# Patient Record
Sex: Female | Born: 1940 | Race: Black or African American | Hispanic: No | Marital: Single | Smoking: Never smoker
Health system: Southern US, Community
[De-identification: ages and names within clinical notes are randomized; demographics above are authoritative.]

## PROBLEM LIST (undated history)

## (undated) DIAGNOSIS — I10 Essential (primary) hypertension: Secondary | ICD-10-CM

## (undated) DIAGNOSIS — M1990 Unspecified osteoarthritis, unspecified site: Secondary | ICD-10-CM

## (undated) DIAGNOSIS — E785 Hyperlipidemia, unspecified: Secondary | ICD-10-CM

## (undated) DIAGNOSIS — E079 Disorder of thyroid, unspecified: Secondary | ICD-10-CM

## (undated) DIAGNOSIS — M810 Age-related osteoporosis without current pathological fracture: Secondary | ICD-10-CM

## (undated) DIAGNOSIS — M1A9XX0 Chronic gout, unspecified, without tophus (tophi): Secondary | ICD-10-CM

## (undated) HISTORY — DX: Age-related osteoporosis without current pathological fracture: M81.0

## (undated) HISTORY — DX: Essential (primary) hypertension: I10

## (undated) HISTORY — DX: Unspecified osteoarthritis, unspecified site: M19.90

## (undated) HISTORY — DX: Hyperlipidemia, unspecified: E78.5

## (undated) HISTORY — DX: Disorder of thyroid, unspecified: E07.9

## (undated) HISTORY — DX: Chronic gout, unspecified, without tophus (tophi): M1A.9XX0

---

## 2002-03-26 HISTORY — PX: LUNG SURGERY: SHX703

## 2010-10-21 ENCOUNTER — Emergency Department: Payer: Self-pay | Admitting: Internal Medicine

## 2013-02-06 DIAGNOSIS — E039 Hypothyroidism, unspecified: Secondary | ICD-10-CM | POA: Insufficient documentation

## 2013-02-06 DIAGNOSIS — E038 Other specified hypothyroidism: Secondary | ICD-10-CM | POA: Insufficient documentation

## 2013-07-09 ENCOUNTER — Emergency Department: Payer: Self-pay | Admitting: Emergency Medicine

## 2013-07-09 LAB — URINALYSIS, COMPLETE
Blood: NEGATIVE
Glucose,UR: NEGATIVE mg/dL (ref 0–75)
Hyaline Cast: 125
Leukocyte Esterase: NEGATIVE
Nitrite: NEGATIVE
Ph: 5 (ref 4.5–8.0)
Protein: 100
RBC,UR: 2 /HPF (ref 0–5)
Specific Gravity: 1.026 (ref 1.003–1.030)
Squamous Epithelial: 30
WBC UR: 11 /HPF (ref 0–5)

## 2013-07-10 LAB — URINE CULTURE

## 2013-08-21 ENCOUNTER — Inpatient Hospital Stay: Payer: Self-pay | Admitting: Internal Medicine

## 2013-08-21 LAB — CBC WITH DIFFERENTIAL/PLATELET
Bands: 2 %
Comment - H1-Com1: NORMAL
Comment - H1-Com2: NORMAL
HCT: 40.7 % (ref 35.0–47.0)
HGB: 12.6 g/dL (ref 12.0–16.0)
Lymphocytes: 9 %
MCH: 25.3 pg — ABNORMAL LOW (ref 26.0–34.0)
MCHC: 30.9 g/dL — ABNORMAL LOW (ref 32.0–36.0)
MCV: 82 fL (ref 80–100)
Monocytes: 10 %
Platelet: 406 10*3/uL (ref 150–440)
RBC: 4.97 10*6/uL (ref 3.80–5.20)
RDW: 15.1 % — ABNORMAL HIGH (ref 11.5–14.5)
Segmented Neutrophils: 79 %
WBC: 23.5 10*3/uL — ABNORMAL HIGH (ref 3.6–11.0)

## 2013-08-21 LAB — URINALYSIS, COMPLETE
Bilirubin,UR: NEGATIVE
Blood: NEGATIVE
Glucose,UR: NEGATIVE mg/dL (ref 0–75)
Hyaline Cast: 10
Ketone: NEGATIVE
Leukocyte Esterase: NEGATIVE
Nitrite: NEGATIVE
Ph: 5 (ref 4.5–8.0)
Protein: NEGATIVE
RBC,UR: 24 /HPF (ref 0–5)
Specific Gravity: 1.018 (ref 1.003–1.030)
Squamous Epithelial: 3
WBC UR: 12 /HPF (ref 0–5)

## 2013-08-21 LAB — COMPREHENSIVE METABOLIC PANEL
Albumin: 2.5 g/dL — ABNORMAL LOW (ref 3.4–5.0)
Alkaline Phosphatase: 97 U/L
Anion Gap: 15 (ref 7–16)
BUN: 81 mg/dL — ABNORMAL HIGH (ref 7–18)
Bilirubin,Total: 1.1 mg/dL — ABNORMAL HIGH (ref 0.2–1.0)
Calcium, Total: 10.3 mg/dL — ABNORMAL HIGH (ref 8.5–10.1)
Chloride: 105 mmol/L (ref 98–107)
Co2: 21 mmol/L (ref 21–32)
Creatinine: 1.85 mg/dL — ABNORMAL HIGH (ref 0.60–1.30)
EGFR (African American): 31 — ABNORMAL LOW
EGFR (Non-African Amer.): 27 — ABNORMAL LOW
Glucose: 173 mg/dL — ABNORMAL HIGH (ref 65–99)
Osmolality: 310 (ref 275–301)
Potassium: 3.8 mmol/L (ref 3.5–5.1)
SGOT(AST): 43 U/L — ABNORMAL HIGH (ref 15–37)
SGPT (ALT): 40 U/L (ref 12–78)
Sodium: 141 mmol/L (ref 136–145)
Total Protein: 8 g/dL (ref 6.4–8.2)

## 2013-08-21 LAB — CK TOTAL AND CKMB (NOT AT ARMC)
CK, Total: 442 U/L — ABNORMAL HIGH
CK-MB: 1.5 ng/mL (ref 0.5–3.6)

## 2013-08-21 LAB — APTT: Activated PTT: 25.4 secs (ref 23.6–35.9)

## 2013-08-21 LAB — PROTIME-INR
INR: 1.1
Prothrombin Time: 14.3 secs (ref 11.5–14.7)

## 2013-08-21 LAB — TROPONIN I: Troponin-I: <0.02 ng/mL

## 2013-08-22 LAB — CBC WITH DIFFERENTIAL/PLATELET
Basophil #: 0 10*3/uL (ref 0.0–0.1)
Basophil %: 0.2 %
Eosinophil #: 0.1 10*3/uL (ref 0.0–0.7)
Eosinophil %: 0.3 %
HCT: 33.3 % — ABNORMAL LOW (ref 35.0–47.0)
HGB: 10.4 g/dL — ABNORMAL LOW (ref 12.0–16.0)
Lymphocyte #: 2.4 10*3/uL (ref 1.0–3.6)
Lymphocyte %: 13.4 %
MCH: 25.3 pg — ABNORMAL LOW (ref 26.0–34.0)
MCHC: 31.2 g/dL — ABNORMAL LOW (ref 32.0–36.0)
MCV: 81 fL (ref 80–100)
Monocyte #: 1.9 x10 3/mm — ABNORMAL HIGH (ref 0.2–0.9)
Monocyte %: 10.4 %
Neutrophil #: 13.5 10*3/uL — ABNORMAL HIGH (ref 1.4–6.5)
Neutrophil %: 75.7 %
Platelet: 312 10*3/uL (ref 150–440)
RBC: 4.11 10*6/uL (ref 3.80–5.20)
RDW: 14.7 % — ABNORMAL HIGH (ref 11.5–14.5)
WBC: 17.8 10*3/uL — ABNORMAL HIGH (ref 3.6–11.0)

## 2013-08-22 LAB — BASIC METABOLIC PANEL
Anion Gap: 8 (ref 7–16)
BUN: 55 mg/dL — ABNORMAL HIGH (ref 7–18)
Calcium, Total: 9 mg/dL (ref 8.5–10.1)
Chloride: 116 mmol/L — ABNORMAL HIGH (ref 98–107)
Co2: 23 mmol/L (ref 21–32)
Creatinine: 1.23 mg/dL (ref 0.60–1.30)
EGFR (African American): 51 — ABNORMAL LOW
EGFR (Non-African Amer.): 44 — ABNORMAL LOW
Glucose: 100 mg/dL — ABNORMAL HIGH (ref 65–99)
Osmolality: 308 (ref 275–301)
Potassium: 3.6 mmol/L (ref 3.5–5.1)
Sodium: 147 mmol/L — ABNORMAL HIGH (ref 136–145)

## 2013-08-22 LAB — LIPID PANEL
Cholesterol: 105 mg/dL (ref 0–200)
HDL Cholesterol: 19 mg/dL — ABNORMAL LOW (ref 40–60)
Ldl Cholesterol, Calc: 62 mg/dL (ref 0–100)
Triglycerides: 122 mg/dL (ref 0–200)
VLDL Cholesterol, Calc: 24 mg/dL (ref 5–40)

## 2013-08-22 LAB — TSH: Thyroid Stimulating Horm: 1.42 u[IU]/mL

## 2013-08-22 LAB — HEMOGLOBIN A1C: Hemoglobin A1C: 6.1 % (ref 4.2–6.3)

## 2013-08-23 LAB — BASIC METABOLIC PANEL
Anion Gap: 8 (ref 7–16)
BUN: 23 mg/dL — ABNORMAL HIGH (ref 7–18)
Calcium, Total: 8.8 mg/dL (ref 8.5–10.1)
Chloride: 109 mmol/L — ABNORMAL HIGH (ref 98–107)
Co2: 24 mmol/L (ref 21–32)
Creatinine: 1.06 mg/dL (ref 0.60–1.30)
EGFR (African American): >60
EGFR (Non-African Amer.): 53 — ABNORMAL LOW
Glucose: 103 mg/dL — ABNORMAL HIGH (ref 65–99)
Osmolality: 285 (ref 275–301)
Potassium: 3.1 mmol/L — ABNORMAL LOW (ref 3.5–5.1)
Sodium: 141 mmol/L (ref 136–145)

## 2013-08-23 LAB — URINE CULTURE

## 2013-08-24 LAB — BASIC METABOLIC PANEL
Anion Gap: 4 — ABNORMAL LOW (ref 7–16)
BUN: 9 mg/dL (ref 7–18)
Calcium, Total: 8.6 mg/dL (ref 8.5–10.1)
Chloride: 108 mmol/L — ABNORMAL HIGH (ref 98–107)
Co2: 26 mmol/L (ref 21–32)
Creatinine: 0.95 mg/dL (ref 0.60–1.30)
EGFR (African American): >60
EGFR (Non-African Amer.): >60
Glucose: 107 mg/dL — ABNORMAL HIGH (ref 65–99)
Osmolality: 275 (ref 275–301)
Potassium: 3.5 mmol/L (ref 3.5–5.1)
Sodium: 138 mmol/L (ref 136–145)

## 2013-08-24 LAB — MAGNESIUM: Magnesium: 1.5 mg/dL — ABNORMAL LOW

## 2013-08-26 LAB — WOUND CULTURE

## 2013-08-26 LAB — CULTURE, BLOOD (SINGLE)

## 2013-08-30 LAB — WOUND CULTURE

## 2013-08-31 ENCOUNTER — Other Ambulatory Visit: Payer: Self-pay | Admitting: Family Medicine

## 2013-08-31 LAB — CBC WITH DIFFERENTIAL/PLATELET
Basophil #: 0.1 10*3/uL (ref 0.0–0.1)
Basophil %: 0.5 %
Eosinophil #: 0.2 10*3/uL (ref 0.0–0.7)
Eosinophil %: 2.1 %
HCT: 38.1 % (ref 35.0–47.0)
HGB: 11.7 g/dL — ABNORMAL LOW (ref 12.0–16.0)
Lymphocyte #: 2.5 10*3/uL (ref 1.0–3.6)
Lymphocyte %: 23.4 %
MCH: 26.3 pg (ref 26.0–34.0)
MCHC: 30.7 g/dL — ABNORMAL LOW (ref 32.0–36.0)
MCV: 86 fL (ref 80–100)
Monocyte #: 0.8 x10 3/mm (ref 0.2–0.9)
Monocyte %: 7.9 %
Neutrophil #: 6.9 10*3/uL — ABNORMAL HIGH (ref 1.4–6.5)
Neutrophil %: 66.1 %
Platelet: 180 10*3/uL (ref 150–440)
RBC: 4.45 10*6/uL (ref 3.80–5.20)
RDW: 17.2 % — ABNORMAL HIGH (ref 11.5–14.5)
WBC: 10.5 10*3/uL (ref 3.6–11.0)

## 2013-08-31 LAB — COMPREHENSIVE METABOLIC PANEL
Albumin: 2.7 g/dL — ABNORMAL LOW (ref 3.4–5.0)
Alkaline Phosphatase: 81 U/L
Anion Gap: 9 (ref 7–16)
BUN: 7 mg/dL (ref 7–18)
Bilirubin,Total: 0.5 mg/dL (ref 0.2–1.0)
Calcium, Total: 8.8 mg/dL (ref 8.5–10.1)
Chloride: 106 mmol/L (ref 98–107)
Co2: 25 mmol/L (ref 21–32)
Creatinine: 0.95 mg/dL (ref 0.60–1.30)
EGFR (African American): >60
EGFR (Non-African Amer.): >60
Glucose: 136 mg/dL — ABNORMAL HIGH (ref 65–99)
Osmolality: 279 (ref 275–301)
Potassium: 3.9 mmol/L (ref 3.5–5.1)
SGOT(AST): 31 U/L (ref 15–37)
SGPT (ALT): 30 U/L (ref 12–78)
Sodium: 140 mmol/L (ref 136–145)
Total Protein: 6.5 g/dL (ref 6.4–8.2)

## 2013-08-31 LAB — URINALYSIS, COMPLETE
Bilirubin,UR: NEGATIVE
Blood: NEGATIVE
Glucose,UR: NEGATIVE mg/dL (ref 0–75)
Hyaline Cast: 5
Ketone: NEGATIVE
Leukocyte Esterase: NEGATIVE
Nitrite: NEGATIVE
Ph: 5 (ref 4.5–8.0)
Protein: NEGATIVE
RBC,UR: 3 /HPF (ref 0–5)
Specific Gravity: 1.008 (ref 1.003–1.030)
Squamous Epithelial: 6
WBC UR: 5 /HPF (ref 0–5)

## 2013-08-31 LAB — HEMOGLOBIN A1C: Hemoglobin A1C: 5.6 % (ref 4.2–6.3)

## 2013-08-31 LAB — MAGNESIUM: Magnesium: 1.8 mg/dL

## 2013-09-02 LAB — URINE CULTURE

## 2013-09-15 ENCOUNTER — Other Ambulatory Visit: Payer: Self-pay

## 2013-09-15 LAB — COMPREHENSIVE METABOLIC PANEL
Albumin: 3 g/dL — ABNORMAL LOW (ref 3.4–5.0)
Alkaline Phosphatase: 82 U/L
Anion Gap: 11 (ref 7–16)
BUN: 10 mg/dL (ref 7–18)
Bilirubin,Total: 0.5 mg/dL (ref 0.2–1.0)
Calcium, Total: 8.9 mg/dL (ref 8.5–10.1)
Chloride: 108 mmol/L — ABNORMAL HIGH (ref 98–107)
Co2: 23 mmol/L (ref 21–32)
Creatinine: 0.89 mg/dL (ref 0.60–1.30)
EGFR (African American): >60
EGFR (Non-African Amer.): >60
Glucose: 123 mg/dL — ABNORMAL HIGH (ref 65–99)
Osmolality: 284 (ref 275–301)
Potassium: 3.7 mmol/L (ref 3.5–5.1)
SGOT(AST): 22 U/L (ref 15–37)
SGPT (ALT): 20 U/L (ref 12–78)
Sodium: 142 mmol/L (ref 136–145)
Total Protein: 6.3 g/dL — ABNORMAL LOW (ref 6.4–8.2)

## 2013-09-15 LAB — MAGNESIUM: Magnesium: 2.2 mg/dL

## 2013-09-15 LAB — URIC ACID: Uric Acid: 10.2 mg/dL — ABNORMAL HIGH (ref 2.6–6.0)

## 2014-03-26 HISTORY — PX: COLONOSCOPY: SHX174

## 2014-03-26 LAB — HM PAP SMEAR: HM Pap smear: NORMAL

## 2014-05-07 LAB — CBC AND DIFFERENTIAL
HCT: 43 % (ref 36–46)
Hemoglobin: 13.9 g/dL (ref 12.0–16.0)
Neutrophils Absolute: 67 /uL
Platelets: 196 10*3/uL (ref 150–399)
WBC: 8.9 10^3/mL

## 2014-05-07 LAB — HEPATIC FUNCTION PANEL
ALT: 11 U/L (ref 7–35)
AST: 19 U/L (ref 13–35)
Alkaline Phosphatase: 86 U/L (ref 25–125)
Bilirubin, Total: 0.7 mg/dL

## 2014-05-07 LAB — BASIC METABOLIC PANEL
BUN: 15 mg/dL (ref 4–21)
Creatinine: 0.9 mg/dL (ref 0.5–1.1)
Glucose: 123 mg/dL
Potassium: 4.2 mmol/L (ref 3.4–5.3)
Sodium: 141 mmol/L (ref 137–147)

## 2014-05-07 LAB — HEMOGLOBIN A1C: Hgb A1c MFr Bld: 5.6 % (ref 4.0–6.0)

## 2014-05-07 LAB — TSH: TSH: 3.18 u[IU]/mL (ref 0.41–5.90)

## 2014-06-02 ENCOUNTER — Ambulatory Visit: Payer: Self-pay | Admitting: Family Medicine

## 2014-06-02 LAB — HM MAMMOGRAPHY: HM Mammogram: NORMAL

## 2014-06-02 LAB — HM DEXA SCAN: HM Dexa Scan: -2.9

## 2014-07-12 DIAGNOSIS — K5909 Other constipation: Secondary | ICD-10-CM

## 2014-07-12 DIAGNOSIS — L853 Xerosis cutis: Secondary | ICD-10-CM | POA: Insufficient documentation

## 2014-07-12 DIAGNOSIS — E041 Nontoxic single thyroid nodule: Secondary | ICD-10-CM | POA: Insufficient documentation

## 2014-07-12 DIAGNOSIS — H5789 Other specified disorders of eye and adnexa: Secondary | ICD-10-CM | POA: Insufficient documentation

## 2014-07-12 DIAGNOSIS — B372 Candidiasis of skin and nail: Secondary | ICD-10-CM

## 2014-07-12 DIAGNOSIS — M1A00X0 Idiopathic chronic gout, unspecified site, without tophus (tophi): Secondary | ICD-10-CM | POA: Insufficient documentation

## 2014-07-12 DIAGNOSIS — I10 Essential (primary) hypertension: Secondary | ICD-10-CM | POA: Insufficient documentation

## 2014-07-12 DIAGNOSIS — R29898 Other symptoms and signs involving the musculoskeletal system: Secondary | ICD-10-CM

## 2014-07-12 DIAGNOSIS — E040 Nontoxic diffuse goiter: Secondary | ICD-10-CM

## 2014-07-12 DIAGNOSIS — M810 Age-related osteoporosis without current pathological fracture: Secondary | ICD-10-CM | POA: Insufficient documentation

## 2014-07-12 DIAGNOSIS — E049 Nontoxic goiter, unspecified: Secondary | ICD-10-CM

## 2014-07-13 ENCOUNTER — Ambulatory Visit
Admit: 2014-07-13 | Disposition: A | Discharge status: Home / Self Care | Payer: Self-pay | Attending: Gastroenterology | Admitting: Gastroenterology

## 2014-07-17 NOTE — Discharge Summary (Signed)
PATIENT NAME:  Tracy Bolton, Tracy Bolton MR#:  [id number] DATE OF BIRTH:  1940-11-05  DATE OF ADMISSION:  08/21/2013 DATE OF DISCHARGE:  08/24/2013  ADMISSION DIAGNOSIS:  1.  Sepsis secondary to underlying skin infection.  2.  Acute kidney injury.  3.  Yeast infection.   DISCHARGE DIAGNOSES:  1.  Sepsis from superimposed infection on top of candida.  2.  Acute kidney injury. 3.  Yeast infection.  4.  Elevated blood sugars.  5.  Rhabdomyolysis, mild, after falling.  6.  Hypernatremia, improved.  7.  Weakness/falls.  8.  Hypomagnesemia.   LABORATORY: Sodium 138, potassium 3.5, chloride 108, bicarbonate 26, BUN 9, creatinine 0.95. Wound cultures are so far positive for gram-negative rods, which the lab says is pseudomonas and anaerobes. Blood cultures negative.   HOSPITAL COURSE: A 74 year old female, who was found by EMS after a fall unable to get up. For further details, please refer to the H and P.  1.  Sepsis. The patient has primary source of superimposed infection on top of Candida infection. The criteria for sepsis on admission was tachycardia and elevation of white blood cell count with this infection. She was placed on Rocephin and fluconazole. She did well with this. Her white blood cell count improved. She was afebrile throughout hospitalization. Cultures were positive for pseudomonas and some anaerobes. She will be discharged with fluoroquinolone as well as clindamycin.  2.  Acute kidney injury, improved with IV fluids. 3.  Yeast infection. The patient was on Diflucan. She will continue with nystatin powder.  4.  Elevated blood sugar with A1c of 6.1. She is not diabetic, but may meet standards for pre-diabetes and will need close followup.  5.  Rhabdomyolysis, mild. Mild elevation of CK on admission. She was hydrated and this is improved.  6.  Hypernatremia. The patient was not taking adequate p.o. She was placed on D5. Sodium is improved.  7.  Weakness and falls. The patient does need  rehab at discharge.  8.  Hypomagnesemia, which was repleted prior to discharge.   DISCHARGE MEDICATIONS:  1.  Atenolol 100 mg daily. 2.  Ciprofloxacin 500 mg q.12 hours.  3.  Cleomycin 300 mg q.8 hours, stop on 06/10.  4.  Nystatin 1 application t.i.d. 5.  Senna 1 tablet b.i.d.  6.  Ensure b.i.d.  We have stopped the patient's amlodipine and lisinopril due to low-normal blood pressures.   DISCHARGE DIET: Regular diet.   DISCHARGE ACTIVITY: As tolerated.   DISCHARGE DIETARY SUPPLEMENT: Ensure b.i.d.   DISCHARGE REFERRAL: Physical therapy.  DISCHARGE COMMENTS: Check blood pressure daily. Adjust medications accordingly. Stop antibiotics on 09/02/2013.   FOLLOWUP: The patient needs to follow up with the M.D. at the facility.   CONDITION: The patient is medically stable for discharge to rehab and is medically ready.   TIME SPENT: 45 minutes on discharge.   ____________________________ Sital P. Benjie Karvonen, MD spm:aw D: 08/24/2013 11:26:21 ET T: 08/24/2013 11:33:26 ET JOB#: [id number]  cc: Sital P. Benjie Karvonen, MD, <Dictator> Donell Beers MODY MD ELECTRONICALLY SIGNED 08/24/2013 12:13

## 2014-07-17 NOTE — H&P (Signed)
PATIENT NAME:  Tracy Bolton, DEGRAZIA MR#:  [id number] DATE OF BIRTH:  [birth year]  DATE OF ADMISSION:  08/21/2013  PRIMARY CARE PHYSICIAN: Nonlocal.   REFERRING PHYSICIAN:  Dr. Darl Householder.   CHIEF COMPLAINT:  Fall, unable to get up.   HISTORY OF PRESENT ILLNESS: This is a very nice 74 year old female with history of hypertension and obesity. She had a urinary tract infection treated a couple of weeks ago, but overall she has been in her normal state of health. The patient comes today with a history of falling on Tuesday and having difficulty getting up, and once she got up, she was able to ambulate. Then, last night she fell in the early evening. She was not able to get up because she felt too weak and stent all night on the floor, and this morning woke up still on the floor. She was not able to move until the police arrived to her apartment.   Apparently, her daughter called the police as she calling her on the phone and she never answered. The patient states that she has been okay. She denies seeing any rashes on her skin, but she has multiple areas under the breasts and in between the groins secondary to intertrigo. The patient says that she is very good with her hygiene and always takes a shower everyday, but still has never noticed any infection. Never noticed any significant body odor as well. The patient comes to the Emergency Department where she is found out to be hyperglycemic with acute kidney injury and total CK of 442. White count of 23,000. The patient meets criteria for sepsis based on tachycardia and white blood cells, but she is afebrile. The patient is admitted for treatment of this condition.   REVIEW OF SYSTEMS:  A 12 system review of systems is done. CONSTITUTIONAL:  No fever. Positive weakness. Negative significant fatigue, weight loss or weight gain. The patient is morbidly obese.  EYES: No blurry vision, double vision.  ENT:  No difficulty swallowing or tinnitus.  RESPIRATORY: No cough,  shortness of breath, wheezing or COPD.  CARDIOVASCULAR: No chest pain, orthopnea, edema or syncope.  GASTROINTESTINAL: No nausea, vomiting, abdominal pain, constipation or diarrhea.  GENITOURINARY: No dysuria or hematuria.  ENDOCRINE: No polyuria, polydipsia, polyphagia, cold or heat intolerance. The patient has elevated blood sugars, but she was not aware of being diabetic.  HEMATOLOGIC AND LYMPHATICS: No anemia, easy bruising or bleeding.  SKIN: The patient denies any lesions or infections of the skin.  MUSCULOSKELETAL: No neck pain, back pain, shoulder pain or gout.  NEUROLOGIC: No numbness, tingling or CVA.  PSYCHIATRIC:  No insomnia or depression.  PAST MEDICAL HISTORY: 1.  Hypertension.  2.  Obesity.  3.  Recent diagnosis of UTI, but no recurring UTIs.   ALLERGIES: No known drug allergies.   PAST SURGICAL HISTORY:  Lung biopsy, which was for a nodule that was benign.   FAMILY HISTORY: Negative for MI, CVA, cancer or diabetes.   SOCIAL HISTORY: The patient never smoked. Does not drink. She lives alone. She is divorced. She is retired from Teaching laboratory technician work.   CURRENT MEDICATIONS: Include lisinopril 4 mg daily, atenolol 100 mg once a day, amlodipine 10 mg once a day.   PHYSICAL EXAMINATION:  VITAL SIGNS: Blood pressure 107/64, pulse 114, respirations 16, temperature 97.7, oxygen saturation 98% on room air.  GENERAL:  The patient is alert and oriented x 3, in no acute distress. No respiratory distress. Hemodynamically stable.  HEENT: Pupils are equal, round  and reactive. Extraocular movements are intact. Mucosa is dry. Anicteric sclerae. Pink conjunctivae. No oral lesions. No oropharyngeal exudates.  NECK: Supple. No JVD. No thyromegaly. No adenopathy. No carotid bruits.  CARDIOVASCULAR: Regular rate and rhythm, tachycardic. No murmurs, rubs or gallops are appreciated. No tenderness to palpation on anterior chest wall. No displacement of PMI.  LUNGS: Clear without any wheezing or  crepitus. No use of accessory muscles.  ABDOMEN: Soft, nontender, nondistended. No hepatosplenomegaly. No masses. Bowel sounds are positive. No tenderness to palpation. No rebound.   GENITAL: Negative for vaginal secretions. The patient has groin infection, significant yeast infection with significant erythema at the level of the groin and under the breasts with small ulcerations and suppuration  of fluid that looks purulent. No significant abscess.  EXTREMITIES:  No edema, cyanosis or clubbing. Pulses +2. Capillary refill less than 3.  SKIN: As mentioned above.  NEUROLOGIC:  Cranial nerves II through XII intact. Strength is 4/5 in all 4 extremities. No focal findings. DTRs +2.  LYMPHATIC: Negative for lymphadenopathy in supraclavicular areas.  PSYCHIATRIC: No agitation. The patient is alert and oriented x 3.  MUSCULOSKELETAL: No significant joint effusion or joint swelling.   CHEST X-RAY:  Mass deviating the trachea, but no acute disease.   The patient had a normal CT of the head. The chest x-ray mass appears to be a goiter.   HIP: No acute fractures.  KNEE X-RAYS:  No acute abnormality.   LEFT FOOT X-RAY:  Mild soft tissue swelling without any acute abnormality.  EKG: No ST depression or elevation. The patient has normal sinus rhythm, but has prolonged QT at 398   LABORATORY DATA:  White blood count of 23,000, total CK of 442, bilirubin 1.1, AST 43, albumin 2.5, calcium 10.3, creatinine 1.85, glucose 173, BUN 81.   ASSESSMENT AND PLAN: A 74 year old female status post fall, history of hypertension, obesity, admitted for sepsis.  1.  Sepsis. The patient has primary source of superimposed infection on top of candidal infection. Criteria of sepsis, tachycardic at 116 and a white count of 23,000. The patient also has an elevation of white blood count secondary to vascular compression as she has been lying down on the floor for over 24 hours. Treated with Rocephin and Diflucan. Cultures taken  of blood. Cultures taken from the wounds. She also had urinary white blood cells of 12 and 24 on the UA. Rule out urinary tract infection as well.  2.  As far as her fall, the patient is debilitated, likely is going to need some type of rehab. She has a slight case of rhabdomyolysis with a CK around 500 and acute kidney  injury.  3.  Acute kidney injury. Her creatinine is 1.85. At this moment, her blood pressure is borderline low 416S to 063 on systolics. We are going to hold her blood pressure medications, especially  lisinopril since her creatinine is elevated. If no improvement of the creatinine, consider imaging of the abdomen to rule out postobstructive problems.  4.  Yeast infection. This patient is likely diabetic, undiagnosed. We are going to treat her with Diflucan.  5.  Elevated blood sugars, likely undiagnosed diabetic. Hemoglobin A1c checked. Insulin sliding scale. Start on medications after kidney failure resolves. Possible metformin will be a good medication for her.  6.  Hyperglycemia due to vascular concentration, dehydration. Continue to monitor.  7.  Elevation of AST. Again, this could be secondary to the rhabdomyolysis and affecting the liver.  8.  Hypertension. Hold blood  pressure medications.  9.  Gastrointestinal prophylaxis with Protonix.  10.  Deep vein thrombosis prophylaxis with Lovenox.  11. Mass at the level of the neck, likely goiter. Get a CT scan outpatient or before the patient has been discharged. At this moment, the patient is asymptomatic and has been a finding that she has had for several years apparently.  12.  Other medical problems are stable.    ____________________________ Montgomery Sink, MD rsg:dmm D: 08/21/2013 18:31:33 ET T: 08/21/2013 19:23:49 ET JOB#: [id number]  cc:  Sink, MD, <Dictator> ROBERTO America Brown MD ELECTRONICALLY SIGNED 08/29/2013 20:10

## 2014-07-19 LAB — SURGICAL PATHOLOGY

## 2014-09-06 ENCOUNTER — Encounter (INDEPENDENT_AMBULATORY_CARE_PROVIDER_SITE_OTHER): Payer: Self-pay

## 2014-09-06 ENCOUNTER — Ambulatory Visit (INDEPENDENT_AMBULATORY_CARE_PROVIDER_SITE_OTHER): Payer: Medicare Other | Admitting: Family Medicine

## 2014-09-06 ENCOUNTER — Encounter: Payer: Self-pay | Admitting: Family Medicine

## 2014-09-06 VITALS — BP 122/74 | HR 82 | Temp 98.8°F | Resp 18 | Ht 63.0 in | Wt 244.4 lb

## 2014-09-06 DIAGNOSIS — M1A9XX0 Chronic gout, unspecified, without tophus (tophi): Secondary | ICD-10-CM | POA: Insufficient documentation

## 2014-09-06 DIAGNOSIS — I10 Essential (primary) hypertension: Secondary | ICD-10-CM | POA: Diagnosis not present

## 2014-09-06 DIAGNOSIS — B372 Candidiasis of skin and nail: Secondary | ICD-10-CM

## 2014-09-06 DIAGNOSIS — M1A00X0 Idiopathic chronic gout, unspecified site, without tophus (tophi): Secondary | ICD-10-CM | POA: Insufficient documentation

## 2014-09-06 DIAGNOSIS — M810 Age-related osteoporosis without current pathological fracture: Secondary | ICD-10-CM | POA: Diagnosis not present

## 2014-09-06 MED ORDER — NYAMYC 100000 UNIT/GM EX POWD: CUTANEOUS | Status: DC

## 2014-09-06 NOTE — Progress Notes (Signed)
Name: Tracy Bolton   MRN: [id number]    DOB: [birth year]   Date:09/06/2014       Progress Note  Subjective  Chief Complaint  Chief Complaint  Patient presents with  . Osteoporosis    patient is here for a follow-up   . Hypertension    HPI  Patient is here for routine follow up of Hypertension. First diagnosed with hypertension several years ago. Current anti-hypertension medication regimen includes dietary modification, weight management and amlodipine 10mg  one a day, lisinopril 40mg  one a day. atient is following physician recommended management. No checking blood pressure outside of physician office. Associated symptoms do not include headache, dizziness, nausea, lower extremity swelling, shortness of breath, chest pain, numbness.  Osteoporosis: Patient complains of osteoporosis. She was diagnosed with osteoporosis by bone density scan in 06/02/2014 denies history of fracture.The cause of osteoporosis is felt to be due to postmenopausal estrogen deficiency and dietary calcium and/or vitamin D deficiency.   She is currently being treated with calcium and vitamin D supplementation.  She is currently being treated with bisphosphonates.  Skin Rash Improving with nystatin powder use. Needs refill of larger size.   Past Medical History  Diagnosis Date  . Hyperlipidemia   . Arthritis   . Hypertension   . Thyroid disease   . Osteoporosis   . Gout, chronic     Past Surgical History  Procedure Laterality Date  . Lung surgery Right 2004    biopsy normal    Family History  Problem Relation Age of Onset  . Bone cancer Father   . Thyroid disease Sister     History   Social History  . Marital Status: Single    Spouse Name: N/A  . Number of Children: 2  . Years of Education: N/A   Occupational History  . retired    Social History Main Topics  . Smoking status: Never Smoker   . Smokeless tobacco: Not on file  . Alcohol Use: No  . Drug Use: No  . Sexual Activity: No    Other Topics Concern  . Not on file   Social History Narrative     Current outpatient prescriptions:  .  alendronate (FOSAMAX) 70 MG tablet, Take 70 mg by mouth., Disp: , Rfl:  .  allopurinol (ZYLOPRIM) 100 MG tablet, Take by mouth., Disp: , Rfl:  .  amLODipine (NORVASC) 10 MG tablet, Take 10 mg by mouth., Disp: , Rfl:  .  aspirin 81 MG tablet, Take 81 mg by mouth., Disp: , Rfl:  .  Calcium Carbonate-Vitamin D 600-400 MG-UNIT per tablet, Take by mouth., Disp: , Rfl:  .  Cholecalciferol (VITAMIN D3) 50000 UNITS CAPS, Take by mouth., Disp: , Rfl:  .  docusate sodium (COLACE) 100 MG capsule, Take by mouth., Disp: , Rfl:  .  lisinopril (PRINIVIL,ZESTRIL) 40 MG tablet, Take 40 mg by mouth., Disp: , Rfl:  .  Multiple Vitamin (MULTI-VITAMINS) TABS, Take by mouth., Disp: , Rfl:  .  Nystatin ([place]) 100000 UNIT/GM POWD, [place], 100000 UNIT/GM (External Powder)  1 (one) Powder three times daily to affected areas of skin for 30 days  Quantity: 1;  Refills: 2   Ordered :07-May-2014  Bobetta Lime MD;  Started 07-May-2014 Active, Disp: , Rfl:  .  senna (SENOKOT) 8.6 MG tablet, Take 1 tablet by mouth 2 (two) times daily., Disp: , Rfl:  .  senna (SENOKOT) 8.6 MG tablet, Take by mouth., Disp: , Rfl:  .  [place], See admin  instructions., Disp: , Rfl: 0 .  atenolol (TENORMIN) 50 MG tablet, Take 50 mg by mouth daily., Disp: , Rfl: 1  No Known Allergies   ROS  CONSTITUTIONAL: No significant weight changes, fever, chills, weakness or fatigue.  HEENT:  - Eyes: No visual changes.  - Ears: No auditory changes. No pain.  - Nose: No sneezing, congestion, runny nose. - Throat: No sore throat. No changes in swallowing. SKIN: No rash or itching.  CARDIOVASCULAR: No chest pain, chest pressure or chest discomfort. No palpitations or edema.  RESPIRATORY: No shortness of breath, cough or sputum.  GASTROINTESTINAL: No anorexia, nausea, vomiting. No changes in bowel habits. No abdominal pain  or blood.  GENITOURINARY: No dysuria. No frequency. No discharge.  NEUROLOGICAL: No headache, dizziness, syncope, paralysis, ataxia, numbness or tingling in the extremities. No memory changes. No change in bowel or bladder control.  MUSCULOSKELETAL: No joint pain. No muscle pain. HEMATOLOGIC: No anemia, bleeding or bruising.  LYMPHATICS: No enlarged lymph nodes.  PSYCHIATRIC: No change in mood. No change in sleep pattern.  ENDOCRINOLOGIC: No reports of sweating, cold or heat intolerance. No polyuria or polydipsia.   Objective  Filed Vitals:   09/06/14 1020  BP: 122/74  Pulse: 82  Temp: 98.8 F (37.1 C)  TempSrc: Oral  Resp: 18  Height: 5\' 3"  (1.6 m)  Weight: 244 lb 6.4 oz (110.859 kg)  SpO2: 97%    Physical Exam  Constitutional: Patient is obese and well-nourished. In no distress. [place] with use of her walker per her baseline. HEENT:  - Head: Normocephalic and atraumatic.  - Ears: Bilateral TMs gray, no erythema or effusion - Nose: Nasal mucosa moist - Mouth/Throat: Oropharynx is clear and moist. No tonsillar hypertrophy or erythema. No post nasal drainage.  - Eyes: Conjunctivae clear, EOM movements normal. PERRLA. No scleral icterus.  Neck: Normal range of motion. Neck supple. No JVD present. Right sided thyromegaly present and stable. Cardiovascular: Normal rate, regular rhythm and normal heart sounds.  No murmur heard.  Pulmonary/Chest: Effort normal and breath sounds normal. No respiratory distress. Musculoskeletal: Normal range of motion bilateral UE and LE, no joint effusions. Peripheral vascular: Bilateral LE no edema. Neurological: Bilateral LE weakness stable 4/5 strength bilaterally. She is oriented to person, place, and time.  Skin: Skin is warm and dry. Rash under folds of skin improved. Psychiatric: Patient has a normal mood and affect. Behavior is normal in office today. Judgment and thought content normal in office today.   Assessment & Plan  1.  Hypertension goal BP (blood pressure) < 130/80 Well controled continue current medications.  2. 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 Obesity, Class III, BMI 40-49.9 (morbid obesity) Difficult for her to be active to help lose weight. Be mindful of diet.  4. Skin yeast infection Refill  - Nystatin ([place]) 100000 UNIT/GM POWD; Use sufficient amount TID on affected areas of skin  Dispense: 60 g; Refill: 2

## 2014-09-10 ENCOUNTER — Telehealth: Payer: Self-pay | Admitting: Family Medicine

## 2014-09-10 DIAGNOSIS — I10 Essential (primary) hypertension: Secondary | ICD-10-CM

## 2014-09-10 NOTE — Telephone Encounter (Signed)
Patient called stating when she was in the office earlier this week she told Dr Nadine Counts that she was on Lisinopril. Patient states she is not on Lisinopril but she is on Atnenol 50 mg and Amlodipine 10 mg.

## 2014-09-13 MED ORDER — AMLODIPINE BESYLATE 10 MG PO TABS: 10.0000 mg | ORAL_TABLET | Freq: Every day | ORAL | Status: DC

## 2014-09-13 MED ORDER — ATENOLOL 50 MG PO TABS: 50.0000 mg | ORAL_TABLET | Freq: Every day | ORAL | Status: DC

## 2014-09-13 NOTE — Telephone Encounter (Signed)
Medications updated accordingly.

## 2014-10-18 ENCOUNTER — Other Ambulatory Visit: Payer: Self-pay | Admitting: Family Medicine

## 2014-10-21 ENCOUNTER — Other Ambulatory Visit: Payer: Self-pay | Admitting: Family Medicine

## 2014-10-21 DIAGNOSIS — I10 Essential (primary) hypertension: Secondary | ICD-10-CM

## 2014-10-21 DIAGNOSIS — M1000 Idiopathic gout, unspecified site: Secondary | ICD-10-CM

## 2015-01-15 ENCOUNTER — Other Ambulatory Visit: Payer: Self-pay | Admitting: Family Medicine

## 2015-01-17 ENCOUNTER — Other Ambulatory Visit: Payer: Self-pay | Admitting: Family Medicine

## 2015-01-17 DIAGNOSIS — M1000 Idiopathic gout, unspecified site: Secondary | ICD-10-CM

## 2015-01-17 NOTE — Telephone Encounter (Signed)
Pt requesting refill on stool softner, Allopurinol to be sent to [facility]

## 2015-01-17 NOTE — Telephone Encounter (Signed)
Refill request was sent to Dr. Bobetta Lime for approval and submission.  Dose, Route, Frequency: As Directed   Dispense Quantity:  90 tablet Refills:  2 Fills Remaining:  2        Sig: TAKE 1 TABLET EVERY DAY       Written Date:  10/21/14 Expiration Date:  10/21/15    Start Date:  10/21/14 End Date:  --    Ordering Provider:  -- Authorizing Provider:  Bobetta Lime, MD Ordering User:  Bobetta Lime, MD        Diagnosis Association: Idiopathic gout, unspecified chronicity, unspecified site (274.9)

## 2015-01-18 MED ORDER — ALLOPURINOL 100 MG PO TABS: 100.0000 mg | ORAL_TABLET | Freq: Every day | ORAL | Status: DC

## 2015-01-18 MED ORDER — SENNOSIDES 8.6 MG PO TABS: 1.0000 | ORAL_TABLET | Freq: Two times a day (BID) | ORAL | Status: DC

## 2015-03-06 ENCOUNTER — Other Ambulatory Visit: Payer: Self-pay | Admitting: Family Medicine

## 2015-05-10 ENCOUNTER — Encounter: Payer: Medicare Other | Admitting: Family Medicine

## 2015-05-18 ENCOUNTER — Encounter: Payer: Self-pay | Admitting: Family Medicine

## 2015-05-18 ENCOUNTER — Ambulatory Visit (INDEPENDENT_AMBULATORY_CARE_PROVIDER_SITE_OTHER): Payer: Medicare Other | Admitting: Family Medicine

## 2015-05-18 VITALS — BP 106/58 | HR 98 | Temp 98.0°F | Resp 14 | Wt 263.0 lb

## 2015-05-18 DIAGNOSIS — E041 Nontoxic single thyroid nodule: Secondary | ICD-10-CM | POA: Diagnosis not present

## 2015-05-18 DIAGNOSIS — R29898 Other symptoms and signs involving the musculoskeletal system: Secondary | ICD-10-CM

## 2015-05-18 DIAGNOSIS — E038 Other specified hypothyroidism: Secondary | ICD-10-CM | POA: Diagnosis not present

## 2015-05-18 DIAGNOSIS — I10 Essential (primary) hypertension: Secondary | ICD-10-CM | POA: Diagnosis not present

## 2015-05-18 DIAGNOSIS — E034 Atrophy of thyroid (acquired): Secondary | ICD-10-CM | POA: Diagnosis not present

## 2015-05-18 DIAGNOSIS — Z23 Encounter for immunization: Secondary | ICD-10-CM | POA: Diagnosis not present

## 2015-05-18 DIAGNOSIS — Z0000 Encounter for general adult medical examination without abnormal findings: Secondary | ICD-10-CM | POA: Diagnosis not present

## 2015-05-18 DIAGNOSIS — R739 Hyperglycemia, unspecified: Secondary | ICD-10-CM | POA: Diagnosis not present

## 2015-05-18 DIAGNOSIS — B372 Candidiasis of skin and nail: Secondary | ICD-10-CM

## 2015-05-18 DIAGNOSIS — M810 Age-related osteoporosis without current pathological fracture: Secondary | ICD-10-CM

## 2015-05-18 DIAGNOSIS — R7303 Prediabetes: Secondary | ICD-10-CM | POA: Insufficient documentation

## 2015-05-18 MED ORDER — AMLODIPINE BESYLATE 10 MG PO TABS: 10.0000 mg | ORAL_TABLET | Freq: Every day | ORAL | Status: DC

## 2015-05-18 MED ORDER — ATENOLOL 50 MG PO TABS: 50.0000 mg | ORAL_TABLET | Freq: Every day | ORAL | Status: DC

## 2015-05-18 MED ORDER — NYAMYC 100000 UNIT/GM EX POWD: CUTANEOUS | Status: DC

## 2015-05-18 NOTE — Progress Notes (Signed)
Name: Tracy Bolton   MRN: [id number]    DOB: 1940/08/12   Date:05/18/2015       Progress Note  Subjective  Chief Complaint  Chief Complaint  Patient presents with  . Annual Exam    HPI  Patient is here today for a Female Medicare Wellness Visit and chronic medical conditions:  Patient is here for routine follow up of Hypertension. First diagnosed with hypertension several years ago. Current anti-hypertension medication regimen includes dietary modification, weight management and amlodipine 10mg  one a day, atenolol 50mg  one a day (Lisinopril discontinued due to CKD). Patient is following physician recommended management. No checking blood pressure outside of physician office. Associated symptoms do not include headache, dizziness, nausea, lower extremity swelling, shortness of breath, chest pain, numbness. Has yearly follow up with Nephrologist Dr. Juleen China.   Osteoporosis: Patient complains of osteoporosis. She was diagnosed with osteoporosis by bone density scan in 06/02/2014 denies history of fracture.The cause of osteoporosis is felt to be due to postmenopausal estrogen deficiency and dietary calcium and/or vitamin D deficiency. She is currently being treated with calcium and vitamin D supplementation. She is currently being treated with bisphosphonates. Reports good tolerance of medication and improved LE strength.   Skin Rash Located bilateral breast folds. Tendency for skin itching, redness, breaks in skin if not using nystatin powder. Improving with nystatin powder use. Needs refill.  Dr. Floyce Stakes for thyroid goiter, Dr. Juleen China for CKD  Past Medical History  Diagnosis Date  . Hyperlipidemia   . Arthritis   . Hypertension   . Thyroid disease   . Osteoporosis   . Gout, chronic     Past Surgical History  Procedure Laterality Date  . Lung surgery Right 2004    biopsy normal    Family History  Problem Relation Age of Onset  . Bone cancer Father   . Thyroid disease  Sister     Social History   Social History  . Marital Status: Single    Spouse Name: N/A  . Number of Children: 2  . Years of Education: N/A   Occupational History  . retired    Social History Main Topics  . Smoking status: Never Smoker   . Smokeless tobacco: Not on file  . Alcohol Use: No  . Drug Use: No  . Sexual Activity: No   Other Topics Concern  . Not on file   Social History Narrative     Current outpatient prescriptions:  .  alendronate (FOSAMAX) 70 MG tablet, TAKE 1 TABLET BY MOUTH ONCE A WEEK, Disp: 12 tablet, Rfl: 3 .  allopurinol (ZYLOPRIM) 100 MG tablet, Take 1 tablet (100 mg total) by mouth daily., Disp: 90 tablet, Rfl: 3 .  amLODipine (NORVASC) 10 MG tablet, Take 1 tablet (10 mg total) by mouth daily., Disp: 90 tablet, Rfl: 1 .  aspirin 81 MG tablet, Take 81 mg by mouth., Disp: , Rfl:  .  atenolol (TENORMIN) 50 MG tablet, Take 1 tablet (50 mg total) by mouth daily., Disp: 90 tablet, Rfl: 1 .  Calcium Carbonate-Vitamin D 600-400 MG-UNIT per tablet, Take by mouth., Disp: , Rfl:  .  Cholecalciferol (VITAMIN D3) 50000 UNITS CAPS, Take by mouth., Disp: , Rfl:  .  docusate sodium (COLACE) 100 MG capsule, Take by mouth., Disp: , Rfl:  .  Multiple Vitamin (MULTI-VITAMINS) TABS, Take by mouth., Disp: , Rfl:  .  Nystatin (NYAMYC) 100000 UNIT/GM POWD, Use sufficient amount TID on affected areas of skin, Disp: 60 g, Rfl: 2 .  senna (SENOKOT) 8.6 MG tablet, Take 1 tablet (8.6 mg total) by mouth 2 (two) times daily., Disp: 180 tablet, Rfl: 3  No Known Allergies  Fall Risk: Fall Risk  05/18/2015  Falls in the past year? No  Number falls in past yr: -  Injury with Fall? -  Risk for fall due to : -    Depression screen [facility] 2/9 05/18/2015 09/06/2014  Decreased Interest 0 0  Down, Depressed, Hopeless 0 0  PHQ - 2 Score 0 0     ROS  CONSTITUTIONAL: No significant weight changes, fever, chills, weakness or fatigue.  HEENT:  - Eyes: No visual changes.  - Ears: No  auditory changes. No pain.  - Nose: No sneezing, congestion, runny nose. - Throat: No sore throat. No changes in swallowing. SKIN: Stable skin rash.  CARDIOVASCULAR: No chest pain, chest pressure or chest discomfort. No palpitations or edema.  RESPIRATORY: No shortness of breath, cough or sputum.  GASTROINTESTINAL: No anorexia, nausea, vomiting. No changes in bowel habits. No abdominal pain or blood.  GENITOURINARY: No dysuria. No frequency. No discharge.  NEUROLOGICAL: No headache, dizziness, syncope, paralysis, ataxia, numbness or tingling in the extremities. No memory changes. No change in bowel or bladder control.  MUSCULOSKELETAL: Yes joint pain. No muscle pain. HEMATOLOGIC: No anemia, bleeding or bruising.  LYMPHATICS: No enlarged lymph nodes.  PSYCHIATRIC: No change in mood. No change in sleep pattern.  ENDOCRINOLOGIC: No reports of sweating, cold or heat intolerance. No polyuria or polydipsia.   Objective  Filed Vitals:   05/18/15 1355  BP: 106/58  Pulse: 98  Temp: 98 F (36.7 C)  TempSrc: Oral  Resp: 14  Weight: 263 lb (119.296 kg)  SpO2: 95%   Body mass index is 46.6 kg/(m^2).  Physical Exam  Constitutional: Patient is obese and well-nourished. In no distress. [place]ana with use of her walker per her baseline. HEENT:  - Head: Normocephalic and atraumatic.  - Ears: Bilateral TMs gray, no erythema or effusion - Nose: Nasal mucosa moist - Mouth/Throat: Oropharynx is clear and moist. No tonsillar hypertrophy or erythema. No post nasal drainage.  - Eyes: Conjunctivae clear, EOM movements normal. PERRLA. No scleral icterus.  Neck: Normal range of motion. Neck supple. No JVD present. Right sided thyromegaly present and stable. Cardiovascular: Normal rate, regular rhythm and normal heart sounds. No murmur heard.  Pulmonary/Chest: Effort normal and breath sounds normal. No respiratory distress. Breast Exam: Bilateral breasts no masses, skin dimpling or nipple changes.  Under the breast folds skin is hyperpigmented from chronic irritation, left side slight erythema however both sides no skin break down. Musculoskeletal: Normal range of motion bilateral UE and LE, no joint effusions. Peripheral vascular: Bilateral LE no edema. Neurological: Bilateral LE weakness stable 4/5 strength bilaterally. She is oriented to person, place, and time.  Skin: Skin is warm and dry. Rash under folds of skin improved. Psychiatric: Patient has a normal mood and affect. Behavior is normal in office today. Judgment and thought content normal in office today.  Assessment & Plan  1. Medicare annual wellness visit, subsequent Functional ability/safety issues: No Issues Hearing issues: Addressed  Activities of daily living: Discussed Home safety issues: No Issues  End Of Life Planning: Offered verbal information regarding advanced directives, healthcare power of attorney.  Preventative care, Health maintenance, Preventative health measures discussed.  Preventative screenings discussed today: lab work, colonoscopy, PAP, mammogram, DEXA.  Low Dose CT Chest recommended if Age 42-80 years, 30 pack-year currently smoking OR have quit w/in 15years.  Lifestyle risk factor issued reviewed: Diet, exercise, weight management, advised patient smoking is not healthy, nutrition/diet.  Preventative health measures discussed (5-10 year plan).  Reviewed and recommended vaccinations: - Pneumovax  - Prevnar  - Annual Influenza - Zostavax - Tdap   Depression screening: Done Fall risk screening: Done Discuss ADLs/IADLs: Done  Current medical providers: See HPI  Other health risk factors identified this visit: No other issues Cognitive impairment issues: None identified  All above discussed with patient. Appropriate education, counseling and referral will be made based upon the above.    2. 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    3. Weakness of both lower extremities Improved slightly per patient with bisphos use.  4. Right-sided uninodular goiter Followed with serial [place] by Dr. Floyce Stakes. I will get thyroid panel.  - TSH - T3, free - T4, free  5. Hypothyroidism due to acquired atrophy of thyroid Due for thyroid panel.  - Lipid panel - TSH - T3, free - T4, free  6. Need for pneumococcal vaccination  - Pneumococcal polysaccharide vaccine 23-valent greater than or equal to 2yo subcutaneous/IM  7. Skin yeast infection Stable.  - Nystatin ([place]) 100000 UNIT/GM POWD; Use sufficient amount TID on affected areas of skin  Dispense: 60 g; Refill: 2  8. Hypertension goal BP (blood pressure) < 140/90 Well controled.  - atenolol (TENORMIN) 50 MG tablet; Take 1 tablet (50 mg total) by mouth daily.  Dispense: 90 tablet; Refill: 1 - amLODipine (NORVASC) 10 MG tablet; Take 1 tablet (10 mg total) by mouth daily.  Dispense: 90 tablet; Refill: 1 - CBC with Differential/Platelet - Comprehensive metabolic panel - Lipid panel  9. Hyperglycemia Assess glycemic status due to obesity and hyperglycemia seen on previous glucose checks.  - Hemoglobin A1c

## 2015-05-20 LAB — CBC WITH DIFFERENTIAL/PLATELET
Basophils Absolute: 0 10*3/uL (ref 0.0–0.2)
Basos: 0 %
EOS (ABSOLUTE): 0.2 10*3/uL (ref 0.0–0.4)
Eos: 2 %
Hematocrit: 42.7 % (ref 34.0–46.6)
Hemoglobin: 13.7 g/dL (ref 11.1–15.9)
Immature Grans (Abs): 0 10*3/uL (ref 0.0–0.1)
Immature Granulocytes: 0 %
Lymphocytes Absolute: 3.9 10*3/uL — ABNORMAL HIGH (ref 0.7–3.1)
Lymphs: 42 %
MCH: 26.6 pg (ref 26.6–33.0)
MCHC: 32.1 g/dL (ref 31.5–35.7)
MCV: 83 fL (ref 79–97)
Monocytes Absolute: 0.6 10*3/uL (ref 0.1–0.9)
Monocytes: 6 %
Neutrophils Absolute: 4.6 10*3/uL (ref 1.4–7.0)
Neutrophils: 50 %
Platelets: 233 10*3/uL (ref 150–379)
RBC: 5.15 x10E6/uL (ref 3.77–5.28)
RDW: 14.4 % (ref 12.3–15.4)
WBC: 9.2 10*3/uL (ref 3.4–10.8)

## 2015-05-20 LAB — COMPREHENSIVE METABOLIC PANEL
ALT: 28 IU/L (ref 0–32)
AST: 33 IU/L (ref 0–40)
Albumin/Globulin Ratio: 1.3 (ref 1.1–2.5)
Albumin: 4.3 g/dL (ref 3.5–4.8)
Alkaline Phosphatase: 51 IU/L (ref 39–117)
BUN/Creatinine Ratio: 17 (ref 11–26)
BUN: 15 mg/dL (ref 8–27)
Bilirubin Total: 0.7 mg/dL (ref 0.0–1.2)
CO2: 19 mmol/L (ref 18–29)
Calcium: 9.4 mg/dL (ref 8.7–10.3)
Chloride: 101 mmol/L (ref 96–106)
Creatinine, Ser: 0.88 mg/dL (ref 0.57–1.00)
GFR calc Af Amer: 76 mL/min/{1.73_m2} (ref >59)
GFR calc non Af Amer: 66 mL/min/{1.73_m2} (ref >59)
Globulin, Total: 3.3 g/dL (ref 1.5–4.5)
Glucose: 147 mg/dL — ABNORMAL HIGH (ref 65–99)
Potassium: 4.2 mmol/L (ref 3.5–5.2)
Sodium: 141 mmol/L (ref 134–144)
Total Protein: 7.6 g/dL (ref 6.0–8.5)

## 2015-05-20 LAB — LIPID PANEL
Chol/HDL Ratio: 3.5 ratio units (ref 0.0–4.4)
Cholesterol, Total: 156 mg/dL (ref 100–199)
HDL: 44 mg/dL (ref >39)
LDL Calculated: 83 mg/dL (ref 0–99)
Triglycerides: 147 mg/dL (ref 0–149)
VLDL Cholesterol Cal: 29 mg/dL (ref 5–40)

## 2015-05-20 LAB — HEMOGLOBIN A1C
Est. average glucose Bld gHb Est-mCnc: 128 mg/dL
Hgb A1c MFr Bld: 6.1 % — ABNORMAL HIGH (ref 4.8–5.6)

## 2015-05-20 LAB — TSH: TSH: 4.55 u[IU]/mL — ABNORMAL HIGH (ref 0.450–4.500)

## 2015-05-20 LAB — T4, FREE: Free T4: 1.18 ng/dL (ref 0.82–1.77)

## 2015-05-20 LAB — T3, FREE: T3, Free: 3.2 pg/mL (ref 2.0–4.4)

## 2015-05-22 ENCOUNTER — Encounter: Payer: Self-pay | Admitting: Family Medicine

## 2015-05-22 DIAGNOSIS — E038 Other specified hypothyroidism: Secondary | ICD-10-CM | POA: Insufficient documentation

## 2015-05-22 DIAGNOSIS — E039 Hypothyroidism, unspecified: Secondary | ICD-10-CM | POA: Insufficient documentation

## 2015-05-23 ENCOUNTER — Other Ambulatory Visit: Payer: Self-pay | Admitting: Family Medicine

## 2015-05-23 DIAGNOSIS — Z1231 Encounter for screening mammogram for malignant neoplasm of breast: Secondary | ICD-10-CM

## 2015-05-25 ENCOUNTER — Telehealth: Payer: Self-pay | Admitting: Family Medicine

## 2015-05-25 NOTE — Telephone Encounter (Signed)
Patient notified: fruits vegtables, baked or grilled chicken, fish and Avoid fried fatty foods, and carbs.  With daily exercise such as walking.

## 2015-05-25 NOTE — Telephone Encounter (Signed)
PT IS ASKING FOR A CALL BACK ABOUT WHAT SHE IS TO BE ABLE TO EAT OR NOT EAT. SAID THAT YOU ALL LEFT HER A MESSAGE AND SHE IS CALLING BACK

## 2015-06-03 ENCOUNTER — Other Ambulatory Visit: Payer: Self-pay | Admitting: Family Medicine

## 2015-06-03 ENCOUNTER — Ambulatory Visit
Admission: RE | Admit: 2015-06-03 | Discharge: 2015-06-03 | Disposition: A | Discharge status: Home / Self Care | Payer: Medicare Other | Source: Ambulatory Visit | Attending: Family Medicine | Admitting: Family Medicine

## 2015-06-03 DIAGNOSIS — Z1231 Encounter for screening mammogram for malignant neoplasm of breast: Secondary | ICD-10-CM | POA: Diagnosis not present

## 2015-11-14 ENCOUNTER — Ambulatory Visit (INDEPENDENT_AMBULATORY_CARE_PROVIDER_SITE_OTHER): Payer: Medicare Other | Admitting: Family Medicine

## 2015-11-14 ENCOUNTER — Encounter: Payer: Self-pay | Admitting: Family Medicine

## 2015-11-14 VITALS — BP 118/82 | HR 81 | Temp 98.8°F | Resp 16 | Wt 266.1 lb

## 2015-11-14 DIAGNOSIS — M1009 Idiopathic gout, multiple sites: Secondary | ICD-10-CM

## 2015-11-14 DIAGNOSIS — M810 Age-related osteoporosis without current pathological fracture: Secondary | ICD-10-CM | POA: Diagnosis not present

## 2015-11-14 DIAGNOSIS — R8290 Unspecified abnormal findings in urine: Secondary | ICD-10-CM | POA: Diagnosis not present

## 2015-11-14 DIAGNOSIS — I10 Essential (primary) hypertension: Secondary | ICD-10-CM

## 2015-11-14 DIAGNOSIS — M1A00X0 Idiopathic chronic gout, unspecified site, without tophus (tophi): Secondary | ICD-10-CM

## 2015-11-14 DIAGNOSIS — E038 Other specified hypothyroidism: Secondary | ICD-10-CM

## 2015-11-14 DIAGNOSIS — E041 Nontoxic single thyroid nodule: Secondary | ICD-10-CM

## 2015-11-14 DIAGNOSIS — M1000 Idiopathic gout, unspecified site: Secondary | ICD-10-CM | POA: Diagnosis not present

## 2015-11-14 DIAGNOSIS — E785 Hyperlipidemia, unspecified: Secondary | ICD-10-CM | POA: Insufficient documentation

## 2015-11-14 DIAGNOSIS — E039 Hypothyroidism, unspecified: Secondary | ICD-10-CM

## 2015-11-14 DIAGNOSIS — Z5181 Encounter for therapeutic drug level monitoring: Secondary | ICD-10-CM | POA: Diagnosis not present

## 2015-11-14 DIAGNOSIS — R7303 Prediabetes: Secondary | ICD-10-CM | POA: Diagnosis not present

## 2015-11-14 LAB — COMPREHENSIVE METABOLIC PANEL
ALT: 30 U/L — ABNORMAL HIGH (ref 6–29)
AST: 30 U/L (ref 10–35)
Albumin: 4.1 g/dL (ref 3.6–5.1)
Alkaline Phosphatase: 40 U/L (ref 33–130)
BUN: 15 mg/dL (ref 7–25)
CO2: 24 mmol/L (ref 20–31)
Calcium: 9.3 mg/dL (ref 8.6–10.4)
Chloride: 105 mmol/L (ref 98–110)
Creat: 0.93 mg/dL (ref 0.60–0.93)
Glucose, Bld: 164 mg/dL — ABNORMAL HIGH (ref 65–99)
Potassium: 4.3 mmol/L (ref 3.5–5.3)
Sodium: 141 mmol/L (ref 135–146)
Total Bilirubin: 0.8 mg/dL (ref 0.2–1.2)
Total Protein: 7.3 g/dL (ref 6.1–8.1)

## 2015-11-14 LAB — CBC WITH DIFFERENTIAL/PLATELET
Basophils Absolute: 0 cells/uL (ref 0–200)
Basophils Relative: 0 %
Eosinophils Absolute: 196 cells/uL (ref 15–500)
Eosinophils Relative: 2 %
HCT: 42.7 % (ref 35.0–45.0)
Hemoglobin: 13.5 g/dL (ref 11.7–15.5)
Lymphocytes Relative: 24 %
Lymphs Abs: 2352 cells/uL (ref 850–3900)
MCH: 26.9 pg — ABNORMAL LOW (ref 27.0–33.0)
MCHC: 31.6 g/dL — ABNORMAL LOW (ref 32.0–36.0)
MCV: 85.1 fL (ref 80.0–100.0)
MPV: 10.5 fL (ref 7.5–12.5)
Monocytes Absolute: 882 cells/uL (ref 200–950)
Monocytes Relative: 9 %
Neutro Abs: 6370 cells/uL (ref 1500–7800)
Neutrophils Relative %: 65 %
Platelets: 191 10*3/uL (ref 140–400)
RBC: 5.02 MIL/uL (ref 3.80–5.10)
RDW: 14.5 % (ref 11.0–15.0)
WBC: 9.8 10*3/uL (ref 3.8–10.8)

## 2015-11-14 LAB — LIPID PANEL
Cholesterol: 156 mg/dL (ref 125–200)
HDL: 45 mg/dL — ABNORMAL LOW (ref >=46)
LDL Cholesterol: 87 mg/dL (ref <130)
Total CHOL/HDL Ratio: 3.5 Ratio (ref <=5.0)
Triglycerides: 121 mg/dL (ref <150)
VLDL: 24 mg/dL (ref <30)

## 2015-11-14 LAB — URIC ACID: Uric Acid, Serum: 6.9 mg/dL (ref 2.5–7.0)

## 2015-11-14 MED ORDER — ALENDRONATE SODIUM 70 MG PO TABS
70.0000 mg | ORAL_TABLET | ORAL | 3 refills | Status: DC
Start: 2015-11-14 — End: 2016-11-18

## 2015-11-14 MED ORDER — ALLOPURINOL 100 MG PO TABS: 100.0000 mg | ORAL_TABLET | Freq: Every day | ORAL | 3 refills | Status: DC

## 2015-11-14 MED ORDER — AMLODIPINE BESYLATE 10 MG PO TABS: 10.0000 mg | ORAL_TABLET | Freq: Every day | ORAL | 3 refills | Status: DC

## 2015-11-14 MED ORDER — SENNOSIDES 8.6 MG PO TABS: 1.0000 | ORAL_TABLET | Freq: Two times a day (BID) | ORAL | 3 refills | Status: DC

## 2015-11-14 MED ORDER — ATENOLOL 50 MG PO TABS: 50.0000 mg | ORAL_TABLET | Freq: Every day | ORAL | 3 refills | Status: DC

## 2015-11-14 NOTE — Assessment & Plan Note (Signed)
Check uric acid; avoid triggers

## 2015-11-14 NOTE — Assessment & Plan Note (Signed)
Next DEXA in March 2018; three servings of calcium a day

## 2015-11-14 NOTE — Assessment & Plan Note (Signed)
Check urine.

## 2015-11-14 NOTE — Patient Instructions (Addendum)
Your goal blood pressure is less than 150 mmHg on top. Try to follow the DASH guidelines (DASH stands for Dietary Approaches to Stop Hypertension) Try to limit the sodium in your diet.  Ideally, consume less than 1.5 grams (less than 1,500mg ) per day. Do not add salt when cooking or at the table.  Check the sodium amount on labels when shopping, and choose items lower in sodium when given a choice. Avoid or limit foods that already contain a lot of sodium. Eat a diet rich in fruits and vegetables and whole grains.  Check out the information at familydoctor.org entitled "Nutrition for Weight Loss: What You Need to Know about Fad Diets" Try to lose between 1-2 pounds per week by taking in fewer calories and burning off more calories You can succeed by limiting portions, limiting foods dense in calories and fat, becoming more active, and drinking 8 glasses of water a day (64 ounces) Don't skip meals, especially breakfast, as skipping meals may alter your metabolism Do not use over-the-counter weight loss pills or gimmicks that claim rapid weight loss A healthy BMI (or body mass index) is between 18.5 and 24.9 You can calculate your ideal BMI at the [employer] website [employer]fr

## 2015-11-14 NOTE — Assessment & Plan Note (Signed)
Check sgpt, creatinine, electrolytes

## 2015-11-14 NOTE — Assessment & Plan Note (Signed)
Managed by [facility] endocrinologist

## 2015-11-14 NOTE — Assessment & Plan Note (Signed)
Managed by [facility] endo

## 2015-11-14 NOTE — Progress Notes (Signed)
BP 118/82   Pulse 81   Temp 98.8 F (37.1 C) (Oral)   Resp 16   Wt 266 lb 1.6 oz (120.7 kg)   SpO2 96%   BMI 47.14 kg/m    Subjective:    Patient ID: Tracy Bolton, female    DOB: [birth year], 76 y.o.   MRN: [id number]  HPI: Tracy Bolton is a 75 y.o. female  Chief Complaint  Patient presents with  . Follow-up    6 months   She is here for f/u HTN; she checks her BP away from the doctor; goes up and down; not high, might be 118/76, fluctuates; tries to stay away from the salt; avoids processed meats  Gout; stays away from red meat and shrimp;   She had sepsis in 2015; was in rehab for a month;   Trying to lose weight; she has her thyroid checked by Dr. Tera Mater at [facility] for goiter; they checked labs in April  High cholesterol; came fasting today; does not care for pork; avoiding fried foods; not many eggs  Osteoporosis; last DEXA March 2016  Wants to be checked for kidney or urinary tract infection; no fevers; not all the time, just at times, just a sensation the last month or so; not quite burning, no itching; no urinary odor; no frequency  Depression screen [facility] 2/9 11/14/2015 05/18/2015 09/06/2014  Decreased Interest 0 0 0  Down, Depressed, Hopeless 0 0 0  PHQ - 2 Score 0 0 0   Relevant past medical, surgical, family and social history reviewed Past Medical History:  Diagnosis Date  . Arthritis   . Gout, chronic   . Hyperlipidemia   . Hypertension   . Osteoporosis   . Thyroid disease    Past Surgical History:  Procedure Laterality Date  . LUNG SURGERY Right 2004   biopsy normal   Family History  Problem Relation Age of Onset  . Bone cancer Father   . Thyroid disease Sister    Social History  Substance Use Topics  . Smoking status: Never Smoker  . Smokeless tobacco: Not on file  . Alcohol use No    Interim medical history since last visit reviewed. Allergies and medications reviewed  Review of Systems Per HPI unless specifically indicated  above     Objective:    BP 118/82   Pulse 81   Temp 98.8 F (37.1 C) (Oral)   Resp 16   Wt 266 lb 1.6 oz (120.7 kg)   SpO2 96%   BMI 47.14 kg/m   Wt Readings from Last 3 Encounters:  11/14/15 266 lb 1.6 oz (120.7 kg)  05/18/15 263 lb (119.3 kg)  09/06/14 244 lb 6.4 oz (110.9 kg)    Physical Exam  Constitutional: She appears well-developed and well-nourished. No distress.  obese  HENT:  Head: Normocephalic and atraumatic.  Eyes: EOM are normal. No scleral icterus.  Neck: Thyromegaly (right side) present.  Cardiovascular: Normal rate, regular rhythm and normal heart sounds.   No murmur heard. Pulmonary/Chest: Effort normal and breath sounds normal. No respiratory distress. She has no wheezes.  Abdominal: Soft. Bowel sounds are normal. She exhibits no distension.  Musculoskeletal: Normal range of motion. She exhibits no edema.       Left ankle: She exhibits swelling (nonpitting edema of left lateral malleolus).  Neurological: She is alert. She exhibits normal muscle tone.  Skin: Skin is warm and dry. She is not diaphoretic. No pallor.  Psychiatric: She has a normal mood  and affect. Her behavior is normal. Judgment and thought content normal. Her mood appears not anxious. She does not exhibit a depressed mood.   Results for orders placed or performed in visit on 05/18/15  CBC with Differential/Platelet  Result Value Ref Range   WBC 9.2 3.4 - 10.8 x10E3/uL   RBC 5.15 3.77 - 5.28 x10E6/uL   Hemoglobin 13.7 11.1 - 15.9 g/dL   Hematocrit 42.7 34.0 - 46.6 %   MCV 83 79 - 97 fL   MCH 26.6 26.6 - 33.0 pg   MCHC 32.1 31.5 - 35.7 g/dL   RDW 14.4 12.3 - 15.4 %   Platelets 233 150 - 379 x10E3/uL   Neutrophils 50 %   Lymphs 42 %   Monocytes 6 %   Eos 2 %   Basos 0 %   Neutrophils Absolute 4.6 1.4 - 7.0 x10E3/uL   Lymphocytes Absolute 3.9 (H) 0.7 - 3.1 x10E3/uL   Monocytes Absolute 0.6 0.1 - 0.9 x10E3/uL   EOS (ABSOLUTE) 0.2 0.0 - 0.4 x10E3/uL   Basophils Absolute 0.0 0.0 -  0.2 x10E3/uL   Immature Granulocytes 0 %   Immature Grans (Abs) 0.0 0.0 - 0.1 x10E3/uL  Comprehensive metabolic panel  Result Value Ref Range   Glucose 147 (H) 65 - 99 mg/dL   BUN 15 8 - 27 mg/dL   Creatinine, Ser 0.88 0.57 - 1.00 mg/dL   GFR calc non Af Amer 66 >59 mL/min/1.73   GFR calc Af Amer 76 >59 mL/min/1.73   BUN/Creatinine Ratio 17 11 - 26   Sodium 141 134 - 144 mmol/L   Potassium 4.2 3.5 - 5.2 mmol/L   Chloride 101 96 - 106 mmol/L   CO2 19 18 - 29 mmol/L   Calcium 9.4 8.7 - 10.3 mg/dL   Total Protein 7.6 6.0 - 8.5 g/dL   Albumin 4.3 3.5 - 4.8 g/dL   Globulin, Total 3.3 1.5 - 4.5 g/dL   Albumin/Globulin Ratio 1.3 1.1 - 2.5   Bilirubin Total 0.7 0.0 - 1.2 mg/dL   Alkaline Phosphatase 51 39 - 117 IU/L   AST 33 0 - 40 IU/L   ALT 28 0 - 32 IU/L  Lipid panel  Result Value Ref Range   Cholesterol, Total 156 100 - 199 mg/dL   Triglycerides 147 0 - 149 mg/dL   HDL 44 >39 mg/dL   VLDL Cholesterol Cal 29 5 - 40 mg/dL   LDL Calculated 83 0 - 99 mg/dL   Chol/HDL Ratio 3.5 0.0 - 4.4 ratio units  TSH  Result Value Ref Range   TSH 4.550 (H) 0.450 - 4.500 uIU/mL  T3, free  Result Value Ref Range   T3, Free 3.2 2.0 - 4.4 pg/mL  T4, free  Result Value Ref Range   Free T4 1.18 0.82 - 1.77 ng/dL  Hemoglobin A1c  Result Value Ref Range   Hgb A1c MFr Bld 6.1 (H) 4.8 - 5.6 %   Est. average glucose Bld gHb Est-mCnc 128 mg/dL      Assessment & Plan:   Problem List Items Addressed This Visit      Cardiovascular and Mediastinum   Hypertension goal BP (blood pressure) < 140/90 - Primary    DASH guidelines; well-controlled      Relevant Medications   atenolol (TENORMIN) 50 MG tablet   amLODipine (NORVASC) 10 MG tablet     Endocrine   Subclinical hypothyroidism    Managed by [facility] endocrinologist      Relevant Medications  atenolol (TENORMIN) 50 MG tablet   Right-sided uninodular goiter    Managed by [facility] endo      Relevant Medications   atenolol (TENORMIN) 50 MG  tablet     Musculoskeletal and Integument   OP (osteoporosis)    Next DEXA in March 2018; three servings of calcium a day      Relevant Medications   alendronate (FOSAMAX) 70 MG tablet   Chronic gouty arthritis    Check uric acid; avoid triggers      Relevant Medications   allopurinol (ZYLOPRIM) 100 MG tablet     Other   Urine abnormality    Check urine      Relevant Orders   Urinalysis w microscopic + reflex cultur   Pre-diabetes    Check A1c; encourgement given for weight loss; healthy eating encouraged      Relevant Orders   Hemoglobin A1c   Lipid panel   Obesity, Class III, BMI 40-49.9 (morbid obesity) (HCC)    See AVS      Hyperlipidemia    Check lipids      Relevant Medications   atenolol (TENORMIN) 50 MG tablet   amLODipine (NORVASC) 10 MG tablet   Other Relevant Orders   Lipid panel   Encounter for medication monitoring    Check sgpt, creatinine, electrolytes      Relevant Orders   Comprehensive metabolic panel   CBC with Differential/Platelet    Other Visit Diagnoses    Idiopathic gout, unspecified chronicity, unspecified site       Relevant Medications   allopurinol (ZYLOPRIM) 100 MG tablet   Other Relevant Orders   Uric acid     patient declined flu shot; will wait  Follow up plan: Return in about 6 months (around 05/16/2016) for fasting labs and follow-up.  An after-visit summary was printed and given to the patient at [place].  Please see the patient instructions which may contain other information and recommendations beyond what is mentioned above in the assessment and plan.  Meds ordered this encounter  Medications  . atenolol (TENORMIN) 50 MG tablet    Sig: Take 1 tablet (50 mg total) by mouth daily.    Dispense:  90 tablet    Refill:  3    Leave on hold until she calls for it  . amLODipine (NORVASC) 10 MG tablet    Sig: Take 1 tablet (10 mg total) by mouth daily.    Dispense:  90 tablet    Refill:  3    Leave on hold until  she calls for it  . allopurinol (ZYLOPRIM) 100 MG tablet    Sig: Take 1 tablet (100 mg total) by mouth daily.    Dispense:  90 tablet    Refill:  3    Leave on hold until she calls for it  . alendronate (FOSAMAX) 70 MG tablet    Sig: Take 1 tablet (70 mg total) by mouth once a week. Take with a full glass of water on an empty stomach.    Dispense:  12 tablet    Refill:  3    Leave on hold until she calls for it  . senna (SENOKOT) 8.6 MG tablet    Sig: Take 1 tablet (8.6 mg total) by mouth 2 (two) times daily.    Dispense:  180 tablet    Refill:  3    Leave on hold until she calls for it    Orders Placed This Encounter  Procedures  .  Urinalysis w microscopic + reflex cultur  . Hemoglobin A1c  . Comprehensive metabolic panel  . Lipid panel  . CBC with Differential/Platelet  . Uric acid

## 2015-11-14 NOTE — Assessment & Plan Note (Signed)
Check A1c; encourgement given for weight loss; healthy eating encouraged

## 2015-11-14 NOTE — Assessment & Plan Note (Signed)
DASH guidelines; well-controlled

## 2015-11-14 NOTE — Assessment & Plan Note (Signed)
See AVS

## 2015-11-14 NOTE — Assessment & Plan Note (Signed)
Check lipids 

## 2015-11-15 LAB — URINALYSIS W MICROSCOPIC + REFLEX CULTURE
Bacteria, UA: NONE SEEN [HPF]
Bilirubin Urine: NEGATIVE
Casts: NONE SEEN [LPF]
Crystals: NONE SEEN [HPF]
Glucose, UA: NEGATIVE
Hgb urine dipstick: NEGATIVE
Ketones, ur: NEGATIVE
Leukocytes, UA: NEGATIVE
Nitrite: NEGATIVE
Protein, ur: NEGATIVE
Specific Gravity, Urine: 1.021 (ref 1.001–1.035)
Yeast: NONE SEEN [HPF]
pH: 5.5 (ref 5.0–8.0)

## 2015-11-15 LAB — HEMOGLOBIN A1C
Hgb A1c MFr Bld: 6 % — ABNORMAL HIGH (ref <5.7)
Mean Plasma Glucose: 126 mg/dL

## 2015-11-17 ENCOUNTER — Other Ambulatory Visit: Payer: Self-pay | Admitting: Family Medicine

## 2015-11-17 MED ORDER — METFORMIN HCL ER 500 MG PO TB24: 500.0000 mg | ORAL_TABLET | Freq: Every day | ORAL | 5 refills | Status: DC

## 2015-11-17 NOTE — Progress Notes (Signed)
Metformin started for impaired fasting glucose

## 2015-11-29 ENCOUNTER — Telehealth: Payer: Self-pay

## 2015-11-29 ENCOUNTER — Encounter: Payer: Self-pay | Admitting: *Deleted

## 2015-11-29 ENCOUNTER — Ambulatory Visit
Admission: EM | Admit: 2015-11-29 | Discharge: 2015-11-29 | Disposition: A | Discharge status: Home / Self Care | Payer: Medicare Other | Attending: Family Medicine | Admitting: Family Medicine

## 2015-11-29 DIAGNOSIS — S46911A Strain of unspecified muscle, fascia and tendon at shoulder and upper arm level, right arm, initial encounter: Secondary | ICD-10-CM

## 2015-11-29 NOTE — Telephone Encounter (Signed)
That doesn't not sound like a reaction to metformin; that could be gallbladder disease or liver disease or right lower lobe pneumonia; I recommend she get checked out at urgent care today; thank you; please remove med from list, schedule f/u appt after urgent care

## 2015-11-29 NOTE — Telephone Encounter (Signed)
Pt.notified

## 2015-11-29 NOTE — Telephone Encounter (Signed)
Pt states she is having a reaction to new medication metformin.  She states is having right side back pain that radiates to shoulder down the arm to pointer finger.  She has currently stopped medication and seems to be getting better?  Please advise?

## 2015-11-29 NOTE — ED Triage Notes (Signed)
Patient started having right shoulder and back pain 3 days ago. The pain started radiating down her right arm to her fingers. Patient reports pain has began to resolve but it is still noticeable in her right bicep.

## 2015-12-26 ENCOUNTER — Other Ambulatory Visit: Payer: Self-pay

## 2015-12-26 MED ORDER — METFORMIN HCL ER 500 MG PO TB24: 500.0000 mg | ORAL_TABLET | Freq: Every day | ORAL | 1 refills | Status: DC

## 2015-12-26 NOTE — Telephone Encounter (Signed)
Last A1c and Cr reviewed; Rx approved 

## 2015-12-26 NOTE — Telephone Encounter (Signed)
Ins requires 90 day supply

## 2016-01-10 NOTE — ED Provider Notes (Addendum)
[facility]    CSN: [id number] Arrival date & time: 11/29/15  1530     History   Chief Complaint Chief Complaint  Patient presents with  . Shoulder Pain    HPI Tracy Bolton is a 75 y.o. female.   The history is provided by the patient.  Shoulder Pain  Location:  Shoulder Shoulder location:  R shoulder Injury: no   Dislocation: no   Foreign body present:  No foreign bodies Prior injury to area:  No (denies any falls, trauma) Relieved by:  Nothing Associated symptoms: no back pain, no decreased range of motion, no fever, no muscle weakness, no neck pain, no numbness, no stiffness, no swelling and no tingling   Risk factors: no concern for non-accidental trauma, no known bone disorder, no frequent fractures and no recent illness     Past Medical History:  Diagnosis Date  . Arthritis   . Gout, chronic   . Hyperlipidemia   . Hypertension   . Osteoporosis   . Thyroid disease     Patient Active Problem List   Diagnosis Date Noted  . Urine abnormality 11/14/2015  . Encounter for medication monitoring 11/14/2015  . Hyperlipidemia 11/14/2015  . Pre-diabetes 05/18/2015  . OP (osteoporosis) 09/06/2014  . Chronic gouty arthritis 09/06/2014  . Obesity, Class III, BMI 40-49.9 (morbid obesity) ([place]) 09/06/2014  . 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 Hypertension goal BP (blood pressure) < 140/90 07/12/2014  . 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 Weakness of both lower extremities 07/12/2014  . Dry skin 07/12/2014  . Right-sided uninodular goiter 07/12/2014  . Cyst of eye 07/12/2014  . Constipation, chronic 07/12/2014  . Skin yeast infection 07/12/2014  . Subclinical hypothyroidism 02/06/2013    Past Surgical History:  Procedure Laterality Date  . LUNG SURGERY Right 2004   biopsy normal    OB History    Gravida Para Term Preterm AB Living   2 2 2     2    SAB TAB Ectopic Multiple Live Births                Home Medications    Prior to Admission medications   Medication Sig Start Date End Date Taking? Authorizing Provider  alendronate (FOSAMAX) 70 MG tablet Take 1 tablet (70 mg total) by mouth once a week. Take with a full glass of water on an empty stomach. 11/14/15  Yes Arnetha Courser, MD  allopurinol (ZYLOPRIM) 100 MG tablet Take 1 tablet (100 mg total) by mouth daily. 11/14/15  Yes Arnetha Courser, MD  amLODipine (NORVASC) 10 MG tablet Take 1 tablet (10 mg total) by mouth daily. 11/14/15  Yes Arnetha Courser, MD  aspirin 81 MG tablet Take 81 mg by mouth. 03/13/06  Yes Historical Provider, MD  atenolol (TENORMIN) 50 MG tablet Take 1 tablet (50 mg total) by mouth daily. 11/14/15  Yes Arnetha Courser, MD  Calcium Carbonate-Vitamin D 600-400 MG-UNIT per tablet Take by mouth. 03/13/06  Yes Historical Provider, MD  Multiple Vitamin (MULTI-VITAMINS) TABS Take by mouth. 11/14/07  Yes Historical Provider, MD  senna (SENOKOT) 8.6 MG tablet Take 1 tablet (8.6 mg total) by mouth 2 (two) times daily. 11/14/15  Yes Arnetha Courser, MD  Cholecalciferol (VITAMIN D3) 50000 UNITS CAPS Take by mouth. 05/10/14   Historical Provider, MD  metFORMIN (GLUCOPHAGE XR) 500 MG 24 hr tablet Take 1 tablet (500 mg total) by mouth daily with breakfast. To help  control blood sugar 12/26/15   Arnetha Courser, MD  Nystatin [facility]) 100000 UNIT/GM POWD Use sufficient amount TID on affected areas of skin 05/18/15   Bobetta Lime, MD    Family History Family History  Problem Relation Age of Onset  . Bone cancer Father   . Thyroid disease Sister     Social History Social History  Substance Use Topics  . Smoking status: Never Smoker  . Smokeless tobacco: Never Used  . Alcohol use No     Allergies   Review of patient's allergies indicates no known allergies.   Review of Systems Review of Systems  Constitutional: Negative for fever.  Musculoskeletal: Negative for back pain, neck pain and stiffness.      Physical Exam Triage Vital Signs ED Triage Vitals  Enc Vitals Group     BP 11/29/15 1551 117/62     Pulse Rate 11/29/15 1551 76     Resp 11/29/15 1551 18     Temp 11/29/15 1551 98.4 F (36.9 C)     Temp Source 11/29/15 1551 Oral     SpO2 11/29/15 1551 96 %     Weight 11/29/15 1553 266 lb (120.7 kg)     Height 11/29/15 1553 5\' 2"  (1.575 m)     Head Circumference --      Peak Flow --      Pain Score 11/29/15 1557 0     Pain Loc --      Pain Edu? --      Excl. in [place]? --    No data found.   Updated Vital Signs BP 117/62 (BP Location: Right Wrist)   Pulse 76   Temp 98.4 F (36.9 C) (Oral)   Resp 18   Ht 5\' 2"  (1.575 m)   Wt 266 lb (120.7 kg)   SpO2 96%   BMI 48.65 kg/m   Visual Acuity Right Eye Distance:   Left Eye Distance:   Bilateral Distance:    Right Eye Near:   Left Eye Near:    Bilateral Near:     Physical Exam  Constitutional: She appears well-developed and well-nourished. No distress.  Musculoskeletal:       Right shoulder: She exhibits tenderness (over deltoid muscle and trapezius). She exhibits normal range of motion, no bony tenderness, no swelling, no effusion, no crepitus, no deformity, no laceration, no pain, no spasm, normal pulse and normal strength.  Skin: She is not diaphoretic.  Nursing note and vitals reviewed.    UC Treatments / Results  Labs (all labs ordered are listed, but only abnormal results are displayed) Labs Reviewed - No data to display  EKG  EKG Interpretation None       Radiology No results found.  Procedures Procedures (including critical care time)  Medications Ordered in UC Medications - No data to display   Initial Impression / Assessment and Plan / UC Course  I have reviewed the triage vital signs and the nursing notes.  Pertinent labs & imaging results that were available during my care of the patient were reviewed by me and considered in my medical decision making (see chart for  details).  Clinical Course      Final Clinical Impressions(s) / UC Diagnoses   Final diagnoses:  Right shoulder strain, initial encounter    New Prescriptions Discharge Medication List as of 11/29/2015  4:18 PM     1. diagnosis reviewed with patient 2. Recommend supportive treatment with heat, otc analgesics, stretching/range of motion  3.  Follow-up prn if symptoms worsen or don't improve   Norval Gable, MD 01/10/16 [place], MD 01/10/16 1302

## 2016-05-17 ENCOUNTER — Ambulatory Visit: Payer: Medicare Other | Admitting: Family Medicine

## 2016-05-18 ENCOUNTER — Ambulatory Visit (INDEPENDENT_AMBULATORY_CARE_PROVIDER_SITE_OTHER): Payer: Medicare Other | Admitting: Family Medicine

## 2016-05-18 ENCOUNTER — Encounter: Payer: Self-pay | Admitting: Family Medicine

## 2016-05-18 DIAGNOSIS — E782 Mixed hyperlipidemia: Secondary | ICD-10-CM

## 2016-05-18 DIAGNOSIS — L853 Xerosis cutis: Secondary | ICD-10-CM

## 2016-05-18 DIAGNOSIS — E038 Other specified hypothyroidism: Secondary | ICD-10-CM

## 2016-05-18 DIAGNOSIS — R7303 Prediabetes: Secondary | ICD-10-CM

## 2016-05-18 DIAGNOSIS — L84 Corns and callosities: Secondary | ICD-10-CM | POA: Insufficient documentation

## 2016-05-18 DIAGNOSIS — E041 Nontoxic single thyroid nodule: Secondary | ICD-10-CM

## 2016-05-18 DIAGNOSIS — M1A00X0 Idiopathic chronic gout, unspecified site, without tophus (tophi): Secondary | ICD-10-CM | POA: Diagnosis not present

## 2016-05-18 DIAGNOSIS — Z5181 Encounter for therapeutic drug level monitoring: Secondary | ICD-10-CM | POA: Diagnosis not present

## 2016-05-18 DIAGNOSIS — E039 Hypothyroidism, unspecified: Secondary | ICD-10-CM

## 2016-05-18 DIAGNOSIS — I10 Essential (primary) hypertension: Secondary | ICD-10-CM | POA: Diagnosis not present

## 2016-05-18 LAB — COMPLETE METABOLIC PANEL WITH GFR
ALT: 34 U/L — ABNORMAL HIGH (ref 6–29)
AST: 36 U/L — ABNORMAL HIGH (ref 10–35)
Albumin: 4.1 g/dL (ref 3.6–5.1)
Alkaline Phosphatase: 37 U/L (ref 33–130)
BUN: 17 mg/dL (ref 7–25)
CO2: 25 mmol/L (ref 20–31)
Calcium: 9.8 mg/dL (ref 8.6–10.4)
Chloride: 104 mmol/L (ref 98–110)
Creat: 0.96 mg/dL — ABNORMAL HIGH (ref 0.60–0.93)
GFR, Est African American: 68 mL/min (ref >=60)
GFR, Est Non African American: 59 mL/min — ABNORMAL LOW (ref >=60)
Glucose, Bld: 156 mg/dL — ABNORMAL HIGH (ref 65–99)
Potassium: 4.8 mmol/L (ref 3.5–5.3)
Sodium: 140 mmol/L (ref 135–146)
Total Bilirubin: 0.9 mg/dL (ref 0.2–1.2)
Total Protein: 7.4 g/dL (ref 6.1–8.1)

## 2016-05-18 LAB — HEMOGLOBIN A1C
Hgb A1c MFr Bld: 5.5 % (ref <5.7)
Mean Plasma Glucose: 111 mg/dL

## 2016-05-18 LAB — LIPID PANEL
Cholesterol: 167 mg/dL (ref <200)
HDL: 42 mg/dL — ABNORMAL LOW (ref >50)
LDL Cholesterol: 91 mg/dL (ref <100)
Total CHOL/HDL Ratio: 4 Ratio (ref <5.0)
Triglycerides: 172 mg/dL — ABNORMAL HIGH (ref <150)
VLDL: 34 mg/dL — ABNORMAL HIGH (ref <30)

## 2016-05-18 NOTE — Assessment & Plan Note (Signed)
Pre-ulcerative callus on the LEFT foot; refer to podiatrist

## 2016-05-18 NOTE — Assessment & Plan Note (Signed)
Managed by [facility] endocrinologist

## 2016-05-18 NOTE — Assessment & Plan Note (Signed)
Check A1c and glucose; praise given for weight loss and encouragement to lose more

## 2016-05-18 NOTE — Assessment & Plan Note (Signed)
Managed by [facility] endo

## 2016-05-18 NOTE — Assessment & Plan Note (Signed)
Check uric acid; she knows to avoid purine-rich foods; avoid chicken or beef stock and gravies

## 2016-05-18 NOTE — Assessment & Plan Note (Signed)
Encouraged her to keep going with weight loss efforts; see AVS

## 2016-05-18 NOTE — Progress Notes (Signed)
BP 116/70   Pulse 88   Temp 97.8 F (36.6 C) (Oral)   Resp 16   Wt 260 lb (117.9 kg)   SpO2 95%   BMI 47.55 kg/m    Subjective:    Patient ID: Tracy Bolton, female    DOB: 08/11/1940, 76 y.o.   MRN: [id number]  HPI: Tracy Bolton is a 76 y.o. female  Chief Complaint  Patient presents with  . Diabetes  . Hyperlipidemia  . Hypertension   Patient is here for follow-up  Feet are really dry, cracks and dry skin  Obesity; hard to lose weight but has managed to lose six pounds; she watches her carbs; she eats mostly chicken and fish instead of hamburgers  She has gout; no arthritis that she knows of; elbows  She has hypertension; trying to lose weight; BP controlled today; checks at home and gets fluctuating in the good range; she was seeing  Dr. Mauri Reading at [facility], then saw Dr. Juleen [place] and now not seeing them any more; wants [place] to do that; urine does not have foam or bubbles  She has high cholesterol; tries to limit fatty meats  Sees [facility] endocrinologist for goiter; sees him in April; it is about the same; he'll check labs in April; no problems with swallowing  She has osteoporosis; jsut started bisphosphonate in 2016  Depression screen [facility] 2/9 05/18/2016 11/14/2015 05/18/2015 09/06/2014  Decreased Interest 0 0 0 0  Down, Depressed, Hopeless 0 0 0 0  PHQ - 2 Score 0 0 0 0   Relevant past medical, surgical, family and social history reviewed Past Medical History:  Diagnosis Date  . Arthritis   . Gout, chronic   . Hyperlipidemia   . Hypertension   . Osteoporosis   . Thyroid disease    Past Surgical History:  Procedure Laterality Date  . LUNG SURGERY Right 2004   biopsy normal   Family History  Problem Relation Age of Onset  . Bone cancer Father   . Thyroid disease Sister    Social History  Substance Use Topics  . Smoking status: Never Smoker  . Smokeless tobacco: Never Used  . Alcohol use No   Interim medical history since last visit reviewed. Allergies  and medications reviewed  Review of Systems Per HPI unless specifically indicated above     Objective:    BP 116/70   Pulse 88   Temp 97.8 F (36.6 C) (Oral)   Resp 16   Wt 260 lb (117.9 kg)   SpO2 95%   BMI 47.55 kg/m   Wt Readings from Last 3 Encounters:  05/18/16 260 lb (117.9 kg)  11/29/15 266 lb (120.7 kg)  11/14/15 266 lb 1.6 oz (120.7 kg)    Physical Exam  Constitutional: She appears well-developed and well-nourished. No distress.  Morbidly obese  HENT:  Head: Normocephalic and atraumatic.  Eyes: EOM are normal. No scleral icterus.  Neck: Thyromegaly (right side) present.  Cardiovascular: Normal rate, regular rhythm and normal heart sounds.   No murmur heard. Pulmonary/Chest: Effort normal and breath sounds normal. No respiratory distress. She has no wheezes.  Abdominal: Soft. Bowel sounds are normal. She exhibits no distension.  Musculoskeletal: Normal range of motion. She exhibits no edema.       Left ankle: She exhibits swelling (nonpitting edema of left lateral malleolus).  Neurological: She is alert. She exhibits normal muscle tone.  Skin: Skin is warm and dry. She is not diaphoretic. No pallor.  preulcerative callus  LEFT foot  Psychiatric: She has a normal mood and affect. Judgment and thought content normal. Her mood appears not anxious. She does not exhibit a depressed mood.  Pleasant, good eye contact with examiner       Assessment & Plan:   Problem List Items Addressed This Visit      Cardiovascular and Mediastinum   Hypertension goal BP (blood pressure) < 140/90    Will check Cr and urine microalbumin:Cr today      Relevant Orders   Microalbumin / creatinine urine ratio (Completed)     Endocrine   Subclinical hypothyroidism     Managed by [facility] endo      Right-sided uninodular goiter    Managed by [facility] endocrinologist        Musculoskeletal and Integument   Chronic gouty arthritis    Check uric acid; she knows to avoid purine-rich  foods; avoid chicken or beef stock and gravies      Relevant Orders   Uric acid (Completed)   Callus of foot    Pre-ulcerative callus on the LEFT foot; refer to podiatrist        Other   Pre-diabetes    Check A1c and glucose; praise given for weight loss and encouragement to lose more      Relevant Orders   Lipid panel (Completed)   Microalbumin / creatinine urine ratio (Completed)   Hemoglobin A1c (Completed)   Obesity, Class III, BMI 40-49.9 (morbid obesity) ([place])    Encouraged her to keep going with weight loss efforts; see AVS      Hyperlipidemia    Avoid fatty meats; check lipids today; fasting today      Encounter for medication monitoring    Monitor labs      Relevant Orders   COMPLETE METABOLIC PANEL WITH GFR (Completed)   Dry skin    OTC creams and not helpful; will Rx a steroid cream, mix moisturizer with steroid half and half          Follow up plan: Return in about 6 months (around 11/15/2016) for follow-up and fasting labs.  An after-visit summary was printed and given to the patient at [place].  Please see the patient instructions which may contain other information and recommendations beyond what is mentioned above in the assessment and plan.  No orders of the defined types were placed in this encounter.   Orders Placed This Encounter  Procedures  . Lipid panel  . Microalbumin / creatinine urine ratio  . Hemoglobin A1c  . COMPLETE METABOLIC PANEL WITH GFR  . Uric acid

## 2016-05-18 NOTE — Patient Instructions (Signed)
Check out the information at familydoctor.org entitled "Nutrition for Weight Loss: What You Need to Know about Fad Diets" Try to lose between 1-2 pounds per week by taking in fewer calories and burning off more calories You can succeed by limiting portions, limiting foods dense in calories and fat, becoming more active, and drinking 8 glasses of water a day (64 ounces) Don't skip meals, especially breakfast, as skipping meals may alter your metabolism Do not use over-the-counter weight loss pills or gimmicks that claim rapid weight loss A healthy BMI (or body mass index) is between 18.5 and 24.9 You can calculate your ideal BMI at the [place] website ClubMonetize.fr We'll get labs today Your goal blood pressure is less than 140 mmHg on top. Try to follow the DASH guidelines (DASH stands for Dietary Approaches to Stop Hypertension) Try to limit the sodium in your diet.  Ideally, consume less than 1.5 grams (less than 1,500mg ) per day. Do not add salt when cooking or at the table.  Check the sodium amount on labels when shopping, and choose items lower in sodium when given a choice. Avoid or limit foods that already contain a lot of sodium. Eat a diet rich in fruits and vegetables and whole grains. Try to limit saturated fats in your diet (bologna, hot dogs, barbeque, cheeseburgers, hamburgers, steak, bacon, sausage, cheese, etc.) and get more fresh fruits, vegetables, and whole grains

## 2016-05-18 NOTE — Assessment & Plan Note (Signed)
Avoid fatty meats; check lipids today; fasting today

## 2016-05-18 NOTE — Assessment & Plan Note (Signed)
Monitor labs 

## 2016-05-18 NOTE — Assessment & Plan Note (Signed)
Will check Cr and urine microalbumin:Cr today

## 2016-05-18 NOTE — Assessment & Plan Note (Signed)
OTC creams and not helpful; will Rx a steroid cream, mix moisturizer with steroid half and half

## 2016-05-19 ENCOUNTER — Other Ambulatory Visit: Payer: Self-pay | Admitting: Family Medicine

## 2016-05-19 DIAGNOSIS — R7401 Elevation of levels of liver transaminase levels: Secondary | ICD-10-CM

## 2016-05-19 DIAGNOSIS — R740 Nonspecific elevation of levels of transaminase and lactic acid dehydrogenase [LDH]: Principal | ICD-10-CM

## 2016-05-19 LAB — MICROALBUMIN / CREATININE URINE RATIO
Creatinine, Urine: 134 mg/dL (ref 20–320)
Microalb Creat Ratio: 10 mcg/mg creat (ref <30)
Microalb, Ur: 1.3 mg/dL

## 2016-05-19 LAB — URIC ACID: Uric Acid, Serum: 6.6 mg/dL (ref 2.5–7.0)

## 2016-05-19 NOTE — Progress Notes (Signed)
Recheck labs in 6 weeks.  

## 2016-05-19 NOTE — Assessment & Plan Note (Signed)
Check labs in 6 weeks after weight loss; if still up, further testing

## 2016-05-21 ENCOUNTER — Other Ambulatory Visit: Payer: Self-pay | Admitting: Family Medicine

## 2016-05-21 DIAGNOSIS — Z1231 Encounter for screening mammogram for malignant neoplasm of breast: Secondary | ICD-10-CM

## 2016-06-12 ENCOUNTER — Ambulatory Visit
Admission: RE | Admit: 2016-06-12 | Discharge: 2016-06-12 | Disposition: A | Discharge status: Home / Self Care | Payer: Medicare Other | Source: Ambulatory Visit | Attending: Family Medicine | Admitting: Family Medicine

## 2016-06-12 DIAGNOSIS — Z1231 Encounter for screening mammogram for malignant neoplasm of breast: Secondary | ICD-10-CM | POA: Diagnosis not present

## 2016-07-13 ENCOUNTER — Other Ambulatory Visit: Payer: Self-pay | Admitting: Family Medicine

## 2016-07-13 NOTE — Telephone Encounter (Signed)
Patient is overdue for repeat labs; please give gentle reminder Also, I received a refill request for metformin; she is not supposed to be taking that any more Thank you

## 2016-07-16 NOTE — Telephone Encounter (Signed)
Left detailed voicemail

## 2016-07-18 ENCOUNTER — Other Ambulatory Visit: Payer: Self-pay

## 2016-07-18 DIAGNOSIS — B372 Candidiasis of skin and nail: Secondary | ICD-10-CM

## 2016-07-18 MED ORDER — NYSTATIN 100000 UNIT/GM EX POWD: CUTANEOUS | 2 refills | Status: DC

## 2016-07-19 ENCOUNTER — Other Ambulatory Visit: Payer: Self-pay

## 2016-07-19 DIAGNOSIS — R7401 Elevation of levels of liver transaminase levels: Secondary | ICD-10-CM

## 2016-07-19 DIAGNOSIS — R740 Nonspecific elevation of levels of transaminase and lactic acid dehydrogenase [LDH]: Principal | ICD-10-CM

## 2016-07-19 LAB — COMPLETE METABOLIC PANEL WITH GFR
AG Ratio: 1.2 Ratio (ref 1.0–2.5)
ALT: 31 U/L — ABNORMAL HIGH (ref 6–29)
AST: 31 U/L (ref 10–35)
Albumin: 4 g/dL (ref 3.6–5.1)
Alkaline Phosphatase: 45 U/L (ref 33–130)
BUN/Creatinine Ratio: 18.7 Ratio (ref 6–22)
BUN: 17 mg/dL (ref 7–25)
CO2: 24 mmol/L (ref 20–31)
Calcium: 9.5 mg/dL (ref 8.6–10.4)
Chloride: 104 mmol/L (ref 98–110)
Creat: 0.91 mg/dL (ref 0.60–0.93)
GFR, Est African American: 72 mL/min (ref >=60)
GFR, Est Non African American: 63 mL/min (ref >=60)
Globulin: 3.3 g/dL (ref 1.9–3.7)
Glucose, Bld: 140 mg/dL — ABNORMAL HIGH (ref 65–99)
Potassium: 4.6 mmol/L (ref 3.5–5.3)
Sodium: 141 mmol/L (ref 135–146)
Total Bilirubin: 0.8 mg/dL (ref 0.2–1.2)
Total Protein: 7.3 g/dL (ref 6.1–8.1)

## 2016-11-15 ENCOUNTER — Ambulatory Visit: Payer: Medicare Other | Admitting: Family Medicine

## 2016-11-18 ENCOUNTER — Other Ambulatory Visit: Payer: Self-pay | Admitting: Family Medicine

## 2016-11-19 ENCOUNTER — Ambulatory Visit (INDEPENDENT_AMBULATORY_CARE_PROVIDER_SITE_OTHER): Payer: Medicare Other | Admitting: Family Medicine

## 2016-11-19 ENCOUNTER — Encounter: Payer: Self-pay | Admitting: Family Medicine

## 2016-11-19 VITALS — BP 124/62 | HR 85 | Temp 97.9°F | Resp 16 | Wt 260.0 lb

## 2016-11-19 DIAGNOSIS — I10 Essential (primary) hypertension: Secondary | ICD-10-CM

## 2016-11-19 DIAGNOSIS — M1A00X0 Idiopathic chronic gout, unspecified site, without tophus (tophi): Secondary | ICD-10-CM

## 2016-11-19 DIAGNOSIS — Z5181 Encounter for therapeutic drug level monitoring: Secondary | ICD-10-CM

## 2016-11-19 DIAGNOSIS — E041 Nontoxic single thyroid nodule: Secondary | ICD-10-CM

## 2016-11-19 DIAGNOSIS — R29898 Other symptoms and signs involving the musculoskeletal system: Secondary | ICD-10-CM

## 2016-11-19 DIAGNOSIS — E782 Mixed hyperlipidemia: Secondary | ICD-10-CM

## 2016-11-19 DIAGNOSIS — E038 Other specified hypothyroidism: Secondary | ICD-10-CM

## 2016-11-19 DIAGNOSIS — E039 Hypothyroidism, unspecified: Secondary | ICD-10-CM

## 2016-11-19 DIAGNOSIS — R7303 Prediabetes: Secondary | ICD-10-CM | POA: Diagnosis not present

## 2016-11-19 NOTE — Assessment & Plan Note (Signed)
Check uric acid today; avoid triggers

## 2016-11-19 NOTE — Assessment & Plan Note (Signed)
Check glucose and A1c 

## 2016-11-19 NOTE — Assessment & Plan Note (Signed)
Seeing specialist; he is doing bloodwork, Dr. Yaakov Guthrie

## 2016-11-19 NOTE — Patient Instructions (Signed)
Try to follow the DASH guidelines (DASH stands for Dietary Approaches to Stop Hypertension) Try to limit the sodium in your diet.  Ideally, consume less than 1.5 grams (less than 1,500mg ) per day. Do not add salt when cooking or at the table.  Check the sodium amount on labels when shopping, and choose items lower in sodium when given a choice. Avoid or limit foods that already contain a lot of sodium. Eat a diet rich in fruits and vegetables and whole grains. Check out the information at familydoctor.org entitled "Nutrition for Weight Loss: What You Need to Know about Fad Diets" Try to lose between 1-2 pounds per week by taking in fewer calories and burning off more calories You can succeed by limiting portions, limiting foods dense in calories and fat, becoming more active, and drinking 8 glasses of water a day (64 ounces) Don't skip meals, especially breakfast, as skipping meals may alter your metabolism Do not use over-the-counter weight loss pills or gimmicks that claim rapid weight loss A healthy BMI (or body mass index) is between 18.5 and 24.9 You can calculate your ideal BMI at the [employer] website ClubMonetize.fr We'll get you in the physical therapy

## 2016-11-19 NOTE — Assessment & Plan Note (Addendum)
Monitored by Dr. Loney Loh

## 2016-11-19 NOTE — Assessment & Plan Note (Signed)
Well-controlled; continue f/u with nephrologist

## 2016-11-19 NOTE — Progress Notes (Signed)
BP 124/62   Pulse 85   Temp 97.9 F (36.6 C) (Oral)   Resp 16   Wt 260 lb (117.9 kg)   SpO2 94%   BMI 47.55 kg/m    Subjective:    Patient ID: Tracy Bolton, female    DOB: 05/27/1940, 76 y.o.   MRN: [id number]  HPI: Tracy Bolton is a 76 y.o. female  Chief Complaint  Patient presents with  . Follow-up    6 months    HPI  Patient is here for f/u HTN; was seeing nephrologist locally; has not seen him in 3 years; urinating just fine; no foam in the urine; avoiding excess salt  Hypothyroidism; sees Dr. Loney Bolton about her thyroid; has goiter on the right; no problems with speaking or swallowing; was on medicine to see if it would shrink, but didn't change it Lab Results  Component Value Date   TSH 4.550 (H) 05/19/2015   Obesity; she is working on it, but not as active as she would like to be because she uses a walker; not free to move because of the sepsis; I offered PT to help her get stronger; she is not home-bound; throws her walker in the car and goes came fasting this morning, water and lemon  Gout; can't remember last gout flare on her medicine; avoiding foods that can triggers attacks; not sure about gout running in the family  Hyperlipidemia; it's in her list, but patient says she has never taken medicine for this and wasn't aware it was a problem; not many eggs; very seldom eats red meat; not much cheese  Prediabetes; not running strong in the family; not many sugary drinks; wheat bread instead of white, just once in a while  Depression screen [name] 2/9 11/19/2016 05/18/2016 11/14/2015 05/18/2015 09/06/2014  Decreased Interest 0 0 0 0 0  Down, Depressed, Hopeless 0 0 0 0 0  PHQ - 2 Score 0 0 0 0 0    Relevant past medical, surgical, family and social history reviewed Past Medical History:  Diagnosis Date  . Arthritis   . Gout, chronic   . Hyperlipidemia   . Hypertension   . Osteoporosis   . Thyroid disease    Past Surgical History:  Procedure Laterality  Date  . LUNG SURGERY Right 2004   biopsy normal   Family History  Problem Relation Age of Onset  . Bone cancer Father   . Thyroid disease Sister   . Breast cancer Neg Hx    Social History   Social History  . Marital status: Single    Spouse name: N/A  . Number of children: 2  . Years of education: N/A   Occupational History  . retired    Social History Main Topics  . Smoking status: Never Smoker  . Smokeless tobacco: Never Used  . Alcohol use No  . Drug use: No  . Sexual activity: No   Other Topics Concern  . Not on file   Social History Narrative  . No narrative on file    Interim medical history since last visit reviewed. Allergies and medications reviewed  Review of Systems Per HPI unless specifically indicated above     Objective:    BP 124/62   Pulse 85   Temp 97.9 F (36.6 C) (Oral)   Resp 16   Wt 260 lb (117.9 kg)   SpO2 94%   BMI 47.55 kg/m   Wt Readings from Last 3 Encounters:  11/19/16 260 lb (  117.9 kg)  05/18/16 260 lb (117.9 kg)  11/29/15 266 lb (120.7 kg)    Physical Exam  Constitutional: She appears well-developed and well-nourished. No distress.  Morbidly obese  HENT:  Head: Normocephalic and atraumatic.  Eyes: EOM are normal. No scleral icterus.  Neck: Thyromegaly (right side) present.  Cardiovascular: Normal rate, regular rhythm and normal heart sounds.   No murmur heard. Pulmonary/Chest: Effort normal and breath sounds normal. No respiratory distress. She has no wheezes.  Abdominal: Soft. Bowel sounds are normal. She exhibits no distension.  Musculoskeletal: Normal range of motion. She exhibits edema (puffiness around left lateral ankle; no pitting edema of either leg).       Left ankle: She exhibits swelling (nonpitting edema of left lateral malleolus).  Neurological: She is alert. She exhibits normal muscle tone.  Skin: Skin is warm and dry. She is not diaphoretic. No pallor.  preulcerative callus LEFT foot  Psychiatric:  She has a normal mood and affect. Judgment and thought content normal. Her mood appears not anxious. She does not exhibit a depressed mood.  Pleasant, good eye contact with examiner   Results for orders placed or performed in visit on 07/19/16  COMPLETE METABOLIC PANEL WITH GFR  Result Value Ref Range   Sodium 141 135 - 146 mmol/L   Potassium 4.6 3.5 - 5.3 mmol/L   Chloride 104 98 - 110 mmol/L   CO2 24 20 - 31 mmol/L   Glucose, Bld 140 (H) 65 - 99 mg/dL   BUN 17 7 - 25 mg/dL   Creat 0.91 0.60 - 0.93 mg/dL   Total Bilirubin 0.8 0.2 - 1.2 mg/dL   Alkaline Phosphatase 45 33 - 130 U/L   AST 31 10 - 35 U/L   ALT 31 (H) 6 - 29 U/L   Total Protein 7.3 6.1 - 8.1 g/dL   Albumin 4.0 3.6 - 5.1 g/dL   Calcium 9.5 8.6 - 10.4 mg/dL   Globulin 3.3 1.9 - 3.7 g/dL   AG Ratio 1.2 1.0 - 2.5 Ratio   BUN/Creatinine Ratio 18.7 6 - 22 Ratio   GFR, Est African American 72 >=60 mL/min   GFR, Est Non African American 63 >=60 mL/min      Assessment & Plan:   Problem List Items Addressed This Visit      Cardiovascular and Mediastinum   Hypertension goal BP (blood pressure) < 140/90 - Primary    Well-controlled; continue f/u with nephrologist      Relevant Orders   Microalbumin / creatinine urine ratio     Endocrine   Subclinical hypothyroidism    Monitored by Dr. Loney Bolton      Right-sided uninodular goiter    Seeing specialist; he is doing bloodwork, Dr. Yaakov Bolton        Musculoskeletal and Integument   Chronic gouty arthritis    Check uric acid today; avoid triggers      Relevant Orders   Uric acid     Other   Pre-diabetes    Check glucose and A1c      Relevant Orders   Hemoglobin A1c   Obesity, Class III, BMI 40-49.9 (morbid obesity) ([name])    Encouraged patient to work on weight loss; see AVS      Hyperlipidemia    Watching diet; reviewed last lipids, not needing medicine at this time      Encounter for medication monitoring    Check liver and kidneys       Relevant Orders   COMPLETE  METABOLIC PANEL WITH GFR    Other Visit Diagnoses    Weakness of both legs       secondary to sepsis; refer to PT   Relevant Orders   Ambulatory referral to Physical Therapy       Follow up plan: Return in about 6 months (around 05/22/2017) for twenty minute follow-up with fasting labs.  An after-visit summary was printed and given to the patient at [place].  Please see the patient instructions which may contain other information and recommendations beyond what is mentioned above in the assessment and plan.  No orders of the defined types were placed in this encounter.   Orders Placed This Encounter  Procedures  . Uric acid  . COMPLETE METABOLIC PANEL WITH GFR  . Hemoglobin A1c  . Microalbumin / creatinine urine ratio  . Ambulatory referral to Physical Therapy

## 2016-11-19 NOTE — Assessment & Plan Note (Addendum)
Watching diet; reviewed last lipids, not needing medicine at this time

## 2016-11-19 NOTE — Assessment & Plan Note (Signed)
Check liver and kidneys 

## 2016-11-19 NOTE — Assessment & Plan Note (Signed)
Encouraged patient to work on weight loss; see AVS

## 2016-11-20 ENCOUNTER — Other Ambulatory Visit: Payer: Self-pay | Admitting: Family Medicine

## 2016-11-20 LAB — URIC ACID: Uric Acid, Serum: 6.5 mg/dL (ref 2.5–7.0)

## 2016-11-20 LAB — COMPLETE METABOLIC PANEL WITH GFR
ALT: 28 U/L (ref 6–29)
AST: 28 U/L (ref 10–35)
Albumin: 4.1 g/dL (ref 3.6–5.1)
Alkaline Phosphatase: 44 U/L (ref 33–130)
BUN: 17 mg/dL (ref 7–25)
CO2: 20 mmol/L (ref 20–32)
Calcium: 9.4 mg/dL (ref 8.6–10.4)
Chloride: 103 mmol/L (ref 98–110)
Creat: 0.98 mg/dL — ABNORMAL HIGH (ref 0.60–0.93)
GFR, Est African American: 66 mL/min (ref >=60)
GFR, Est Non African American: 57 mL/min — ABNORMAL LOW (ref >=60)
Glucose, Bld: 172 mg/dL — ABNORMAL HIGH (ref 65–99)
Potassium: 4.2 mmol/L (ref 3.5–5.3)
Sodium: 139 mmol/L (ref 135–146)
Total Bilirubin: 0.8 mg/dL (ref 0.2–1.2)
Total Protein: 7.3 g/dL (ref 6.1–8.1)

## 2016-11-20 LAB — MICROALBUMIN / CREATININE URINE RATIO
Creatinine, Urine: 288 mg/dL (ref 20–320)
Microalb Creat Ratio: 32 mcg/mg creat — ABNORMAL HIGH (ref <30)
Microalb, Ur: 9.1 mg/dL

## 2016-11-20 LAB — HEMOGLOBIN A1C
Hgb A1c MFr Bld: 6 % — ABNORMAL HIGH (ref <5.7)
Mean Plasma Glucose: 126 mg/dL

## 2017-01-11 ENCOUNTER — Other Ambulatory Visit: Payer: Self-pay | Admitting: Family Medicine

## 2017-01-11 DIAGNOSIS — I10 Essential (primary) hypertension: Secondary | ICD-10-CM

## 2017-01-11 DIAGNOSIS — M1000 Idiopathic gout, unspecified site: Secondary | ICD-10-CM

## 2017-02-21 ENCOUNTER — Ambulatory Visit (INDEPENDENT_AMBULATORY_CARE_PROVIDER_SITE_OTHER): Payer: Medicare Other | Admitting: Family Medicine

## 2017-02-21 ENCOUNTER — Encounter: Payer: Self-pay | Admitting: Family Medicine

## 2017-02-21 DIAGNOSIS — E782 Mixed hyperlipidemia: Secondary | ICD-10-CM | POA: Diagnosis not present

## 2017-02-21 DIAGNOSIS — M810 Age-related osteoporosis without current pathological fracture: Secondary | ICD-10-CM

## 2017-02-21 DIAGNOSIS — R29898 Other symptoms and signs involving the musculoskeletal system: Secondary | ICD-10-CM | POA: Diagnosis not present

## 2017-02-21 DIAGNOSIS — R7303 Prediabetes: Secondary | ICD-10-CM

## 2017-02-21 MED ORDER — METFORMIN HCL ER 500 MG PO TB24: ORAL_TABLET | ORAL | 0 refills | Status: DC

## 2017-02-21 NOTE — Progress Notes (Signed)
BP 132/78 (BP Location: Left Wrist)   Pulse 84   Temp 98.3 F (36.8 C) (Oral)   Resp 16   Wt 260 lb 9.6 oz (118.2 kg)   SpO2 92%   BMI 47.66 kg/m    Subjective:    Patient ID: Tracy Bolton, female    DOB: [birth year], 76 y.o.   MRN: [id number]  HPI: Tracy Bolton is a 76 y.o. female  Chief Complaint  Patient presents with  . Follow-up    HPI Patient is here for f/u; no medical excitement since last visit  She has hypertension; she checks at home, usually around 120s to 140; pretty happy with the medicine, no side effects; tries to avoid salt; avoids decongestants  Obesity; BMI today is 47+; she went to rehab and she was exercising; both lower legs, to build up strength; using rolling walker; she would like to get back on the equipment in the spring  She has high cholesterol; tries to limit cheese and fatty meats; eats mostly fish and chicken  Osteoporosis; last DEXA March 2016; taking vitamin D and calcium; practices careful fall precautions; got rid of throw rugs  Right sided nodular goiter; 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 Yaakov Guthrie; no problems with swallowing  Prediabetes; avoid sugary drinks and sweets; used to take metformin, but not now  Depression screen [facility] 2/9 02/21/2017 11/19/2016 05/18/2016 11/14/2015 05/18/2015  Decreased Interest 0 0 0 0 0  Down, Depressed, Hopeless 0 0 0 0 0  PHQ - 2 Score 0 0 0 0 0    Relevant past medical, surgical, family and social history reviewed Past Medical History:  Diagnosis Date  . Arthritis   . Gout, chronic   . Hyperlipidemia   . Hypertension   . Osteoporosis   . Thyroid disease    Past Surgical History:  Procedure Laterality Date  . LUNG SURGERY Right 2004   biopsy normal   Family History  Problem Relation Age of Onset  . Bone cancer Father   . Thyroid disease Sister   . Breast cancer Neg Hx    Social History   Tobacco Use  . Smoking status: Never Smoker  . Smokeless  tobacco: Never Used  Substance Use Topics  . Alcohol use: No    Alcohol/week: 0.0 oz  . Drug use: No    Interim medical history since last visit reviewed. Allergies and medications reviewed  Review of Systems Per HPI unless specifically indicated above     Objective:    BP 132/78 (BP Location: Left Wrist)   Pulse 84   Temp 98.3 F (36.8 C) (Oral)   Resp 16   Wt 260 lb 9.6 oz (118.2 kg)   SpO2 92%   BMI 47.66 kg/m   Wt Readings from Last 3 Encounters:  02/21/17 260 lb 9.6 oz (118.2 kg)  11/19/16 260 lb (117.9 kg)  05/18/16 260 lb (117.9 kg)    Physical Exam  Constitutional: She appears well-developed and well-nourished. No distress.  Morbidly obese, weight stable  HENT:  Head: Normocephalic and atraumatic.  Eyes: EOM are normal. No scleral icterus.  Neck: Thyromegaly (right side) present.  Cardiovascular: Normal rate, regular rhythm and normal heart sounds.  No murmur heard. Pulmonary/Chest: Effort normal and breath sounds normal. No respiratory distress. She has no wheezes.  Abdominal: Soft. Bowel sounds are normal. She exhibits no distension.  Musculoskeletal: Normal range of motion. She exhibits no edema.  Left ankle: She exhibits swelling (nonpitting edema of left lateral malleolus).  Neurological: She is alert.  Skin: Skin is warm and dry. She is not diaphoretic. No pallor.  preulcerative callus LEFT foot  Psychiatric: She has a normal mood and affect. Judgment and thought content normal. Her mood appears not anxious. She does not exhibit a depressed mood.  Pleasant, good eye contact with examiner    Results for orders placed or performed in visit on 11/19/16  Uric acid  Result Value Ref Range   Uric Acid, Serum 6.5 2.5 - 7.0 mg/dL  COMPLETE METABOLIC PANEL WITH GFR  Result Value Ref Range   Sodium 139 135 - 146 mmol/L   Potassium 4.2 3.5 - 5.3 mmol/L   Chloride 103 98 - 110 mmol/L   CO2 20 20 - 32 mmol/L   Glucose, Bld 172 (H) 65 - 99 mg/dL   BUN  17 7 - 25 mg/dL   Creat 0.98 (H) 0.60 - 0.93 mg/dL   Total Bilirubin 0.8 0.2 - 1.2 mg/dL   Alkaline Phosphatase 44 33 - 130 U/L   AST 28 10 - 35 U/L   ALT 28 6 - 29 U/L   Total Protein 7.3 6.1 - 8.1 g/dL   Albumin 4.1 3.6 - 5.1 g/dL   Calcium 9.4 8.6 - 10.4 mg/dL   GFR, Est African American 66 >=60 mL/min   GFR, Est Non African American 57 (L) >=60 mL/min  Hemoglobin A1c  Result Value Ref Range   Hgb A1c MFr Bld 6.0 (H) <5.7 %   Mean Plasma Glucose 126 mg/dL  Microalbumin / creatinine urine ratio  Result Value Ref Range   Creatinine, Urine 288 20 - 320 mg/dL   Microalb, Ur 9.1 Not estab mg/dL   Microalb Creat Ratio 32 (H) <30 mcg/mg creat      Assessment & Plan:   Problem List Items Addressed This Visit      Nervous and Auditory   Weakness of both lower extremities    Patient would like to pursue PT again in the spring; she may call for the order when ready        Musculoskeletal and Integument   OP (osteoporosis)    Order DEXA scan now; patient chooses to wait and have done with mammogram; fall precautions; vit D and calcium      Relevant Orders   DG Bone Density     Other   Pre-diabetes    Discussed metformin for weight loss and glucose control      Hyperlipidemia    Check fasting lipids          Follow up plan: Return in about 3 months (around 05/23/2017) for twenty minute follow-up with fasting labs; schedule a Medicare Wellness visit  60 minutes together.  An after-visit summary was printed and given to the patient at [facility].  Please see the patient instructions which may contain other information and recommendations beyond what is mentioned above in the assessment and plan.  Meds ordered this encounter  Medications  . metFORMIN (GLUCOPHAGE XR) 500 MG 24 hr tablet    Sig: One by mouth daily for one week, then two a day    Dispense:  53 tablet    Refill:  0    Orders Placed This Encounter  Procedures  . DG Bone Density

## 2017-02-21 NOTE — Assessment & Plan Note (Signed)
Order DEXA scan now; patient chooses to wait and have done with mammogram; fall precautions; vit D and calcium

## 2017-02-21 NOTE — Assessment & Plan Note (Signed)
Check fasting lipids 

## 2017-02-21 NOTE — Assessment & Plan Note (Signed)
Patient would like to pursue PT again in the spring; she may call for the order when ready

## 2017-02-21 NOTE — Assessment & Plan Note (Signed)
Discussed metformin for weight loss and glucose control

## 2017-02-21 NOTE — Patient Instructions (Addendum)
Check out the information at familydoctor.org entitled "Nutrition for Weight Loss: What You Need to Know about Fad Diets" Try to lose between 1-2 pounds per week by taking in fewer calories and burning off more calories You can succeed by limiting portions, limiting foods dense in calories and fat, becoming more active, and drinking 8 glasses of water a day (64 ounces) Don't skip meals, especially breakfast, as skipping meals may alter your metabolism Do not use over-the-counter weight loss pills or gimmicks that claim rapid weight loss A healthy BMI (or body mass index) is between 18.5 and 24.9 You can calculate your ideal BMI at the [place] website ClubMonetize.fr  Start the new medicine and call for refill of the metformin; we can go up to 3 pills a day if you are tolerating it   Preventing Type 2 Diabetes Mellitus Type 2 diabetes (type 2 diabetes mellitus) is a long-term (chronic) disease that affects blood sugar (glucose) levels. Normally, a hormone called insulin allows glucose to enter cells in the body. The cells use glucose for energy. In type 2 diabetes, one or both of these problems may be present:  The body does not make enough insulin.  The body does not respond properly to insulin that it makes (insulin resistance).  Insulin resistance or lack of insulin causes excess glucose to build up in the blood instead of going into cells. As a result, high blood glucose (hyperglycemia) develops, which can cause many complications. Being overweight or obese and having an inactive (sedentary) lifestyle can increase your risk for diabetes. Type 2 diabetes can be delayed or prevented by making certain nutrition and lifestyle changes. What nutrition changes can be made?  Eat healthy meals and snacks regularly. Keep a healthy snack with you for when you get hungry between meals, such as fruit or a handful of nuts.  Eat lean meats and proteins that  are low in saturated fats, such as chicken, fish, egg whites, and beans. Avoid processed meats.  Eat plenty of fruits and vegetables and plenty of grains that have not been processed (whole grains). It is recommended that you eat: ? 1?2 cups of fruit every day. ? 2?3 cups of vegetables every day. ? 6?8 oz of whole grains every day, such as oats, whole wheat, bulgur, brown rice, quinoa, and millet.  Eat low-fat dairy products, such as milk, yogurt, and cheese.  Eat foods that contain healthy fats, such as nuts, avocado, olive oil, and canola oil.  Drink water throughout the day. Avoid drinks that contain added sugar, such as soda or sweet tea.  Follow instructions from your health care provider about specific eating or drinking restrictions.  Control how much food you eat at a time (portion size). ? Check food labels to find out the serving sizes of foods. ? Use a kitchen scale to weigh amounts of foods.  Saute or steam food instead of frying it. Cook with water or broth instead of oils or butter.  Limit your intake of: ? Salt (sodium). Have no more than 1 tsp (2,400 mg) of sodium a day. If you have heart disease or high blood pressure, have less than ? tsp (1,500 mg) of sodium a day. ? Saturated fat. This is fat that is solid at room temperature, such as butter or fat on meat. What lifestyle changes can be made?  Activity  Do moderate-intensity physical activity for at least 30 minutes on at least 5 days of the week, or as much as told by  your health care provider.  Ask your health care provider what activities are safe for you. A mix of physical activities may be best, such as walking, swimming, cycling, and strength training.  Try to add physical activity into your day. For example: ? Park in spots that are farther away than usual, so that you walk more. For example, park in a far corner of the parking lot when you go to the office or the grocery store. ? Take a walk during  your lunch break. ? Use stairs instead of elevators or escalators. Weight Loss  Lose weight as directed. Your health care provider can determine how much weight loss is best for you and can help you lose weight safely.  If you are overweight or obese, you may be instructed to lose at least 5?7 % of your body weight. Alcohol and Tobacco   Limit alcohol intake to no more than 1 drink a day for nonpregnant women and 2 drinks a day for men. One drink equals 12 oz of beer, 5 oz of wine, or 1 oz of hard liquor.  Do not use any tobacco products, such as cigarettes, chewing tobacco, and e-cigarettes. If you need help quitting, ask your health care provider. Work With [facility] Provider  Have your blood glucose tested regularly, as told by your health care provider.  Discuss your risk factors and how you can reduce your risk for diabetes.  Get screening tests as told by your health care provider. You may have screening tests regularly, especially if you have certain risk factors for type 2 diabetes.  Make an appointment with a diet and nutrition specialist (registered dietitian). A registered dietitian can help you make a healthy eating plan and can help you understand portion sizes and food labels. Why are these changes important?  It is possible to prevent or delay type 2 diabetes and related health problems by making lifestyle and nutrition changes.  It can be difficult to recognize signs of type 2 diabetes. The best way to avoid possible damage to your body is to take actions to prevent the disease before you develop symptoms. What can happen if changes are not made?  Your blood glucose levels may keep increasing. Having high blood glucose for a long time is dangerous. Too much glucose in your blood can damage your blood vessels, heart, kidneys, nerves, and eyes.  You may develop prediabetes or type 2 diabetes. Type 2 diabetes can lead to many chronic health problems and  complications, such as: ? Heart disease. ? Stroke. ? Blindness. ? Kidney disease. ? Depression. ? Poor circulation in the feet and legs, which could lead to surgical removal (amputation) in severe cases. Where to find support:  Ask your health care provider to recommend a registered dietitian, diabetes educator, or weight loss program.  Look for local or online weight loss groups.  Join a gym, fitness club, or outdoor activity group, such as a walking club. Where to find more information: To learn more about diabetes and diabetes prevention, visit:  American Diabetes Association (ADA): www.diabetes.[employer] of Diabetes and Digestive and Kidney Diseases: FindSpin.nl  To learn more about healthy eating, visit:  The U.S. Department of Agriculture Scientist, research (physical sciences)), Choose My Plate: http://wiley-williams.com/  Office of Disease Prevention and Health Promotion (ODPHP), Dietary Guidelines: SurferLive.at  Summary  You can reduce your risk for type 2 diabetes by increasing your physical activity, eating healthy foods, and losing weight as directed.  Talk with your health  care provider about your risk for type 2 diabetes. Ask about any blood tests or screening tests that you need to have. This information is not intended to replace advice given to you by your health care provider. Make sure you discuss any questions you have with your health care provider. Document Released: 07/04/2015 Document Revised: 08/18/2015 Document Reviewed: 05/03/2015 Elsevier Interactive Patient Education  [employer].

## 2017-03-12 ENCOUNTER — Telehealth: Payer: Self-pay

## 2017-03-12 NOTE — Telephone Encounter (Signed)
Pt notified   Copied from [place] [id number]. Topic: General - Other >> Mar 12, 2017  1:38 PM Arletha Grippe wrote: Reason for CRM: pt needs letter to be excused from jury duty on Jan 14th.  Pt is on a walker and can not use stairs Cb # [phone]

## 2017-03-12 NOTE — Telephone Encounter (Signed)
Using a walker is not a valid excuse to miss jury duty per my understanding However, her age alone will get her out of jury duty if she does not wish to serve her civic duty

## 2017-03-24 ENCOUNTER — Other Ambulatory Visit: Payer: Self-pay | Admitting: Family Medicine

## 2017-03-25 NOTE — Telephone Encounter (Signed)
Last Cr reviewed; Rx approved 

## 2017-05-12 ENCOUNTER — Other Ambulatory Visit: Payer: Self-pay | Admitting: Family Medicine

## 2017-05-21 ENCOUNTER — Encounter: Payer: Self-pay | Admitting: Family Medicine

## 2017-05-21 ENCOUNTER — Ambulatory Visit (INDEPENDENT_AMBULATORY_CARE_PROVIDER_SITE_OTHER): Payer: Medicare Other | Admitting: Family Medicine

## 2017-05-21 VITALS — BP 120/78 | HR 90 | Temp 98.2°F | Resp 14 | Ht 65.0 in | Wt 254.2 lb

## 2017-05-21 DIAGNOSIS — Z1231 Encounter for screening mammogram for malignant neoplasm of breast: Secondary | ICD-10-CM | POA: Diagnosis not present

## 2017-05-21 DIAGNOSIS — Z0000 Encounter for general adult medical examination without abnormal findings: Secondary | ICD-10-CM | POA: Insufficient documentation

## 2017-05-21 DIAGNOSIS — Z13820 Encounter for screening for osteoporosis: Secondary | ICD-10-CM | POA: Diagnosis not present

## 2017-05-21 DIAGNOSIS — Z9181 History of falling: Secondary | ICD-10-CM | POA: Diagnosis not present

## 2017-05-21 DIAGNOSIS — Z1239 Encounter for other screening for malignant neoplasm of breast: Secondary | ICD-10-CM

## 2017-05-21 DIAGNOSIS — Z789 Other specified health status: Secondary | ICD-10-CM | POA: Insufficient documentation

## 2017-05-21 DIAGNOSIS — Z23 Encounter for immunization: Secondary | ICD-10-CM | POA: Diagnosis not present

## 2017-05-21 NOTE — Progress Notes (Signed)
Patient: Tracy Bolton, Female    DOB: 07/17/1940, 77 y.o.   MRN: [id number]  Visit Date: 05/21/2017  Today's Provider: Enid Derry, MD   Chief Complaint  Patient presents with  . Medicare Wellness  . Follow-up    Subjective:   Tracy Bolton is a 77 y.o. female who presents today for her Subsequent Annual Wellness Visit.  USPSTF grade A and B recommendations Depression:  Depression screen [facility] 2/9 05/21/2017 02/21/2017 11/19/2016 05/18/2016 11/14/2015  Decreased Interest 0 0 0 0 0  Down, Depressed, Hopeless 0 0 0 0 0  PHQ - 2 Score 0 0 0 0 0   Hypertension: BP Readings from Last 3 Encounters:  05/21/17 120/78  02/21/17 132/78  11/19/16 124/62   Obesity: Wt Readings from Last 3 Encounters:  05/21/17 254 lb 3.2 oz (115.3 kg)  02/21/17 260 lb 9.6 oz (118.2 kg)  11/19/16 260 lb (117.9 kg)   BMI Readings from Last 3 Encounters:  05/21/17 42.30 kg/m  02/21/17 47.66 kg/m  11/19/16 47.55 kg/m     Skin cancer: no worrisome moles Lung cancer:  No hx of smoking Breast cancer: doing SBE once a week; last mammo; DEXA ordered Colorectal cancer: last colonoscopy 2016; fine per patient; no blood in stools, no fam hx of colorectal cancer Cervical cancer screening: n/a HIV, hep B, hep C: not interested STD testing and prevention (chl/gon/syphilis): not intersted Intimate partner violence: no abuse Contraception: n/a Osteoporosis: last was 2016; due now; taking alendronate; DEXA ordered Fall prevention/vitamin D: discussed; no wires or rugs, took care of that Immunizations: has not had a flu shot; got sick once before; has had PPSV-23 Diet: watching what she eats; veggies 2x a day, fruits 2-3x a day; cooks mostly, frozen and fresh; drinks mostly water; no caffeine Exercise: using walker; does some exercises at home; was going to [place] physical therapy, continuing what she was taught Alcohol: no Tobacco use: nonsmoker AAA: n/a Aspirin: taking daily aspirin, 81 mg Glucose:   Glucose  Date Value Ref Range Status  09/15/2013 123 (H) 65 - 99 mg/dL Final  08/31/2013 136 (H) 65 - 99 mg/dL Final  08/24/2013 107 (H) 65 - 99 mg/dL Final   Glucose, Bld  Date Value Ref Range Status  11/19/2016 172 (H) 65 - 99 mg/dL Final  07/19/2016 140 (H) 65 - 99 mg/dL Final  05/18/2016 156 (H) 65 - 99 mg/dL Final   Lipids:  Lab Results  Component Value Date   CHOL 167 05/18/2016   CHOL 156 11/14/2015   CHOL 156 05/19/2015   Lab Results  Component Value Date   HDL 42 (L) 05/18/2016   HDL 45 (L) 11/14/2015   HDL 44 05/19/2015   Lab Results  Component Value Date   LDLCALC 91 05/18/2016   LDLCALC 87 11/14/2015   LDLCALC 83 05/19/2015   Lab Results  Component Value Date   TRIG 172 (H) 05/18/2016   TRIG 121 11/14/2015   TRIG 147 05/19/2015   Lab Results  Component Value Date   CHOLHDL 4.0 05/18/2016   CHOLHDL 3.5 11/14/2015   CHOLHDL 3.5 05/19/2015   No results found for: LDLDIRECT   Review of Systems  Past Medical History:  Diagnosis Date  . Arthritis   . Gout, chronic   . Hyperlipidemia   . Hypertension   . Osteoporosis   . Thyroid disease     Past Surgical History:  Procedure Laterality Date  . LUNG SURGERY Right 2004   biopsy normal  Family History  Problem Relation Age of Onset  . Bone cancer Father 86  . Thyroid disease Sister   . Cancer Mother 97       brain tumor  . Breast cancer Neg Hx     Social History   Tobacco Use  . Smoking status: Never Smoker  . Smokeless tobacco: Never Used  Substance Use Topics  . Alcohol use: No    Alcohol/week: 0.0 oz  . Drug use: No    Outpatient Encounter Medications as of 05/21/2017  Medication Sig Note  . alendronate (FOSAMAX) 70 MG tablet TAKE 1 TABLET BY MOUTH ONCE A WEEK TAKE WITH FULL GLASS OF WATER ON EMPTY STOMACH   . allopurinol (ZYLOPRIM) 100 MG tablet TAKE 1 TABLET (100 MG TOTAL) BY MOUTH DAILY.   Marland Kitchen amLODipine (NORVASC) 10 MG tablet TAKE 1 TABLET (10 MG TOTAL) BY MOUTH DAILY.    Marland Kitchen aspirin 81 MG tablet Take 81 mg by mouth. 09/06/2014: Received from: [facility]  . atenolol (TENORMIN) 50 MG tablet TAKE 1 TABLET (50 MG TOTAL) BY MOUTH DAILY.   . Calcium Carbonate-Vitamin D 600-400 MG-UNIT per tablet Take by mouth. 09/06/2014: Received from: [facility]  . metFORMIN (GLUCOPHAGE-XR) 500 MG 24 hr tablet Take 2 tablets (1,000 mg total) by mouth daily with breakfast. One by mouth daily for one week, then two a day   . Multiple Vitamin (MULTI-VITAMINS) TABS Take by mouth. 09/06/2014: Received from: [facility]  . nystatin [facility]) powder Use sufficient amount TID on affected areas of skin (Patient taking differently: as needed. Use sufficient amount TID on affected areas of skin)   . senna ([facility] SENNA) 8.6 MG tablet Take 1 tablet (8.6 mg total) by mouth 2 (two) times daily as needed for constipation.    No facility-administered encounter medications on file as of 05/21/2017.     Functional Ability / Safety Screening 1.  Was the timed Get Up and Go test less than 12 seconds?  no, has to use walker, slow deliberate gait; recommended PT 2.  Does the patient need help with the phone, transportation, shopping,      preparing meals, housework, laundry, medications, or managing money?  no 3.  Does the patient's home have:  loose throw rugs in the hallway?   no      Grab bars in the bathroom? no; has handrail for tub      Handrails on the stairs?   no stairs      Good lighting?   yes 4.  Has the patient noticed any hearing difficulties?   no  Advanced Directives Does patient have a HCPOA?    no If yes, name and contact information:  Does patient have a living will or MOST form?  yes  Discussed; would want CPR if heart stops, would want intubation if needed  Immunizations: not interested in shingrix or flu vaccine  Fall Risk Assessment See under rooming  Depression Screen See under rooming Depression screen [facility] 2/9 05/21/2017 02/21/2017 11/19/2016 05/18/2016 11/14/2015   Decreased Interest 0 0 0 0 0  Down, Depressed, Hopeless 0 0 0 0 0  PHQ - 2 Score 0 0 0 0 0    Objective:   Vitals: BP 120/78 (BP Location: Right Arm, Patient Position: Sitting, Cuff Size: Normal)   Pulse 90   Temp 98.2 F (36.8 C) (Oral)   Resp 14   Ht 5\' 5"  (1.651 m)   Wt 254 lb 3.2 oz (115.3 kg)  SpO2 90%   BMI 42.30 kg/m  Body mass index is 42.3 kg/m. No exam data present  Physical Exam Mood/affect:  euthymic Appearance:  Obese, casually dressed  6CIT Screen 05/21/2017  What Year? 0 points  What month? 0 points  What time? 0 points  Count back from 20 0 points  Months in reverse 0 points  Repeat phrase 0 points  Total Score 0    Assessment & Plan:     Annual Wellness Visit  Reviewed patient's Family Medical History Reviewed and updated list of patient's medical providers; no longer seeing colindres Assessment of cognitive impairment was done Assessed patient's functional ability Established a written schedule for health screening [place] Completed and Reviewed  Exercise Activities and Dietary recommendations Goals    . Weight (lb) < 230 lb (104.3 kg) (pt-stated)       Immunization History  Administered Date(s) Administered  . Pneumococcal Conjugate-13 05/21/2017  . Pneumococcal Polysaccharide-23 12/31/2013, 05/18/2015    Health Maintenance  Topic Date Due  . INFLUENZA VACCINE  07/17/2017 (Originally 10/24/2016)  . DEXA SCAN  02/21/2018 (Originally 06/01/2016)  . MAMMOGRAM  06/12/2017  . TETANUS/TDAP  06/25/2023  . COLONOSCOPY  07/12/2024  . PNA vac Low Risk Adult  Addressed    Discussed health benefits of physical activity, and encouraged her to engage in regular exercise appropriate for her age and condition.   No orders of the defined types were placed in this encounter.   Current Outpatient Medications:  .  alendronate (FOSAMAX) 70 MG tablet, TAKE 1 TABLET BY MOUTH ONCE A WEEK TAKE WITH FULL GLASS OF WATER ON EMPTY  STOMACH, Disp: 12 tablet, Rfl: 3 .  allopurinol (ZYLOPRIM) 100 MG tablet, TAKE 1 TABLET (100 MG TOTAL) BY MOUTH DAILY., Disp: 90 tablet, Rfl: 3 .  amLODipine (NORVASC) 10 MG tablet, TAKE 1 TABLET (10 MG TOTAL) BY MOUTH DAILY., Disp: 90 tablet, Rfl: 3 .  aspirin 81 MG tablet, Take 81 mg by mouth., Disp: , Rfl:  .  atenolol (TENORMIN) 50 MG tablet, TAKE 1 TABLET (50 MG TOTAL) BY MOUTH DAILY., Disp: 90 tablet, Rfl: 3 .  Calcium Carbonate-Vitamin D 600-400 MG-UNIT per tablet, Take by mouth., Disp: , Rfl:  .  metFORMIN (GLUCOPHAGE-XR) 500 MG 24 hr tablet, Take 2 tablets (1,000 mg total) by mouth daily with breakfast. One by mouth daily for one week, then two a day, Disp: 60 tablet, Rfl: 5 .  Multiple Vitamin (MULTI-VITAMINS) TABS, Take by mouth., Disp: , Rfl:  .  nystatin (NYAMYC) powder, Use sufficient amount TID on affected areas of skin (Patient taking differently: as needed. Use sufficient amount TID on affected areas of skin), Disp: 60 g, Rfl: 2 .  senna ([employer] SENNA) 8.6 MG tablet, Take 1 tablet (8.6 mg total) by mouth 2 (two) times daily as needed for constipation., Disp: 180 tablet, Rfl: 1 There are no discontinued medications.  Next Medicare Wellness Visit in 12+ months  Problem List Items Addressed This Visit      Other   Preventative health care - Primary    USPSTF grade A and B recommendations reviewed with patient; age-appropriate recommendations, preventive care, screening tests, etc discussed and encouraged; healthy living encouraged; see AVS for patient education given to patient       Obesity, Class III, BMI 40-49.9 (morbid obesity) ([place])    Patient will be working on weight loss      Full code status    Discussed today with patient; she wishes  to be a full code      Relevant Orders   Full code    Other Visit Diagnoses    Screening for breast cancer       Relevant Orders   MM Digital Screening   Screening for osteoporosis       Relevant Orders   DG Bone Density    Risk for falls       patient does not want referral to PT at this time   Need for vaccination against Streptococcus pneumoniae using pneumococcal conjugate vaccine 13       offered and given today

## 2017-05-21 NOTE — Progress Notes (Signed)
BP 120/78 (BP Location: Right Arm, Patient Position: Sitting, Cuff Size: Normal)   Pulse 90   Temp 98.2 F (36.8 C) (Oral)   Resp 14   Ht 5\' 5"  (1.651 m)   Wt 254 lb 3.2 oz (115.3 kg)   SpO2 90%   BMI 42.30 kg/m    Subjective:    Patient ID: Tracy Bolton, female    DOB: [birth year], 77 y.o.   MRN: [id number]  HPI: Tracy Bolton is a 77 y.o. female  Chief Complaint  Patient presents with  . Medicare Wellness  . Follow-up    HPI   Depression screen [facility] 2/9 05/21/2017 02/21/2017 11/19/2016 05/18/2016 11/14/2015  Decreased Interest 0 0 0 0 0  Down, Depressed, Hopeless 0 0 0 0 0  PHQ - 2 Score 0 0 0 0 0    Relevant past medical, surgical, family and social history reviewed Past Medical History:  Diagnosis Date  . Arthritis   . Gout, chronic   . Hyperlipidemia   . Hypertension   . Osteoporosis   . Thyroid disease    Past Surgical History:  Procedure Laterality Date  . LUNG SURGERY Right 2004   biopsy normal   Family History  Problem Relation Age of Onset  . Bone cancer Father   . Thyroid disease Sister   . Breast cancer Neg Hx    Social History   Tobacco Use  . Smoking status: Never Smoker  . Smokeless tobacco: Never Used  Substance Use Topics  . Alcohol use: No    Alcohol/week: 0.0 oz  . Drug use: No    Interim medical history since last visit reviewed. Allergies and medications reviewed  Review of Systems Per HPI unless specifically indicated above     Objective:    BP 120/78 (BP Location: Right Arm, Patient Position: Sitting, Cuff Size: Normal)   Pulse 90   Temp 98.2 F (36.8 C) (Oral)   Resp 14   Ht 5\' 5"  (1.651 m)   Wt 254 lb 3.2 oz (115.3 kg)   SpO2 90%   BMI 42.30 kg/m   Wt Readings from Last 3 Encounters:  05/21/17 254 lb 3.2 oz (115.3 kg)  02/21/17 260 lb 9.6 oz (118.2 kg)  11/19/16 260 lb (117.9 kg)    Physical Exam  Results for orders placed or performed in visit on 11/19/16  Uric acid  Result Value Ref Range   Uric  Acid, Serum 6.5 2.5 - 7.0 mg/dL  COMPLETE METABOLIC PANEL WITH GFR  Result Value Ref Range   Sodium 139 135 - 146 mmol/L   Potassium 4.2 3.5 - 5.3 mmol/L   Chloride 103 98 - 110 mmol/L   CO2 20 20 - 32 mmol/L   Glucose, Bld 172 (H) 65 - 99 mg/dL   BUN 17 7 - 25 mg/dL   Creat 0.98 (H) 0.60 - 0.93 mg/dL   Total Bilirubin 0.8 0.2 - 1.2 mg/dL   Alkaline Phosphatase 44 33 - 130 U/L   AST 28 10 - 35 U/L   ALT 28 6 - 29 U/L   Total Protein 7.3 6.1 - 8.1 g/dL   Albumin 4.1 3.6 - 5.1 g/dL   Calcium 9.4 8.6 - 10.4 mg/dL   GFR, Est African American 66 >=60 mL/min   GFR, Est Non African American 57 (L) >=60 mL/min  Hemoglobin A1c  Result Value Ref Range   Hgb A1c MFr Bld 6.0 (H) <5.7 %   Mean Plasma Glucose 126 mg/dL  Microalbumin / creatinine urine ratio  Result Value Ref Range   Creatinine, Urine 288 20 - 320 mg/dL   Microalb, Ur 9.1 Not estab mg/dL   Microalb Creat Ratio 32 (H) <30 mcg/mg creat      Assessment & Plan:   Problem List Items Addressed This Visit    None    Visit Diagnoses    Screening for breast cancer    -  Primary   Relevant Orders   MM Digital Screening   Screening for osteoporosis       Relevant Orders   DG Bone Density       Follow up plan: No Follow-up on file.  An after-visit summary was printed and given to the patient at [facility].  Please see the patient instructions which may contain other information and recommendations beyond what is mentioned above in the assessment and plan.  No orders of the defined types were placed in this encounter.   Orders Placed This Encounter  Procedures  . MM Digital Screening  . DG Bone Density

## 2017-05-21 NOTE — Assessment & Plan Note (Signed)
Discussed today with patient; she wishes to be a full code

## 2017-05-21 NOTE — Patient Instructions (Addendum)
Fall Prevention in the Home °Falls can cause injuries. They can happen to people of all ages. There are many things you can do to make your home safe and to help prevent falls. °What can I do on the outside of my home? °· Regularly fix the edges of walkways and driveways and fix any cracks. °· Remove anything that might make you trip as you walk through a door, such as a raised step or threshold. °· Trim any bushes or trees on the path to your home. °· Use bright outdoor lighting. °· Clear any walking paths of anything that might make someone trip, such as rocks or tools. °· Regularly check to see if handrails are loose or broken. Make sure that both sides of any steps have handrails. °· Any raised decks and porches should have guardrails on the edges. °· Have any leaves, snow, or ice cleared regularly. °· Use sand or salt on walking paths during winter. °· Clean up any spills in your garage right away. This includes oil or grease spills. °What can I do in the bathroom? °· Use night lights. °· Install grab bars by the toilet and in the tub and shower. Do not use towel bars as grab bars. °· Use non-skid mats or decals in the tub or shower. °· If you need to sit down in the shower, use a plastic, non-slip stool. °· Keep the floor dry. Clean up any water that spills on the floor as soon as it happens. °· Remove soap buildup in the tub or shower regularly. °· Attach bath mats securely with double-sided non-slip rug tape. °· Do not have throw rugs and other things on the floor that can make you trip. °What can I do in the bedroom? °· Use night lights. °· Make sure that you have a light by your bed that is easy to reach. °· Do not use any sheets or blankets that are too big for your bed. They should not hang down onto the floor. °· Have a firm chair that has side arms. You can use this for support while you get dressed. °· Do not have throw rugs and other things on the floor that can make you trip. °What can I do in the  kitchen? °· Clean up any spills right away. °· Avoid walking on wet floors. °· Keep items that you use a lot in easy-to-reach places. °· If you need to reach something above you, use a strong step stool that has a grab bar. °· Keep electrical cords out of the way. °· Do not use floor polish or wax that makes floors slippery. If you must use wax, use non-skid floor wax. °· Do not have throw rugs and other things on the floor that can make you trip. °What can I do with my stairs? °· Do not leave any items on the stairs. °· Make sure that there are handrails on both sides of the stairs and use them. Fix handrails that are broken or loose. Make sure that handrails are as long as the stairways. °· Check any carpeting to make sure that it is firmly attached to the stairs. Fix any carpet that is loose or worn. °· Avoid having throw rugs at the top or bottom of the stairs. If you do have throw rugs, attach them to the floor with carpet tape. °· Make sure that you have a light switch at the top of the stairs and the bottom of the stairs. If you do   not have them, ask someone to add them for you. What else can I do to help prevent falls?  Wear shoes that: ? Do not have high heels. ? Have rubber bottoms. ? Are comfortable and fit you well. ? Are closed at the toe. Do not wear sandals.  If you use a stepladder: ? Make sure that it is fully opened. Do not climb a closed stepladder. ? Make sure that both sides of the stepladder are locked into place. ? Ask someone to hold it for you, if possible.  Clearly mark and make sure that you can see: ? Any grab bars or handrails. ? First and last steps. ? Where the edge of each step is.  Use tools that help you move around (mobility aids) if they are needed. These include: ? Canes. ? Walkers. ? Scooters. ? Crutches.  Turn on the lights when you go into a dark area. Replace any light bulbs as soon as they burn out.  Set up your furniture so you have a clear path.  Avoid moving your furniture around.  If any of your floors are uneven, fix them.  If there are any pets around you, be aware of where they are.  Review your medicines with your doctor. Some medicines can make you feel dizzy. This can increase your chance of falling. Ask your doctor what other things that you can do to help prevent falls. This information is not intended to replace advice given to you by your health care provider. Make sure you discuss any questions you have with your health care provider. Document Released: 01/06/2009 Document Revised: 08/18/2015 Document Reviewed: 04/16/2014 Elsevier Interactive Patient Education  2018 [place] Maintenance  Topic Date Due  . INFLUENZA VACCINE  07/17/2017 (Originally 10/24/2016)  . DEXA SCAN  02/21/2018 (Originally 06/01/2016)  . MAMMOGRAM  06/12/2017  . TETANUS/TDAP  06/25/2023  . COLONOSCOPY  07/12/2024  . PNA vac Low Risk Adult  Addressed   Please do call to schedule your bone density study and mammogram; the number to schedule one at either [facility] or [facility] is [phone] or [phone]  You have received the Prevnar vaccine (PCV-13) and you will not need another booster of this for the rest of your life per current ACIP guidelines  Check out the information at familydoctor.org entitled "Nutrition for Weight Loss: What You Need to Know about Fad Diets" Try to lose between 1-2 pounds per week by taking in fewer calories and burning off more calories You can succeed by limiting portions, limiting foods dense in calories and fat, becoming more active, and drinking 8 glasses of water a day (64 ounces) Don't skip meals, especially breakfast, as skipping meals may alter your metabolism Do not use over-the-counter weight loss pills or gimmicks that claim rapid weight loss A healthy BMI (or body mass index) is between 18.5 and 24.9 You can calculate your ideal BMI at the [place] website  ClubMon[employer].fr  Health Maintenance for Postmenopausal Women Menopause is a normal process in which your reproductive ability comes to an end. This process happens gradually over a span of months to years, usually between the ages of 72 and 66. Menopause is complete when you have missed 12 consecutive menstrual periods. It is important to talk with your health care provider about some of the most common conditions that affect postmenopausal women, such as heart disease, cancer, and bone loss (osteoporosis). Adopting a healthy lifestyle and getting preventive care can  help to promote your health and wellness. Those actions can also lower your chances of developing some of these common conditions. What should I know about menopause? During menopause, you may experience a number of symptoms, such as:  Moderate-to-severe hot flashes.  Night sweats.  Decrease in sex drive.  Mood swings.  Headaches.  Tiredness.  Irritability.  Memory problems.  Insomnia.  Choosing to treat or not to treat menopausal changes is an individual decision that you make with your health care provider. What should I know about hormone replacement therapy and supplements? Hormone therapy products are effective for treating symptoms that are associated with menopause, such as hot flashes and night sweats. Hormone replacement carries certain risks, especially as you become older. If you are thinking about using estrogen or estrogen with progestin treatments, discuss the benefits and risks with your health care provider. What should I know about heart disease and stroke? Heart disease, heart attack, and stroke become more likely as you age. This may be due, in part, to the hormonal changes that your body experiences during menopause. These can affect how your body processes dietary fats, triglycerides, and cholesterol. Heart attack and stroke are both medical  emergencies. There are many things that you can do to help prevent heart disease and stroke:  Have your blood pressure checked at least every 1-2 years. High blood pressure causes heart disease and increases the risk of stroke.  If you are 57-55 years old, ask your health care provider if you should take aspirin to prevent a heart attack or a stroke.  Do not use any tobacco products, including cigarettes, chewing tobacco, or electronic cigarettes. If you need help quitting, ask your health care provider.  It is important to eat a healthy diet and maintain a healthy weight. ? Be sure to include plenty of vegetables, fruits, low-fat dairy products, and lean protein. ? Avoid eating foods that are high in solid fats, added sugars, or salt (sodium).  Get regular exercise. This is one of the most important things that you can do for your health. ? Try to exercise for at least 150 minutes each week. The type of exercise that you do should increase your heart rate and make you sweat. This is known as moderate-intensity exercise. ? Try to do strengthening exercises at least twice each week. Do these in addition to the moderate-intensity exercise.  Know your numbers.Ask your health care provider to check your cholesterol and your blood glucose. Continue to have your blood tested as directed by your health care provider.  What should I know about cancer screening? There are several types of cancer. Take the following steps to reduce your risk and to catch any cancer development as early as possible. Breast Cancer  Practice breast self-awareness. ? This means understanding how your breasts normally appear and feel. ? It also means doing regular breast self-exams. Let your health care provider know about any changes, no matter how small.  If you are 86 or older, have a clinician do a breast exam (clinical breast exam or CBE) every year. Depending on your age, family history, and medical history, it  may be recommended that you also have a yearly breast X-ray (mammogram).  If you have a family history of breast cancer, talk with your health care provider about genetic screening.  If you are at high risk for breast cancer, talk with your health care provider about having an MRI and a mammogram every year.  Breast cancer (BRCA) gene  test is recommended for women who have family members with BRCA-related cancers. Results of the assessment will determine the need for genetic counseling and BRCA1 and for BRCA2 testing. BRCA-related cancers include these types: ? Breast. This occurs in males or females. ? Ovarian. ? Tubal. This may also be called fallopian tube cancer. ? Cancer of the abdominal or pelvic lining (peritoneal cancer). ? Prostate. ? Pancreatic.  Cervical, Uterine, and Ovarian Cancer Your health care provider may recommend that you be screened regularly for cancer of the pelvic organs. These include your ovaries, uterus, and vagina. This screening involves a pelvic exam, which includes checking for microscopic changes to the surface of your cervix (Pap test).  For women ages 21-65, health care providers may recommend a pelvic exam and a Pap test every three years. For women ages 59-65, they may recommend the Pap test and pelvic exam, combined with testing for human papilloma virus (HPV), every five years. Some types of HPV increase your risk of cervical cancer. Testing for HPV may also be done on women of any age who have unclear Pap test results.  Other health care providers may not recommend any screening for nonpregnant women who are considered low risk for pelvic cancer and have no symptoms. Ask your health care provider if a screening pelvic exam is right for you.  If you have had past treatment for cervical cancer or a condition that could lead to cancer, you need Pap tests and screening for cancer for at least 20 years after your treatment. If Pap tests have been discontinued  for you, your risk factors (such as having a new sexual partner) need to be reassessed to determine if you should start having screenings again. Some women have medical problems that increase the chance of getting cervical cancer. In these cases, your health care provider may recommend that you have screening and Pap tests more often.  If you have a family history of uterine cancer or ovarian cancer, talk with your health care provider about genetic screening.  If you have vaginal bleeding after reaching menopause, tell your health care provider.  There are currently no reliable tests available to screen for ovarian cancer.  Lung Cancer Lung cancer screening is recommended for adults 83-69 years old who are at high risk for lung cancer because of a history of smoking. A yearly low-dose CT scan of the lungs is recommended if you:  Currently smoke.  Have a history of at least 30 pack-years of smoking and you currently smoke or have quit within the past 15 years. A pack-year is smoking an average of one pack of cigarettes per day for one year.  Yearly screening should:  Continue until it has been 15 years since you quit.  Stop if you develop a health problem that would prevent you from having lung cancer treatment.  Colorectal Cancer  This type of cancer can be detected and can often be prevented.  Routine colorectal cancer screening usually begins at age 73 and continues through age 78.  If you have risk factors for colon cancer, your health care provider may recommend that you be screened at an earlier age.  If you have a family history of colorectal cancer, talk with your health care provider about genetic screening.  Your health care provider may also recommend using home test kits to check for hidden blood in your stool.  A small camera at the end of a tube can be used to examine your colon directly (sigmoidoscopy  or colonoscopy). This is done to check for the earliest forms of  colorectal cancer.  Direct examination of the colon should be repeated every 5-10 years until age 5. However, if early forms of precancerous polyps or small growths are found or if you have a family history or genetic risk for colorectal cancer, you may need to be screened more often.  Skin Cancer  Check your skin from head to toe regularly.  Monitor any moles. Be sure to tell your health care provider: ? About any new moles or changes in moles, especially if there is a change in a mole's shape or color. ? If you have a mole that is larger than the size of a pencil eraser.  If any of your family members has a history of skin cancer, especially at a young age, talk with your health care provider about genetic screening.  Always use sunscreen. Apply sunscreen liberally and repeatedly throughout the day.  Whenever you are outside, protect yourself by wearing long sleeves, pants, a wide-brimmed hat, and sunglasses.  What should I know about osteoporosis? Osteoporosis is a condition in which bone destruction happens more quickly than new bone creation. After menopause, you may be at an increased risk for osteoporosis. To help prevent osteoporosis or the bone fractures that can happen because of osteoporosis, the following is recommended:  If you are 61-83 years old, get at least 1,000 mg of calcium and at least 600 mg of vitamin D per day.  If you are older than age 83 but younger than age 15, get at least 1,200 mg of calcium and at least 600 mg of vitamin D per day.  If you are older than age 51, get at least 1,200 mg of calcium and at least 800 mg of vitamin D per day.  Smoking and excessive alcohol intake increase the risk of osteoporosis. Eat foods that are rich in calcium and vitamin D, and do weight-bearing exercises several times each week as directed by your health care provider. What should I know about how menopause affects my mental health? Depression may occur at any age, but it  is more common as you become older. Common symptoms of depression include:  Low or sad mood.  Changes in sleep patterns.  Changes in appetite or eating patterns.  Feeling an overall lack of motivation or enjoyment of activities that you previously enjoyed.  Frequent crying spells.  Talk with your health care provider if you think that you are experiencing depression. What should I know about immunizations? It is important that you get and maintain your immunizations. These include:  Tetanus, diphtheria, and pertussis (Tdap) booster vaccine.  Influenza every year before the flu season begins.  Pneumonia vaccine.  Shingles vaccine.  Your health care provider may also recommend other immunizations. This information is not intended to replace advice given to you by your health care provider. Make sure you discuss any questions you have with your health care provider. Document Released: 05/04/2005 Document Revised: 09/30/2015 Document Reviewed: 12/14/2014 Elsevier Interactive Patient Education  2018 [employer].

## 2017-05-21 NOTE — Assessment & Plan Note (Signed)
USPSTF grade A and B recommendations reviewed with patient; age-appropriate recommendations, preventive care, screening tests, etc discussed and encouraged; healthy living encouraged; see AVS for patient education given to patient  

## 2017-05-21 NOTE — Assessment & Plan Note (Signed)
Patient will be working on weight loss

## 2017-05-22 ENCOUNTER — Ambulatory Visit: Payer: Medicare Other | Admitting: Family Medicine

## 2017-06-06 ENCOUNTER — Ambulatory Visit: Payer: Medicare Other | Admitting: Family Medicine

## 2017-06-06 ENCOUNTER — Encounter: Payer: Self-pay | Admitting: Family Medicine

## 2017-06-06 ENCOUNTER — Other Ambulatory Visit: Payer: Self-pay | Admitting: Family Medicine

## 2017-06-06 ENCOUNTER — Other Ambulatory Visit: Payer: Self-pay

## 2017-06-06 VITALS — BP 110/64 | HR 98 | Temp 98.1°F | Resp 16 | Wt 253.2 lb

## 2017-06-06 DIAGNOSIS — R29898 Other symptoms and signs involving the musculoskeletal system: Secondary | ICD-10-CM

## 2017-06-06 DIAGNOSIS — E038 Other specified hypothyroidism: Secondary | ICD-10-CM

## 2017-06-06 DIAGNOSIS — M810 Age-related osteoporosis without current pathological fracture: Secondary | ICD-10-CM | POA: Diagnosis not present

## 2017-06-06 DIAGNOSIS — R7303 Prediabetes: Secondary | ICD-10-CM | POA: Diagnosis not present

## 2017-06-06 DIAGNOSIS — M1A00X0 Idiopathic chronic gout, unspecified site, without tophus (tophi): Secondary | ICD-10-CM

## 2017-06-06 DIAGNOSIS — E041 Nontoxic single thyroid nodule: Secondary | ICD-10-CM | POA: Diagnosis not present

## 2017-06-06 DIAGNOSIS — E039 Hypothyroidism, unspecified: Secondary | ICD-10-CM | POA: Diagnosis not present

## 2017-06-06 DIAGNOSIS — I10 Essential (primary) hypertension: Secondary | ICD-10-CM | POA: Diagnosis not present

## 2017-06-06 DIAGNOSIS — Z5181 Encounter for therapeutic drug level monitoring: Secondary | ICD-10-CM

## 2017-06-06 DIAGNOSIS — R809 Proteinuria, unspecified: Secondary | ICD-10-CM

## 2017-06-06 DIAGNOSIS — E782 Mixed hyperlipidemia: Secondary | ICD-10-CM

## 2017-06-06 NOTE — Assessment & Plan Note (Signed)
Managed by Dr. Loney Loh

## 2017-06-06 NOTE — Assessment & Plan Note (Addendum)
Ever since the bout with sepsis; not back to her previous strength; using walker

## 2017-06-06 NOTE — Assessment & Plan Note (Signed)
Check lipids; avoiding fatty pork

## 2017-06-06 NOTE — Assessment & Plan Note (Signed)
Encouraged weight loss, see AVS 

## 2017-06-06 NOTE — Assessment & Plan Note (Signed)
Check glucose and A1c 

## 2017-06-06 NOTE — Assessment & Plan Note (Signed)
recheck

## 2017-06-06 NOTE — Assessment & Plan Note (Signed)
Check uric acid; continue allopurinol; so glad she knows what foods to avoid

## 2017-06-06 NOTE — Progress Notes (Signed)
Check urine microalb:Cr

## 2017-06-06 NOTE — Assessment & Plan Note (Addendum)
Excellent control; last visit to nephro was 2015; DASH guidelines

## 2017-06-06 NOTE — Assessment & Plan Note (Signed)
Check labs 

## 2017-06-06 NOTE — Assessment & Plan Note (Signed)
She has DEXA scheduled for 06/18/17; adequate calcium and vit D important

## 2017-06-06 NOTE — Progress Notes (Signed)
BP 110/64   Pulse 98   Temp 98.1 F (36.7 C) (Oral)   Resp 16   Wt 253 lb 3.2 oz (114.9 kg)   SpO2 95%   BMI 42.13 kg/m    Subjective:    Patient ID: Tracy Bolton, female    DOB: [birth year], 77 y.o.   MRN: [id number]  HPI: Tracy Bolton is a 77 y.o. female  Chief Complaint  Patient presents with  . Follow-up    labs    HPI Patient is here for f/u  Gout; last episode/flare was awhile ago; very seldom; on the medicine, that's much better; she is aware of what to avoid; she has researched, stays away from her flare foods; knows to avoid certain seafood; no alcohol  Hypertension; trying to stay away from salt; no decongestants; lots of fresh fruits and veggies; frozen is the other alternative; not canned  Goiter and hypothyroidism; managed by Dr. Loney Loh; sees him in April; not growing, stable; he meansures it regularly and does blood work; voice is not very hoarse; no trouble swallowing  Prediabetes; no dry mouth; was started on metformin; she is getting three month supply Lab Results  Component Value Date   HGBA1C 6.0 (H) 11/19/2016   Osteoporosis; going to have DEXA and mammo on 06/18/17 Getting some calcium in food but taking supplement and vit D; not running in the family; problem with that since she had sepsis; she was down and out for 30 days  Uses walker to be extra careful  High cholesterol; limit fatty meats; not eating much pork like she uses to, and avoid beef triggers her gout so avoiding that  Morbid obesity; watching diet, lost 7 pounds over 3-4 months  Depression screen [facility] 2/9 06/06/2017 05/21/2017 02/21/2017 11/19/2016 05/18/2016  Decreased Interest 0 0 0 0 0  Down, Depressed, Hopeless 0 0 0 0 0  PHQ - 2 Score 0 0 0 0 0    Relevant past medical, surgical, family and social history reviewed Past Medical History:  Diagnosis Date  . Arthritis   . Gout, chronic   . Hyperlipidemia   . Hypertension   . Osteoporosis   . Thyroid disease    Past  Surgical History:  Procedure Laterality Date  . LUNG SURGERY Right 2004   biopsy normal   Family History  Problem Relation Age of Onset  . Bone cancer Father 32  . Thyroid disease Sister   . Cancer Mother 73       brain tumor  . Breast cancer Neg Hx    Social History   Tobacco Use  . Smoking status: Never Smoker  . Smokeless tobacco: Never Used  Substance Use Topics  . Alcohol use: No    Alcohol/week: 0.0 oz  . Drug use: No    Interim medical history since last visit reviewed. Allergies and medications reviewed  Review of Systems Per HPI unless specifically indicated above     Objective:    BP 110/64   Pulse 98   Temp 98.1 F (36.7 C) (Oral)   Resp 16   Wt 253 lb 3.2 oz (114.9 kg)   SpO2 95%   BMI 42.13 kg/m   Wt Readings from Last 3 Encounters:  06/06/17 253 lb 3.2 oz (114.9 kg)  05/21/17 254 lb 3.2 oz (115.3 kg)  02/21/17 260 lb 9.6 oz (118.2 kg)    Physical Exam  Constitutional: She appears well-developed and well-nourished. No distress.  Morbidly obese, weight stable  HENT:  Head: Normocephalic and atraumatic.  Eyes: EOM are normal. No scleral icterus.  Neck: Thyromegaly (right side) present.  Cardiovascular: Normal rate, regular rhythm and normal heart sounds.  No murmur heard. Pulmonary/Chest: Effort normal and breath sounds normal. No respiratory distress. She has no wheezes.  Abdominal: Soft. Bowel sounds are normal. She exhibits no distension.  Musculoskeletal: Normal range of motion. She exhibits no edema.       Left ankle: She exhibits swelling (nonpitting edema of left lateral malleolus).  Neurological: She is alert.  Skin: Skin is warm. She is not diaphoretic. No pallor.  Psychiatric: She has a normal mood and affect. Her mood appears not anxious. She does not exhibit a depressed mood.  Pleasant, good eye contact with examiner   Results for orders placed or performed in visit on 11/19/16  Uric acid  Result Value Ref Range   Uric Acid,  Serum 6.5 2.5 - 7.0 mg/dL  COMPLETE METABOLIC PANEL WITH GFR  Result Value Ref Range   Sodium 139 135 - 146 mmol/L   Potassium 4.2 3.5 - 5.3 mmol/L   Chloride 103 98 - 110 mmol/L   CO2 20 20 - 32 mmol/L   Glucose, Bld 172 (H) 65 - 99 mg/dL   BUN 17 7 - 25 mg/dL   Creat 0.98 (H) 0.60 - 0.93 mg/dL   Total Bilirubin 0.8 0.2 - 1.2 mg/dL   Alkaline Phosphatase 44 33 - 130 U/L   AST 28 10 - 35 U/L   ALT 28 6 - 29 U/L   Total Protein 7.3 6.1 - 8.1 g/dL   Albumin 4.1 3.6 - 5.1 g/dL   Calcium 9.4 8.6 - 10.4 mg/dL   GFR, Est African American 66 >=60 mL/min   GFR, Est Non African American 57 (L) >=60 mL/min  Hemoglobin A1c  Result Value Ref Range   Hgb A1c MFr Bld 6.0 (H) <5.7 %   Mean Plasma Glucose 126 mg/dL  Microalbumin / creatinine urine ratio  Result Value Ref Range   Creatinine, Urine 288 20 - 320 mg/dL   Microalb, Ur 9.1 Not estab mg/dL   Microalb Creat Ratio 32 (H) <30 mcg/mg creat      Assessment & Plan:   Problem List Items Addressed This Visit      Cardiovascular and Mediastinum   Hypertension goal BP (blood pressure) < 140/90    Excellent control; last visit to nephro was 2015; DASH guidelines        Endocrine   Subclinical hypothyroidism    Managed by Dr. Loney Loh      Right-sided uninodular goiter    Managed by Dr. Theo Dills and Auditory   Weakness of both lower extremities    Ever since the bout with sepsis; not back to her previous strength; using walker        Musculoskeletal and Integument   OP (osteoporosis)    She has DEXA scheduled for 06/18/17; adequate calcium and vit D important      Relevant Orders   VITAMIN D 25 Hydroxy (Vit-D Deficiency, Fractures)   Chronic gouty arthritis    Check uric acid; continue allopurinol; so glad she knows what foods to avoid      Relevant Orders   Uric acid     Other   Pre-diabetes    Check glucose and A1c      Relevant Orders   Lipid panel   Hemoglobin A1c   Obesity, Class III, 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 Sanda Klein.  An after-visit summary was printed and given to the patient at [facility].  Please see the patient instructions which may contain other information and recommendations beyond what is mentioned above in the assessment and plan.  No orders of the defined types were placed in this encounter.   Orders Placed This Encounter  Procedures  . Uric acid  . Lipid panel  . Hemoglobin A1c  . COMPLETE METABOLIC PANEL WITH GFR  . VITAMIN D 25 Hydroxy (Vit-D Deficiency, Fractures)

## 2017-06-06 NOTE — Patient Instructions (Signed)
Check out the information at [email or web address].org entitled "Nutrition for Weight Loss: What You Need to Know about Fad Diets" Try to lose between 1-2 pounds per week by taking in fewer calories and burning off more calories You can succeed by limiting portions, limiting foods dense in calories and fat, becoming more active, and drinking 8 glasses of water a day (64 ounces) Don't skip meals, especially breakfast, as skipping meals may alter your metabolism Do not use over-the-counter weight loss pills or gimmicks that claim rapid weight loss A healthy BMI (or body mass index) is between 18.5 and 24.9 You can calculate your ideal BMI at the NIH website http://www.nhlbi.nih.gov/health/educational/lose_wt/BMI/bmicalc.htm Try to limit saturated fats in your diet (bologna, hot dogs, barbeque, cheeseburgers, hamburgers, steak, bacon, sausage, cheese, etc.) and get more fresh fruits, vegetables, and whole grains  

## 2017-06-07 LAB — HEMOGLOBIN A1C
Hgb A1c MFr Bld: 5.5 % of total Hgb (ref <5.7)
Mean Plasma Glucose: 111 (calc)
eAG (mmol/L): 6.2 (calc)

## 2017-06-07 LAB — COMPLETE METABOLIC PANEL WITH GFR
AG Ratio: 1.4 (calc) (ref 1.0–2.5)
ALT: 30 U/L — ABNORMAL HIGH (ref 6–29)
AST: 32 U/L (ref 10–35)
Albumin: 4.3 g/dL (ref 3.6–5.1)
Alkaline phosphatase (APISO): 41 U/L (ref 33–130)
BUN/Creatinine Ratio: 14 (calc) (ref 6–22)
BUN: 13 mg/dL (ref 7–25)
CO2: 24 mmol/L (ref 20–32)
Calcium: 9.5 mg/dL (ref 8.6–10.4)
Chloride: 103 mmol/L (ref 98–110)
Creat: 0.94 mg/dL — ABNORMAL HIGH (ref 0.60–0.93)
GFR, Est African American: 69 mL/min/{1.73_m2} (ref >=60)
GFR, Est Non African American: 60 mL/min/{1.73_m2} (ref >=60)
Globulin: 3 g/dL (calc) (ref 1.9–3.7)
Glucose, Bld: 143 mg/dL — ABNORMAL HIGH (ref 65–99)
Potassium: 4 mmol/L (ref 3.5–5.3)
Sodium: 141 mmol/L (ref 135–146)
Total Bilirubin: 0.9 mg/dL (ref 0.2–1.2)
Total Protein: 7.3 g/dL (ref 6.1–8.1)

## 2017-06-07 LAB — URIC ACID: Uric Acid, Serum: 7.8 mg/dL — ABNORMAL HIGH (ref 2.5–7.0)

## 2017-06-07 LAB — VITAMIN D 25 HYDROXY (VIT D DEFICIENCY, FRACTURES): Vit D, 25-Hydroxy: 19 ng/mL — ABNORMAL LOW (ref 30–100)

## 2017-06-07 LAB — LIPID PANEL
Cholesterol: 181 mg/dL (ref <200)
HDL: 44 mg/dL — ABNORMAL LOW (ref >50)
LDL Cholesterol (Calc): 111 mg/dL (calc) — ABNORMAL HIGH
Non-HDL Cholesterol (Calc): 137 mg/dL (calc) — ABNORMAL HIGH (ref <130)
Total CHOL/HDL Ratio: 4.1 (calc) (ref <5.0)
Triglycerides: 149 mg/dL (ref <150)

## 2017-06-08 LAB — MICROALBUMIN / CREATININE URINE RATIO
Creatinine, Urine: 347 mg/dL — ABNORMAL HIGH (ref 20–275)
Microalb Creat Ratio: 16 mcg/mg creat (ref <30)
Microalb, Ur: 5.6 mg/dL

## 2017-06-11 ENCOUNTER — Telehealth: Payer: Self-pay

## 2017-06-11 NOTE — Telephone Encounter (Signed)
-----   Message from Arnetha Courser, MD sent at 06/10/2017  8:45 AM EDT ----- Please let patient know that her urine ratio is okay; however, she is spilling extra creatinine through her kidneys; work on weight loss and reduce total animal protein in her diet, especially red meat and processed meats; thank you

## 2017-06-11 NOTE — Telephone Encounter (Signed)
Called pt informed her of the information below. Pt gave verbal understanding.  

## 2017-06-18 ENCOUNTER — Other Ambulatory Visit: Payer: Self-pay | Admitting: Family Medicine

## 2017-06-18 ENCOUNTER — Ambulatory Visit
Admission: RE | Admit: 2017-06-18 | Discharge: 2017-06-18 | Disposition: A | Discharge status: Home / Self Care | Payer: Medicare Other | Source: Ambulatory Visit | Attending: Family Medicine | Admitting: Family Medicine

## 2017-06-18 DIAGNOSIS — M810 Age-related osteoporosis without current pathological fracture: Secondary | ICD-10-CM | POA: Insufficient documentation

## 2017-06-18 DIAGNOSIS — Z1231 Encounter for screening mammogram for malignant neoplasm of breast: Secondary | ICD-10-CM | POA: Insufficient documentation

## 2017-06-18 DIAGNOSIS — Z13820 Encounter for screening for osteoporosis: Secondary | ICD-10-CM | POA: Diagnosis present

## 2017-06-18 DIAGNOSIS — Z1239 Encounter for other screening for malignant neoplasm of breast: Secondary | ICD-10-CM

## 2017-06-18 MED ORDER — ATORVASTATIN CALCIUM 10 MG PO TABS: 10.0000 mg | ORAL_TABLET | Freq: Every day | ORAL | 1 refills | Status: DC

## 2017-06-18 MED ORDER — VITAMIN D (ERGOCALCIFEROL) 1.25 MG (50000 UNIT) PO CAPS: 50000.0000 [IU] | ORAL_CAPSULE | ORAL | 1 refills | Status: DC

## 2017-06-18 NOTE — Progress Notes (Signed)
Start Rx vit D Start statin

## 2017-07-14 ENCOUNTER — Other Ambulatory Visit: Payer: Self-pay | Admitting: Family Medicine

## 2017-07-15 NOTE — Telephone Encounter (Signed)
Two month supply of vit D Rx sent on 06/18/17; request is too soon Resolve with pharmacy and/or patient She should take the Rx vit D once a week for 8 weeks, already sent in March

## 2017-07-16 ENCOUNTER — Other Ambulatory Visit: Payer: Self-pay | Admitting: Family Medicine

## 2017-07-16 NOTE — Telephone Encounter (Signed)
Rx refill for statin not due until 3rd week of May, another month from now

## 2017-08-15 ENCOUNTER — Other Ambulatory Visit: Payer: Self-pay | Admitting: Family Medicine

## 2017-08-17 ENCOUNTER — Other Ambulatory Visit: Payer: Self-pay | Admitting: Family Medicine

## 2017-10-13 ENCOUNTER — Other Ambulatory Visit: Payer: Self-pay | Admitting: Family Medicine

## 2017-11-13 ENCOUNTER — Other Ambulatory Visit: Payer: Self-pay | Admitting: Family Medicine

## 2017-11-19 ENCOUNTER — Other Ambulatory Visit: Payer: Self-pay | Admitting: Family Medicine

## 2017-12-06 ENCOUNTER — Ambulatory Visit: Payer: Medicare Other | Admitting: Family Medicine

## 2017-12-13 ENCOUNTER — Other Ambulatory Visit: Payer: Self-pay | Admitting: Nurse Practitioner

## 2017-12-13 NOTE — Telephone Encounter (Signed)
Spoke with pt and appt made for this Monday with Dr Sanda Klein

## 2017-12-13 NOTE — Telephone Encounter (Signed)
Pt needs follow up appointment - 15 day supply provided to allow her time to come in.  Needs appt with Benjamine Mola NP-C or Dr. Sanda Klein. Please schedule.

## 2017-12-16 ENCOUNTER — Encounter: Payer: Self-pay | Admitting: Family Medicine

## 2017-12-16 ENCOUNTER — Ambulatory Visit: Payer: Medicare Other | Admitting: Family Medicine

## 2017-12-16 VITALS — BP 126/72 | HR 76 | Temp 98.1°F | Ht 63.0 in | Wt 243.2 lb

## 2017-12-16 DIAGNOSIS — I10 Essential (primary) hypertension: Secondary | ICD-10-CM | POA: Diagnosis not present

## 2017-12-16 DIAGNOSIS — M1A00X0 Idiopathic chronic gout, unspecified site, without tophus (tophi): Secondary | ICD-10-CM | POA: Diagnosis not present

## 2017-12-16 DIAGNOSIS — E782 Mixed hyperlipidemia: Secondary | ICD-10-CM | POA: Diagnosis not present

## 2017-12-16 DIAGNOSIS — Z5181 Encounter for therapeutic drug level monitoring: Secondary | ICD-10-CM

## 2017-12-16 DIAGNOSIS — M1000 Idiopathic gout, unspecified site: Secondary | ICD-10-CM

## 2017-12-16 DIAGNOSIS — R7303 Prediabetes: Secondary | ICD-10-CM | POA: Diagnosis not present

## 2017-12-16 DIAGNOSIS — E041 Nontoxic single thyroid nodule: Secondary | ICD-10-CM

## 2017-12-16 DIAGNOSIS — B372 Candidiasis of skin and nail: Secondary | ICD-10-CM

## 2017-12-16 MED ORDER — METFORMIN HCL ER 500 MG PO TB24: 1000.0000 mg | ORAL_TABLET | Freq: Every day | ORAL | 0 refills | Status: DC

## 2017-12-16 MED ORDER — ATENOLOL 50 MG PO TABS: 50.0000 mg | ORAL_TABLET | Freq: Every day | ORAL | 3 refills | Status: DC

## 2017-12-16 MED ORDER — ALLOPURINOL 100 MG PO TABS: 100.0000 mg | ORAL_TABLET | Freq: Every day | ORAL | 3 refills | Status: DC

## 2017-12-16 MED ORDER — AMLODIPINE BESYLATE 10 MG PO TABS: 10.0000 mg | ORAL_TABLET | Freq: Every day | ORAL | 3 refills | Status: DC

## 2017-12-16 MED ORDER — NYSTATIN 100000 UNIT/GM EX POWD: Freq: Three times a day (TID) | CUTANEOUS | 5 refills | Status: DC

## 2017-12-16 NOTE — Assessment & Plan Note (Signed)
Chronic issue, well-controlled; no problems with medicine

## 2017-12-16 NOTE — Assessment & Plan Note (Signed)
Encouraged weight loss; importance of losing to help prevent DM reinforced; politely declined nutrition referral right now but she is welcome to call back if referral desired

## 2017-12-16 NOTE — Patient Instructions (Signed)
Check out the information at [email or web address].org entitled "Nutrition for Weight Loss: What You Need to Know about Fad Diets" Try to lose between 1-2 pounds per week by taking in fewer calories and burning off more calories You can succeed by limiting portions, limiting foods dense in calories and fat, becoming more active, and drinking 8 glasses of water a day (64 ounces) Don't skip meals, especially breakfast, as skipping meals may alter your metabolism Do not use over-the-counter weight loss pills or gimmicks that claim rapid weight loss A healthy BMI (or body mass index) is between 18.5 and 24.9 You can calculate your ideal BMI at the NIH website [employer]Mon[employer].fr Try to limit saturated fats in your diet (bologna, hot dogs, barbeque, cheeseburgers, hamburgers, steak, bacon, sausage, cheese, etc.) and get more fresh fruits, vegetables, and whole grains Try to follow the DASH guidelines (DASH stands for Dietary Approaches to Stop Hypertension). Try to limit the sodium in your diet to no more than 1,500mg  of sodium per day. Certainly try to not exceed 2,000 mg per day at the very most. Do not add salt when cooking or at the table.  Check the sodium amount on labels when shopping, and choose items lower in sodium when given a choice. Avoid or limit foods that already contain a lot of sodium. Eat a diet rich in fruits and vegetables and whole grains, and try to lose weight if overweight or obese

## 2017-12-16 NOTE — Progress Notes (Signed)
BP 126/72   Pulse 76   Temp 98.1 F (36.7 C) (Oral)   Ht 5\' 3"  (1.6 m)   Wt 243 lb 3.2 oz (110.3 kg)   SpO2 99%   BMI 43.08 kg/m    Subjective:    Patient ID: Tracy Bolton, female    DOB: May 16, 1940, 77 y.o.   MRN: [id number]  HPI: Tracy Bolton is a 77 y.o. female  Chief Complaint  Patient presents with  . Follow-up    HPI Patient is here for f/u  No medical excitement since last visit  Vitamin D low; was 19 and she was taking OTC and now will start once a month vit D They called her at the end of August for low vitamin D; she was taking vitamin D and calcium over-the-counter Okay to take plain calcium; drink cow's milk; likes greens  HTN; well-controlled, on meds; checking BP at home; fluctuates  Hyperlipidemia; tries to avoid fatty meats; she is on medicine now; eating oatmeal Lab Results  Component Value Date   CHOL 181 06/06/2017   HDL 44 (L) 06/06/2017   LDLCALC 111 (H) 06/06/2017   TRIG 149 06/06/2017   CHOLHDL 4.1 06/06/2017    Morbid obesity; lost 10 pounds over the last 6 months; it has been intentional; eating more boiled and baked foods; less fried foods; plenty of vegetables; drinks plenty of water; wants to lose it right; no medicine desired  Gout; last flare was a long time ago; taking medicine; avoiding organ meats, red meats, seafood  Prediabetes Lab Results  Component Value Date   HGBA1C 5.5 06/06/2017   Goiter; managed by Dr. Lyanne Co, saw him in April   Depression screen [facility] 2/9 12/16/2017 06/06/2017 05/21/2017 02/21/2017 11/19/2016  Decreased Interest 0 0 0 0 0  Down, Depressed, Hopeless 0 0 0 0 0  PHQ - 2 Score 0 0 0 0 0  Altered sleeping 0 - - - -  Tired, decreased energy 0 - - - -  Change in appetite 0 - - - -  Feeling bad or failure about yourself  0 - - - -  Trouble concentrating 0 - - - -  Moving slowly or fidgety/restless 0 - - - -  Suicidal thoughts 0 - - - -  PHQ-9 Score 0 - - - -    Relevant past medical, surgical,  family and social history reviewed Past Medical History:  Diagnosis Date  . Arthritis   . Gout, chronic   . Hyperlipidemia   . Hypertension   . Osteoporosis   . Thyroid disease    Past Surgical History:  Procedure Laterality Date  . LUNG SURGERY Right 2004   biopsy normal   Family History  Problem Relation Age of Onset  . Bone cancer Father 31  . Thyroid disease Sister   . Cancer Mother 33       brain tumor  . Breast cancer Neg Hx    Social History   Tobacco Use  . Smoking status: Never Smoker  . Smokeless tobacco: Never Used  Substance Use Topics  . Alcohol use: No    Alcohol/week: 0.0 standard drinks  . Drug use: No    Interim medical history since last visit reviewed. Allergies and medications reviewed  Review of Systems  Constitutional: Negative for unexpected weight change (patient is working hard for her weight loss).  Cardiovascular: Positive for leg swelling (around ankles).   Per HPI unless specifically indicated above  Objective:    BP 126/72   Pulse 76   Temp 98.1 F (36.7 C) (Oral)   Ht 5\' 3"  (1.6 m)   Wt 243 lb 3.2 oz (110.3 kg)   SpO2 99%   BMI 43.08 kg/m   Wt Readings from Last 3 Encounters:  12/16/17 243 lb 3.2 oz (110.3 kg)  06/06/17 253 lb 3.2 oz (114.9 kg)  05/21/17 254 lb 3.2 oz (115.3 kg)    Physical Exam  Constitutional: She appears well-developed and well-nourished. No distress.  HENT:  Head: Normocephalic and atraumatic.  Eyes: EOM are normal. No scleral icterus.  Neck: Thyroid mass (goiter) present.  Cardiovascular: Normal rate, regular rhythm and normal heart sounds.  No murmur heard. Pulmonary/Chest: Effort normal and breath sounds normal. No respiratory distress. She has no wheezes.  Abdominal: Soft. Bowel sounds are normal. She exhibits no distension.  Musculoskeletal: She exhibits edema (mild nonpitting edema around ankle).  Neurological: She is alert.  Skin: Skin is warm and dry. She is not diaphoretic. No  pallor.  Psychiatric: She has a normal mood and affect. Her behavior is normal. Judgment and thought content normal.    Results for orders placed or performed in visit on 06/06/17  Microalbumin / creatinine urine ratio  Result Value Ref Range   Creatinine, Urine 347 (H) 20 - 275 mg/dL   Microalb, Ur 5.6 mg/dL   Microalb Creat Ratio 16 <30 mcg/mg creat      Assessment & Plan:   Problem List Items Addressed This Visit      Cardiovascular and Mediastinum   Hypertension goal BP (blood pressure) < 140/90 - Primary (Chronic)    Chronic issue, well-controlled; no problems with medicine      Relevant Medications   amLODipine (NORVASC) 10 MG tablet   atenolol (TENORMIN) 50 MG tablet     Endocrine   Right-sided uninodular goiter    Managed by specialist      Relevant Medications   atenolol (TENORMIN) 50 MG tablet     Musculoskeletal and Integument   Chronic gouty arthritis    Avoiding foods; taking medicine      Relevant Medications   allopurinol (ZYLOPRIM) 100 MG tablet   Other Relevant Orders   Uric acid     Other   Pre-diabetes    Check A1c today; weight loss encouraged      Relevant Orders   Hemoglobin A1c   Obesity, Class III, BMI 40-49.9 (morbid obesity) ([facility])    Encouraged weight loss; importance of losing to help prevent DM reinforced; politely declined nutrition referral right now but she is welcome to call back if referral desired      Relevant Medications   metFORMIN (GLUCOPHAGE-XR) 500 MG 24 hr tablet   Hyperlipidemia    Explained that our targets in the medical community have changed; recheck lipids today; limit eggs, cheese, milk, saturated fats      Relevant Medications   amLODipine (NORVASC) 10 MG tablet   atenolol (TENORMIN) 50 MG tablet   Other Relevant Orders   Lipid panel    Other Visit Diagnoses    Medication monitoring encounter       Relevant Orders   COMPLETE METABOLIC PANEL WITH GFR   Idiopathic gout, unspecified chronicity,  unspecified site       Relevant Medications   allopurinol (ZYLOPRIM) 100 MG tablet   Skin yeast infection       Relevant Medications   nystatin (NYAMYC) powder       Follow up  plan: Return in about 6 months (around 06/16/2018) for twenty minute follow-up with fasting labs; Wellness when due.  An after-visit summary was printed and given to the patient at [facility].  Please see the patient instructions which may contain other information and recommendations beyond what is mentioned above in the assessment and plan.  Meds ordered this encounter  Medications  . allopurinol (ZYLOPRIM) 100 MG tablet    Sig: Take 1 tablet (100 mg total) by mouth daily.    Dispense:  90 tablet    Refill:  3  . amLODipine (NORVASC) 10 MG tablet    Sig: Take 1 tablet (10 mg total) by mouth daily.    Dispense:  90 tablet    Refill:  3  . atenolol (TENORMIN) 50 MG tablet    Sig: Take 1 tablet (50 mg total) by mouth daily.    Dispense:  90 tablet    Refill:  3  . metFORMIN (GLUCOPHAGE-XR) 500 MG 24 hr tablet    Sig: Take 2 tablets (1,000 mg total) by mouth daily.    Dispense:  180 tablet    Refill:  0  . nystatin (NYAMYC) powder    Sig: Apply topically 3 (three) times daily. Use sufficient amount TID on affected areas of skin as needed    Dispense:  30 g    Refill:  5    Orders Placed This Encounter  Procedures  . Lipid panel  . Hemoglobin A1c  . COMPLETE METABOLIC PANEL WITH GFR  . Uric acid

## 2017-12-16 NOTE — Assessment & Plan Note (Signed)
Managed by specialist 

## 2017-12-16 NOTE — Assessment & Plan Note (Signed)
Check A1c today; weight loss encouraged

## 2017-12-16 NOTE — Assessment & Plan Note (Signed)
Explained that our targets in the medical community have changed; recheck lipids today; limit eggs, cheese, milk, saturated fats

## 2017-12-16 NOTE — Assessment & Plan Note (Signed)
Avoiding foods; taking medicine

## 2017-12-17 LAB — COMPLETE METABOLIC PANEL WITH GFR
AG Ratio: 1.4 (calc) (ref 1.0–2.5)
ALT: 23 U/L (ref 6–29)
AST: 27 U/L (ref 10–35)
Albumin: 4.2 g/dL (ref 3.6–5.1)
Alkaline phosphatase (APISO): 43 U/L (ref 33–130)
BUN/Creatinine Ratio: 16 (calc) (ref 6–22)
BUN: 15 mg/dL (ref 7–25)
CO2: 27 mmol/L (ref 20–32)
Calcium: 10 mg/dL (ref 8.6–10.4)
Chloride: 103 mmol/L (ref 98–110)
Creat: 0.96 mg/dL — ABNORMAL HIGH (ref 0.60–0.93)
GFR, Est African American: 68 mL/min/{1.73_m2} (ref >=60)
GFR, Est Non African American: 58 mL/min/{1.73_m2} — ABNORMAL LOW (ref >=60)
Globulin: 3 g/dL (calc) (ref 1.9–3.7)
Glucose, Bld: 122 mg/dL — ABNORMAL HIGH (ref 65–99)
Potassium: 3.7 mmol/L (ref 3.5–5.3)
Sodium: 140 mmol/L (ref 135–146)
Total Bilirubin: 1 mg/dL (ref 0.2–1.2)
Total Protein: 7.2 g/dL (ref 6.1–8.1)

## 2017-12-17 LAB — LIPID PANEL
Cholesterol: 112 mg/dL (ref <200)
HDL: 49 mg/dL — ABNORMAL LOW (ref >50)
LDL Cholesterol (Calc): 44 mg/dL (calc)
Non-HDL Cholesterol (Calc): 63 mg/dL (calc) (ref <130)
Total CHOL/HDL Ratio: 2.3 (calc) (ref <5.0)
Triglycerides: 108 mg/dL (ref <150)

## 2017-12-17 LAB — HEMOGLOBIN A1C
Hgb A1c MFr Bld: 5.6 % of total Hgb (ref <5.7)
Mean Plasma Glucose: 114 (calc)
eAG (mmol/L): 6.3 (calc)

## 2017-12-17 LAB — URIC ACID: Uric Acid, Serum: 7.4 mg/dL — ABNORMAL HIGH (ref 2.5–7.0)

## 2018-01-27 ENCOUNTER — Encounter: Payer: Self-pay | Admitting: Family Medicine

## 2018-02-12 ENCOUNTER — Other Ambulatory Visit: Payer: Self-pay | Admitting: Family Medicine

## 2018-02-12 NOTE — Telephone Encounter (Signed)
Last sgpt and lipds reviewed; Rx approved

## 2018-02-16 ENCOUNTER — Telehealth: Payer: Self-pay | Admitting: Family Medicine

## 2018-02-23 HISTORY — PX: CATARACT EXTRACTION, BILATERAL: SHX1313

## 2018-02-28 MED ORDER — VITAMIN D (ERGOCALCIFEROL) 1.25 MG (50000 UNIT) PO CAPS: 50000.0000 [IU] | ORAL_CAPSULE | ORAL | 2 refills | Status: DC

## 2018-02-28 NOTE — Telephone Encounter (Signed)
I think that will be fine Rx sent

## 2018-02-28 NOTE — Telephone Encounter (Signed)
Pt following up on request for  Vitamin D, Ergocalciferol, (DRISDOL) 50000 units CAPS capsule  Pt thought she was to continue monthly. Please advise [id number] - [place], [place] - [address] [phone] (Phone) [phone] (Fax)

## 2018-02-28 NOTE — Addendum Note (Signed)
Addended by: LADA, Satira Anis on: 02/28/2018 05:25 PM   Modules accepted: Orders

## 2018-03-30 ENCOUNTER — Other Ambulatory Visit: Payer: Self-pay | Admitting: Family Medicine

## 2018-04-13 ENCOUNTER — Other Ambulatory Visit: Payer: Self-pay | Admitting: Family Medicine

## 2018-04-14 NOTE — Telephone Encounter (Signed)
Lab Results  Component Value Date   CREATININE 0.96 (H) 12/16/2017

## 2018-05-26 ENCOUNTER — Ambulatory Visit: Payer: Medicare Other | Admitting: Family Medicine

## 2018-05-27 ENCOUNTER — Other Ambulatory Visit: Payer: Self-pay | Admitting: Family Medicine

## 2018-05-27 NOTE — Telephone Encounter (Signed)
Patient has appt later this month Will check her vit D level then Until then, she can take 1,000 iu OTC vitamin D3 once a day

## 2018-05-28 NOTE — Telephone Encounter (Signed)
Pt.notified

## 2018-05-29 ENCOUNTER — Other Ambulatory Visit: Payer: Self-pay | Admitting: Family Medicine

## 2018-05-29 DIAGNOSIS — Z1231 Encounter for screening mammogram for malignant neoplasm of breast: Secondary | ICD-10-CM

## 2018-06-04 ENCOUNTER — Telehealth: Payer: Self-pay

## 2018-06-04 NOTE — Telephone Encounter (Signed)
Left message for patient that AWV scheduled for 06/05/18 needs to be rescheduled due to [employer] out of office at 11:00. Requested patient call the office. Please reschedule if patient calls back. Thank you.

## 2018-06-05 ENCOUNTER — Ambulatory Visit: Payer: Medicare Other

## 2018-06-10 ENCOUNTER — Ambulatory Visit (INDEPENDENT_AMBULATORY_CARE_PROVIDER_SITE_OTHER): Payer: Medicare Other

## 2018-06-10 ENCOUNTER — Other Ambulatory Visit: Payer: Self-pay

## 2018-06-10 VITALS — BP 112/66 | HR 68 | Temp 98.1°F | Resp 16 | Ht 63.0 in | Wt 233.0 lb

## 2018-06-10 DIAGNOSIS — Z0000 Encounter for general adult medical examination without abnormal findings: Secondary | ICD-10-CM

## 2018-06-10 NOTE — Progress Notes (Signed)
Subjective:   Tracy Bolton is a 78 y.o. female who presents for Medicare Annual (Subsequent) preventive examination.  Review of Systems:   Cardiac Risk Factors include: advanced age (>3men, >76 women);diabetes mellitus;dyslipidemia;hypertension;obesity (BMI >30kg/m2)     Objective:     Vitals: BP 112/66 (BP Location: Left Arm, Patient Position: Sitting, Cuff Size: Large)   Pulse 68   Temp 98.1 F (36.7 C) (Oral)   Resp 16   Ht 5\' 3"  (1.6 m)   Wt 233 lb (105.7 kg)   SpO2 96%   BMI 41.27 kg/m   Body mass index is 41.27 kg/m.  Advanced Directives 06/10/2018 11/19/2016 05/18/2016 05/18/2016 11/14/2015 05/18/2015 09/06/2014  Does Patient Have a Medical Advance Directive? Yes Yes Yes No Yes Yes No  Type of Paramedic of [place];Living will - [place];Living will - Living will Living will -  Copy of [place] in Chart? No - copy requested - - - No - copy requested No - copy requested -  Would patient like information on creating a medical advance directive? - - - - - - No - patient declined information    Tobacco Social History   Tobacco Use  Smoking Status Never Smoker  Smokeless Tobacco Never Used     Counseling given: Not Answered   Clinical Intake:  Pre-visit preparation completed: Yes  Pain : No/denies pain     BMI - recorded: 41.27 Nutritional Status: BMI > 30  Obese Nutritional Risks: None Diabetes: Yes CBG done?: No Did pt. bring in CBG monitor from home?: No  How often do you need to have someone help you when you read instructions, pamphlets, or other written materials from your doctor or pharmacy?: 1 - Never  Interpreter Needed?: No  Information entered by :: Clemetine Marker LPN  Past Medical History:  Diagnosis Date  . Arthritis   . Gout, chronic   . Hyperlipidemia   . Hypertension   . Osteoporosis   . Thyroid disease    Past Surgical History:  Procedure Laterality Date  . CATARACT  EXTRACTION, BILATERAL  02/2018  . COLONOSCOPY  2016  . LUNG SURGERY Right 2004   biopsy normal   Family History  Problem Relation Age of Onset  . Bone cancer Father 73  . Thyroid disease Sister   . Cancer Mother 32       brain tumor  . Breast cancer Neg Hx    Social History   Socioeconomic History  . Marital status: Single    Spouse name: Not on file  . Number of children: 2  . Years of education: Not on file  . Highest education level: Some college, no degree  Occupational History  . Occupation: retired  Scientific laboratory technician  . Financial resource strain: Not hard at all  . Food insecurity:    Worry: Never true    Inability: Never true  . Transportation needs:    Medical: No    Non-medical: No  Tobacco Use  . Smoking status: Never Smoker  . Smokeless tobacco: Never Used  Substance and Sexual Activity  . Alcohol use: No    Alcohol/week: 0.0 standard drinks  . Drug use: No  . Sexual activity: Not Currently  Lifestyle  . Physical activity:    Days per week: 7 days    Minutes per session: 20 min  . Stress: Not at all  Relationships  . Social connections:    Talks on phone: More than  three times a week    Gets together: More than three times a week    Attends religious service: More than 4 times per year    Active member of club or organization: No    Attends meetings of clubs or organizations: Never    Relationship status: Divorced  Other Topics Concern  . Not on file  Social History Narrative  . Not on file    Outpatient Encounter Medications as of 06/10/2018  Medication Sig  . alendronate (FOSAMAX) 70 MG tablet TAKE 1 TABLET BY MOUTH ONCE A WEEK TAKE WITH FULL GLASS OF WATER ON EMPTY STOMACH  . allopurinol (ZYLOPRIM) 100 MG tablet Take 1 tablet (100 mg total) by mouth daily.  Marland Kitchen amLODipine (NORVASC) 10 MG tablet Take 1 tablet (10 mg total) by mouth daily.  Marland Kitchen aspirin 81 MG tablet Take 81 mg by mouth.  Marland Kitchen atenolol (TENORMIN) 50 MG tablet Take 1 tablet (50 mg total) by  mouth daily.  Marland Kitchen atorvastatin (LIPITOR) 10 MG tablet TAKE 1 TABLET BY MOUTH EVERYDAY AT BEDTIME  . [facility] SENNA 8.6 MG tablet TAKE 1 TABLET (8.6 MG TOTAL) BY MOUTH 2 (TWO) TIMES DAILY AS NEEDED FOR CONSTIPATION.  . metFORMIN (GLUCOPHAGE-XR) 500 MG 24 hr tablet TAKE 2 TABLETS BY MOUTH EVERY DAY  . Multiple Vitamin (MULTI-VITAMINS) TABS Take by mouth.  . nystatin [facility]) powder Apply topically 3 (three) times daily. Use sufficient amount TID on affected areas of skin as needed  . Vitamin D, Ergocalciferol, (DRISDOL) 1.25 MG (50000 UT) CAPS capsule Take 1 capsule (50,000 Units total) by mouth every 30 (thirty) days. (Patient not taking: Reported on 06/10/2018)   No facility-administered encounter medications on file as of 06/10/2018.     Activities of Daily Living In your present state of health, do you have any difficulty performing the following activities: 06/10/2018 12/16/2017  Hearing? N N  Comment declines hearing aids -  Vision? N N  Comment reading glasses -  Difficulty concentrating or making decisions? N Y  Walking or climbing stairs? N N  Dressing or bathing? N N  Doing errands, shopping? N N  Preparing Food and eating ? N -  Using the Toilet? N -  In the past six months, have you accidently leaked urine? N -  Do you have problems with loss of bowel control? N -  Managing your Medications? N -  Managing your Finances? N -  Housekeeping or managing your Housekeeping? N -  Some recent data might be hidden    Patient Care Team: Lada, Satira Anis, MD as PCP - General (Family Medicine) Clemmons, Michaell Cowing, MD as Referring Physician (Endocrinology)    Assessment:   This is a routine wellness examination for Tracy Bolton.  Exercise Activities and Dietary recommendations Current Exercise Habits: Home exercise routine, Type of exercise: stretching;calisthenics, Time (Minutes): 20, Frequency (Times/Week): 7, Weekly Exercise (Minutes/Week): 140, Intensity: Mild, Exercise limited by:  orthopedic condition(s)  Goals    . Weight (lb) < 230 lb (104.3 kg) (pt-stated)       Fall Risk Fall Risk  06/10/2018 12/16/2017 06/06/2017 05/21/2017 02/21/2017  Falls in the past year? 0 No No No No  Comment - - - - -  Number falls in past yr: 0 - - - -  Comment - - - - -  Injury with Fall? 0 - - - -  Risk for fall due to : - - - - -  Follow up Falls prevention discussed - - - -  FALL RISK PREVENTION PERTAINING TO THE HOME:  Any stairs in or around the home? No  If so, do they handrails? No   Home free of loose throw rugs in walkways, pet beds, electrical cords, etc? Yes  Adequate lighting in your home to reduce risk of falls? Yes   ASSISTIVE DEVICES UTILIZED TO PREVENT FALLS:  Life alert? Yes  Use of a cane, walker or w/c? Yes  Grab bars in the bathroom? No  Shower chair or bench in shower? Yes  Elevated toilet seat or a handicapped toilet? Yes   DME ORDERS:  DME order needed?  No   TIMED UP AND GO:  Was the test performed? Yes .  Length of time to ambulate 10 feet: 7 sec.   GAIT:  Appearance of gait: Gait stead-fast and with the use of an assistive device.    Education: Fall risk prevention has been discussed.  Intervention(s) required? No   Depression Screen PHQ 2/9 Scores 06/10/2018 12/16/2017 06/06/2017 05/21/2017  PHQ - 2 Score 0 0 0 0  PHQ- 9 Score 0 0 - -     Cognitive Function     6CIT Screen 06/10/2018 05/21/2017  What Year? 0 points 0 points  What month? 0 points 0 points  What time? 0 points 0 points  Count back from 20 0 points 0 points  Months in reverse 0 points 0 points  Repeat phrase 0 points 0 points  Total Score 0 0    Immunization History  Administered Date(s) Administered  . Pneumococcal Conjugate-13 05/21/2017  . Pneumococcal Polysaccharide-23 12/31/2013, 05/18/2015    Qualifies for Shingles Vaccine? Yes . Due for Shingrix. Education has been provided regarding the importance of this vaccine. Pt has been advised to call  insurance company to determine out of pocket expense. Advised may also receive vaccine at local pharmacy or Health Dept. Verbalized acceptance and understanding.  Tdap: Although this vaccine is not a covered service during a Wellness Exam, does the patient still wish to receive this vaccine today?  No .  Education has been provided regarding the importance of this vaccine. Advised may receive this vaccine at local pharmacy or Health Dept. Aware to provide a copy of the vaccination record if obtained from local pharmacy or Health Dept. Verbalized acceptance and understanding.  Flu Vaccine: Due for Flu vaccine. Does the patient want to receive this vaccine today?  No . Education has been provided regarding the importance of this vaccine but still declined. Advised may receive this vaccine at local pharmacy or Health Dept. Aware to provide a copy of the vaccination record if obtained from local pharmacy or Health Dept. Verbalized acceptance and understanding.  Pneumococcal Vaccine: Up to date   Screening Tests Health Maintenance  Topic Date Due  . INFLUENZA VACCINE  10/24/2017  . MAMMOGRAM  06/19/2018  . DEXA SCAN  06/19/2019  . TETANUS/TDAP  06/25/2023  . COLONOSCOPY  07/12/2024  . PNA vac Low Risk Adult  Addressed    Cancer Screenings:  Colorectal Screening: Completed 07/13/14. Repeat every 10 years.  Mammogram: Completed 06/18/17. Repeat every year; Scheduled for 06/23/18.  Bone Density: Completed 06/18/17. Results reflect OSTEOPOROSIS. Repeat every 2 years.   Lung Cancer Screening: (Low Dose CT Chest recommended if Age 66-80 years, 30 pack-year currently smoking OR have quit w/in 15years.) does not qualify.   Additional Screening:  Hepatitis C Screening:no longer required  Vision Screening: Recommended annual ophthalmology exams for early detection of glaucoma and other disorders of the eye.  Is the patient up to date with their annual eye exam?  Yes  Who is the provider or what is  the name of the office in which the pt attends annual eye exams? [place] Screening: Recommended annual dental exams for proper oral hygiene  Community Resource Referral:  CRR required this visit?  No       Plan:      I have personally reviewed and addressed the Medicare Annual Wellness questionnaire and have noted the following in the patient's chart:  A. Medical and social history B. Use of alcohol, tobacco or illicit drugs  C. Current medications and supplements D. Functional ability and status E.  Nutritional status F.  Physical activity G. Advance directives H. List of other physicians I.  Hospitalizations, surgeries, and ER visits in previous 12 months J.  [place] such as hearing and vision if needed, cognitive and depression L. Referrals and appointments   In addition, I have reviewed and discussed with patient certain preventive protocols, quality metrics, and best practice recommendations. A written personalized care plan for preventive services as well as general preventive health recommendations were provided to patient.   Signed,  Clemetine Marker, LPN Nurse Health Advisor   Nurse Notes: pt doing well and appreciative of visit today

## 2018-06-10 NOTE — Patient Instructions (Addendum)
Tracy Bolton , Thank you for taking time to come for your Medicare Wellness Visit. I appreciate your ongoing commitment to your health goals. Please review the following plan we discussed and let me know if I can assist you in the future.   Screening recommendations/referrals: Colonoscopy: done 07/13/14. Repeat in 2026. Mammogram: done 06/18/17. Scheduled for 06/23/18. Bone Density: done 06/18/17 Recommended yearly ophthalmology/optometry visit for glaucoma screening and checkup Recommended yearly dental visit for hygiene and checkup  Vaccinations: Influenza vaccine: postponed Pneumococcal vaccine: done 05/21/17 Tdap vaccine: due - please contact us if you get a cut or scrape Shingles vaccine: Shingrix discussed. Please contact your pharmacy for coverage information.   Advanced directives: Please bring a copy of your health care power of attorney and living will to the office at your convenience.  Conditions/risks identified: Recommend continuing healthy eating and physical activity for desired weight loss.   Next appointment: 06/16/18  10:20 Dr. Sanda Klein    Preventive Care 54 Years and Older, Female Preventive care refers to lifestyle choices and visits with your health care provider that can promote health and wellness. What does preventive care include?  A yearly physical exam. This is also called an annual well check.  Dental exams once or twice a year.  Routine eye exams. Ask your health care provider how often you should have your eyes checked.  Personal lifestyle choices, including:  Daily care of your teeth and gums.  Regular physical activity.  Eating a healthy diet.  Avoiding tobacco and drug use.  Limiting alcohol use.  Practicing safe sex.  Taking low-dose aspirin every day.  Taking vitamin and mineral supplements as recommended by your health care provider. What happens during an annual well check? The services and screenings done by your health care provider  during your annual well check will depend on your age, overall health, lifestyle risk factors, and family history of disease. Counseling  Your health care provider may ask you questions about your:  Alcohol use.  Tobacco use.  Drug use.  Emotional well-being.  Home and relationship well-being.  Sexual activity.  Eating habits.  History of falls.  Memory and ability to understand (cognition).  Work and work Statistician.  Reproductive health. Screening  You may have the following tests or measurements:  Height, weight, and BMI.  Blood pressure.  Lipid and cholesterol levels. These may be checked every 5 years, or more frequently if you are over 2 years old.  Skin check.  Lung cancer screening. You may have this screening every year starting at age 70 if you have a 30-pack-year history of smoking and currently smoke or have quit within the past 15 years.  Fecal occult blood test (FOBT) of the stool. You may have this test every year starting at age 41.  Flexible sigmoidoscopy or colonoscopy. You may have a sigmoidoscopy every 5 years or a colonoscopy every 10 years starting at age 33.  Hepatitis C blood test.  Hepatitis B blood test.  Sexually transmitted disease (STD) testing.  Diabetes screening. This is done by checking your blood sugar (glucose) after you have not eaten for a while (fasting). You may have this done every 1-3 years.  Bone density scan. This is done to screen for osteoporosis. You may have this done starting at age 45.  Mammogram. This may be done every 1-2 years. Talk to your health care provider about how often you should have regular mammograms. Talk with your health care provider about your test results, treatment options,  and if necessary, the need for more tests. Vaccines  Your health care provider may recommend certain vaccines, such as:  Influenza vaccine. This is recommended every year.  Tetanus, diphtheria, and acellular pertussis  (Tdap, Td) vaccine. You may need a Td booster every 10 years.  Zoster vaccine. You may need this after age 51.  Pneumococcal 13-valent conjugate (PCV13) vaccine. One dose is recommended after age 3.  Pneumococcal polysaccharide (PPSV23) vaccine. One dose is recommended after age 39. Talk to your health care provider about which screenings and vaccines you need and how often you need them. This information is not intended to replace advice given to you by your health care provider. Make sure you discuss any questions you have with your health care provider. Document Released: 04/08/2015 Document Revised: 11/30/2015 Document Reviewed: 01/11/2015 Elsevier Interactive Patient Education  2017 [place] Prevention in the Home Falls can cause injuries. They can happen to people of all ages. There are many things you can do to make your home safe and to help prevent falls. What can I do on the outside of my home?  Regularly fix the edges of walkways and driveways and fix any cracks.  Remove anything that might make you trip as you walk through a door, such as a raised step or threshold.  Trim any bushes or trees on the path to your home.  Use bright outdoor lighting.  Clear any walking paths of anything that might make someone trip, such as rocks or tools.  Regularly check to see if handrails are loose or broken. Make sure that both sides of any steps have handrails.  Any raised decks and porches should have guardrails on the edges.  Have any leaves, snow, or ice cleared regularly.  Use sand or salt on walking paths during winter.  Clean up any spills in your garage right away. This includes oil or grease spills. What can I do in the bathroom?  Use night lights.  Install grab bars by the toilet and in the tub and shower. Do not use towel bars as grab bars.  Use non-skid mats or decals in the tub or shower.  If you need to sit down in the shower, use a plastic, non-slip  stool.  Keep the floor dry. Clean up any water that spills on the floor as soon as it happens.  Remove soap buildup in the tub or shower regularly.  Attach bath mats securely with double-sided non-slip rug tape.  Do not have throw rugs and other things on the floor that can make you trip. What can I do in the bedroom?  Use night lights.  Make sure that you have a light by your bed that is easy to reach.  Do not use any sheets or blankets that are too big for your bed. They should not hang down onto the floor.  Have a firm chair that has side arms. You can use this for support while you get dressed.  Do not have throw rugs and other things on the floor that can make you trip. What can I do in the kitchen?  Clean up any spills right away.  Avoid walking on wet floors.  Keep items that you use a lot in easy-to-reach places.  If you need to reach something above you, use a strong step stool that has a grab bar.  Keep electrical cords out of the way.  Do not use floor polish or wax that makes floors slippery. If  you must use wax, use non-skid floor wax.  Do not have throw rugs and other things on the floor that can make you trip. What can I do with my stairs?  Do not leave any items on the stairs.  Make sure that there are handrails on both sides of the stairs and use them. Fix handrails that are broken or loose. Make sure that handrails are as long as the stairways.  Check any carpeting to make sure that it is firmly attached to the stairs. Fix any carpet that is loose or worn.  Avoid having throw rugs at the top or bottom of the stairs. If you do have throw rugs, attach them to the floor with carpet tape.  Make sure that you have a light switch at the top of the stairs and the bottom of the stairs. If you do not have them, ask someone to add them for you. What else can I do to help prevent falls?  Wear shoes that:  Do not have high heels.  Have rubber bottoms.  Are  comfortable and fit you well.  Are closed at the toe. Do not wear sandals.  If you use a stepladder:  Make sure that it is fully opened. Do not climb a closed stepladder.  Make sure that both sides of the stepladder are locked into place.  Ask someone to hold it for you, if possible.  Clearly mark and make sure that you can see:  Any grab bars or handrails.  First and last steps.  Where the edge of each step is.  Use tools that help you move around (mobility aids) if they are needed. These include:  Canes.  Walkers.  Scooters.  Crutches.  Turn on the lights when you go into a dark area. Replace any light bulbs as soon as they burn out.  Set up your furniture so you have a clear path. Avoid moving your furniture around.  If any of your floors are uneven, fix them.  If there are any pets around you, be aware of where they are.  Review your medicines with your doctor. Some medicines can make you feel dizzy. This can increase your chance of falling. Ask your doctor what other things that you can do to help prevent falls. This information is not intended to replace advice given to you by your health care provider. Make sure you discuss any questions you have with your health care provider. Document Released: 01/06/2009 Document Revised: 08/18/2015 Document Reviewed: 04/16/2014 Elsevier Interactive Patient Education  2017 [employer].

## 2018-06-16 ENCOUNTER — Ambulatory Visit: Payer: Medicare Other | Admitting: Family Medicine

## 2018-06-16 ENCOUNTER — Telehealth: Payer: Self-pay | Admitting: Family Medicine

## 2018-06-16 NOTE — Telephone Encounter (Signed)
Pt had appt for today but due to Dr Sanda Klein being out of the office she had to resch for 4.30.2020. she is asking that someone please refill vitamin D3 please sent to [place]. Also need to know if she need to continue to take the calcium and the D3 together

## 2018-06-16 NOTE — Telephone Encounter (Signed)
Continue calcium and vitamin D supplementation. Take vitamin D3 2,000 IU daily in place of rx strength.

## 2018-06-16 NOTE — Telephone Encounter (Signed)
Pt.notified

## 2018-06-16 NOTE — Telephone Encounter (Signed)
Is patient supposed to do OTC vitamin D now?

## 2018-07-09 ENCOUNTER — Other Ambulatory Visit: Payer: Self-pay | Admitting: Family Medicine

## 2018-07-09 NOTE — Telephone Encounter (Signed)
Patient does have an appt later this month Lab Results  Component Value Date   HGBA1C 5.6 12/16/2017   Lab Results  Component Value Date   CREATININE 0.96 (H) 12/16/2017

## 2018-07-24 ENCOUNTER — Ambulatory Visit: Payer: Medicare Other | Admitting: Family Medicine

## 2018-07-24 ENCOUNTER — Ambulatory Visit (INDEPENDENT_AMBULATORY_CARE_PROVIDER_SITE_OTHER): Payer: Medicare Other | Admitting: Nurse Practitioner

## 2018-07-24 ENCOUNTER — Other Ambulatory Visit: Payer: Self-pay

## 2018-07-24 ENCOUNTER — Encounter: Payer: Self-pay | Admitting: Nurse Practitioner

## 2018-07-24 VITALS — BP 130/70

## 2018-07-24 DIAGNOSIS — E782 Mixed hyperlipidemia: Secondary | ICD-10-CM

## 2018-07-24 DIAGNOSIS — R7303 Prediabetes: Secondary | ICD-10-CM

## 2018-07-24 DIAGNOSIS — E041 Nontoxic single thyroid nodule: Secondary | ICD-10-CM

## 2018-07-24 DIAGNOSIS — I10 Essential (primary) hypertension: Secondary | ICD-10-CM

## 2018-07-24 DIAGNOSIS — K5909 Other constipation: Secondary | ICD-10-CM | POA: Diagnosis not present

## 2018-07-24 DIAGNOSIS — M1A00X0 Idiopathic chronic gout, unspecified site, without tophus (tophi): Secondary | ICD-10-CM

## 2018-07-24 DIAGNOSIS — M810 Age-related osteoporosis without current pathological fracture: Secondary | ICD-10-CM | POA: Diagnosis not present

## 2018-07-24 MED ORDER — AMLODIPINE BESYLATE 10 MG PO TABS: 10.0000 mg | ORAL_TABLET | Freq: Every day | ORAL | 1 refills | Status: DC

## 2018-07-24 MED ORDER — METFORMIN HCL ER 500 MG PO TB24: 1000.0000 mg | ORAL_TABLET | Freq: Every day | ORAL | 1 refills | Status: DC

## 2018-07-24 MED ORDER — ALLOPURINOL 100 MG PO TABS: 100.0000 mg | ORAL_TABLET | Freq: Every day | ORAL | 1 refills | Status: DC

## 2018-07-24 MED ORDER — ATORVASTATIN CALCIUM 10 MG PO TABS: ORAL_TABLET | ORAL | 1 refills | Status: DC

## 2018-07-24 MED ORDER — ATENOLOL 50 MG PO TABS: 50.0000 mg | ORAL_TABLET | Freq: Every day | ORAL | 1 refills | Status: DC

## 2018-07-24 MED ORDER — ALENDRONATE SODIUM 70 MG PO TABS: ORAL_TABLET | ORAL | 1 refills | Status: DC

## 2018-07-24 NOTE — Progress Notes (Signed)
Virtual Visit via Telephone Note  I connected with Tracy Bolton on 07/24/18 at 10:20 AM EDT by telephone and verified that I am speaking with the correct person using two identifiers.   Staff discussed the limitations, risks, security and privacy concerns of performing an evaluation and management service by telephone and the availability of in person appointments. Staff also discussed with the patient that there may be a patient responsible charge related to this service. The patient expressed understanding and agreed to proceed.  Patients location: home My location: home office  Other people in meeting: none    HPI Hypertension patient is prescribed atenolol 50mg , amlodipine 10mg , Denies lightheadedness, dizziness, chest pain, blurry vision BP Readings from Last 3 Encounters:  07/24/18 130/70  06/10/18 112/66  12/16/17 126/72    Hyperlipidemia Patient is taking  Atorvastatin 10mg  nigthly Lab Results  Component Value Date   CHOL 112 12/16/2017   HDL 49 (L) 12/16/2017   LDLCALC 44 12/16/2017   TRIG 108 12/16/2017   CHOLHDL 2.3 12/16/2017   Osteoporosis Takes fosamax 70mg  weekly; calcium and vitamin D supplementation. Walks around the [place] and [place] 4 times a week. No falls, denies unsteadiness on her feet.   Goiter Sees [facility] endo- Dr. Shanon Brow [place] who monitors this  Gout arthritis Patient takes allopurinol daily and follows low purine diet, states rarely gets flares now.   Constipation Patient takes senna daily, states has 1-2 times a day. Denies straining, no blood in stools  Prediabetes Takes metformin 1000mg  daily  . PHQ2/9: Depression screen [facility] 2/9 07/24/2018 06/10/2018 12/16/2017 06/06/2017 05/21/2017  Decreased Interest 0 0 0 0 0  Down, Depressed, Hopeless 0 0 0 0 0  PHQ - 2 Score 0 0 0 0 0  Altered sleeping 0 0 0 - -  Tired, decreased energy 0 0 0 - -  Change in appetite 0 0 0 - -  Feeling bad or failure about yourself  0 0 0 - -  Trouble  concentrating 0 0 0 - -  Moving slowly or fidgety/restless 0 0 0 - -  Suicidal thoughts 0 0 0 - -  PHQ-9 Score 0 0 0 - -  Difficult doing work/chores Not difficult at all Not difficult at all - - -    PHQ reviewed. Negative  Patient Active Problem List   Diagnosis Date Noted  . Urine test positive for microalbuminuria 06/06/2017  . Full code status 05/21/2017  . Preventative health care 05/21/2017  . Elevated serum glutamic pyruvic transaminase (SGPT) level 05/19/2016  . Callus of foot 05/18/2016  . Urine abnormality 11/14/2015  . Encounter for medication monitoring 11/14/2015  . Hyperlipidemia 11/14/2015  . Pre-diabetes 05/18/2015  . OP (osteoporosis) 09/06/2014  . Chronic gouty arthritis 09/06/2014  . Obesity, Class III, BMI 40-49.9 (morbid obesity) ([place]) 09/06/2014  . 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 Hypertension goal BP (blood pressure) < 140/90 07/12/2014  . Weakness of both lower extremities 07/12/2014  . Dry skin 07/12/2014  . Right-sided uninodular goiter 07/12/2014  . Cyst of eye 07/12/2014  . Constipation, chronic 07/12/2014  . Subclinical hypothyroidism 02/06/2013    Past Medical History:  Diagnosis Date  . Arthritis   . Gout, chronic   . Hyperlipidemia   . Hypertension   . Osteoporosis   . Thyroid disease     Past Surgical History:  Procedure Laterality Date  . CATARACT EXTRACTION, BILATERAL  02/2018  . COLONOSCOPY  2016  . LUNG SURGERY Right 2004  biopsy normal    Social History   Tobacco Use  . Smoking status: Never Smoker  . Smokeless tobacco: Never Used  Substance Use Topics  . Alcohol use: No    Alcohol/week: 0.0 standard drinks     Current Outpatient Medications:  .  alendronate (FOSAMAX) 70 MG tablet, TAKE 1 TABLET BY MOUTH ONCE A WEEK TAKE WITH FULL GLASS OF WATER ON EMPTY STOMACH, Disp: 12 tablet, Rfl: 3 .  allopurinol (ZYLOPRIM) 100 MG tablet, Take 1 tablet (100 mg total) by mouth daily., Disp: 90 tablet, Rfl:  3 .  amLODipine (NORVASC) 10 MG tablet, Take 1 tablet (10 mg total) by mouth daily., Disp: 90 tablet, Rfl: 3 .  aspirin 81 MG tablet, Take 81 mg by mouth., Disp: , Rfl:  .  atenolol (TENORMIN) 50 MG tablet, Take 1 tablet (50 mg total) by mouth daily., Disp: 90 tablet, Rfl: 3 .  atorvastatin (LIPITOR) 10 MG tablet, TAKE 1 TABLET BY MOUTH EVERYDAY AT BEDTIME, Disp: 90 tablet, Rfl: 2 .  [facility] SENNA 8.6 MG tablet, TAKE 1 TABLET (8.6 MG TOTAL) BY MOUTH 2 (TWO) TIMES DAILY AS NEEDED FOR CONSTIPATION., Disp: 180 tablet, Rfl: 1 .  metFORMIN (GLUCOPHAGE-XR) 500 MG 24 hr tablet, TAKE 2 TABLETS BY MOUTH EVERY DAY, Disp: 180 tablet, Rfl: 0 .  Multiple Vitamin (MULTI-VITAMINS) TABS, Take by mouth., Disp: , Rfl:  .  nystatin (NYAMYC) powder, Apply topically 3 (three) times daily. Use sufficient amount TID on affected areas of skin as needed, Disp: 30 g, Rfl: 5  No Known Allergies  ROS   No other specific complaints in a complete review of systems (except as listed in HPI above).  Objective  Vitals:   07/24/18 1028  BP: 130/70     There is no height or weight on file to calculate BMI.  Patient is alert, able to speak in full sentences without difficulty.   Assessment & Plan  1. Hypertension goal BP (blood pressure) < 140/90 Stable, continue treatment  - amLODipine (NORVASC) 10 MG tablet; Take 1 tablet (10 mg total) by mouth daily.  Dispense: 90 tablet; Refill: 1 - atenolol (TENORMIN) 50 MG tablet; Take 1 tablet (50 mg total) by mouth daily.  Dispense: 90 tablet; Refill: 1  2. Constipation, chronic Stable, continue treatment   3. Age-related osteoporosis without current pathological fracture Weight bearing, supplementation - alendronate (FOSAMAX) 70 MG tablet; TAKE 1 TABLET BY MOUTH ONCE A WEEK TAKE WITH FULL GLASS OF WATER ON EMPTY STOMACH  Dispense: 12 tablet; Refill: 1  4. Right-sided uninodular goiter Follow up with endo   5. Chronic gouty arthritis Stable, continue treatment  -  allopurinol (ZYLOPRIM) 100 MG tablet; Take 1 tablet (100 mg total) by mouth daily.  Dispense: 90 tablet; Refill: 1  6. Pre-diabetes Consider next a1c check reducine to 500mg  daily - metFORMIN (GLUCOPHAGE-XR) 500 MG 24 hr tablet; Take 2 tablets (1,000 mg total) by mouth daily.  Dispense: 180 tablet; Refill: 1  7. Mixed hyperlipidemia Stable, continue treatment  - atorvastatin (LIPITOR) 10 MG tablet; TAKE 1 TABLET BY MOUTH EVERYDAY AT BEDTIME  Dispense: 90 tablet; Refill: 1    I discussed the assessment and treatment plan with the patient. The patient was provided an opportunity to ask questions and all were answered. The patient agreed with the plan and demonstrated an understanding of the instructions.   The patient was advised to call back or seek an in-person evaluation if the symptoms worsen or if the condition fails to  improve as anticipated.  I provided 14 minutes of non-face-to-face time during this encounter.   Fredderick Severance, NP

## 2018-09-25 ENCOUNTER — Ambulatory Visit: Payer: Medicare Other

## 2018-12-17 ENCOUNTER — Ambulatory Visit
Admission: RE | Admit: 2018-12-17 | Discharge: 2018-12-17 | Disposition: A | Discharge status: Home / Self Care | Payer: Medicare Other | Source: Ambulatory Visit | Attending: Family Medicine | Admitting: Family Medicine

## 2018-12-17 DIAGNOSIS — Z1231 Encounter for screening mammogram for malignant neoplasm of breast: Secondary | ICD-10-CM | POA: Diagnosis not present

## 2019-01-23 ENCOUNTER — Encounter: Payer: Self-pay | Admitting: Family Medicine

## 2019-01-23 ENCOUNTER — Ambulatory Visit (INDEPENDENT_AMBULATORY_CARE_PROVIDER_SITE_OTHER): Payer: Medicare Other | Admitting: Family Medicine

## 2019-01-23 ENCOUNTER — Other Ambulatory Visit: Payer: Self-pay

## 2019-01-23 DIAGNOSIS — Z5181 Encounter for therapeutic drug level monitoring: Secondary | ICD-10-CM

## 2019-01-23 DIAGNOSIS — E782 Mixed hyperlipidemia: Secondary | ICD-10-CM

## 2019-01-23 DIAGNOSIS — M1A00X0 Idiopathic chronic gout, unspecified site, without tophus (tophi): Secondary | ICD-10-CM

## 2019-01-23 DIAGNOSIS — R7303 Prediabetes: Secondary | ICD-10-CM

## 2019-01-23 DIAGNOSIS — M810 Age-related osteoporosis without current pathological fracture: Secondary | ICD-10-CM

## 2019-01-23 DIAGNOSIS — I10 Essential (primary) hypertension: Secondary | ICD-10-CM

## 2019-01-23 DIAGNOSIS — Z7983 Long term (current) use of bisphosphonates: Secondary | ICD-10-CM

## 2019-01-23 NOTE — Progress Notes (Deleted)
Name: Tracy Bolton   MRN: [id number]    DOB: 1941-03-04   Date:01/23/2019       Progress Note  Subjective:    Chief Complaint  Chief Complaint  Patient presents with  . Follow-up  . Hyperlipidemia  . Hypertension    I connected with  [name]  on 01/23/19 at 11:00 AM EDT by a video enabled telemedicine application and verified that I am speaking with the correct person using two identifiers.  I discussed the limitations of evaluation and management by telemedicine and the availability of in person appointments. The patient expressed understanding and agreed to proceed. Staff also discussed with the patient that there may be a patient responsible charge related to this service. Patient Location: *** Provider Location: *** Additional Individuals present: ***  HPI  Patient Active Problem List   Diagnosis Date Noted  . Urine test positive for microalbuminuria 06/06/2017  . Full code status 05/21/2017  . Preventative health care 05/21/2017  . Elevated serum glutamic pyruvic transaminase (SGPT) level 05/19/2016  . Callus of foot 05/18/2016  . Urine abnormality 11/14/2015  . Encounter for medication monitoring 11/14/2015  . Hyperlipidemia 11/14/2015  . Pre-diabetes 05/18/2015  . OP (osteoporosis) 09/06/2014  . Chronic gouty arthritis 09/06/2014  . Obesity, Class III, BMI 40-49.9 (morbid obesity) ([place]) 09/06/2014  . 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 Hypertension goal BP (blood pressure) < 140/90 07/12/2014  . Weakness of both lower extremities 07/12/2014  . Dry skin 07/12/2014  . Right-sided uninodular goiter 07/12/2014  . Cyst of eye 07/12/2014  . Constipation, chronic 07/12/2014  . Subclinical hypothyroidism 02/06/2013    Past Surgical History:  Procedure Laterality Date  . CATARACT EXTRACTION, BILATERAL  02/2018  . COLONOSCOPY  2016  . LUNG SURGERY Right 2004   biopsy normal    Family History  Problem Relation Age of Onset  . Bone  cancer Father 50  . Thyroid disease Sister   . Cancer Mother 108       brain tumor  . Breast cancer Neg Hx     Social History   Socioeconomic History  . Marital status: Single    Spouse name: Not on file  . Number of children: 2  . Years of education: Not on file  . Highest education level: Some college, no degree  Occupational History  . Occupation: retired  Scientific laboratory technician  . Financial resource strain: Not hard at all  . Food insecurity    Worry: Never true    Inability: Never true  . Transportation needs    Medical: No    Non-medical: No  Tobacco Use  . Smoking status: Never Smoker  . Smokeless tobacco: Never Used  Substance and Sexual Activity  . Alcohol use: No    Alcohol/week: 0.0 standard drinks  . Drug use: No  . Sexual activity: Not Currently  Lifestyle  . Physical activity    Days per week: 7 days    Minutes per session: 20 min  . Stress: Not at all  Relationships  . Social connections    Talks on phone: More than three times a week    Gets together: More than three times a week    Attends religious service: More than 4 times per year    Active member of club or organization: No    Attends meetings of clubs or organizations: Never    Relationship status: Divorced  . Intimate partner violence    Fear of current  or ex partner: No    Emotionally abused: No    Physically abused: No    Forced sexual activity: No  Other Topics Concern  . Not on file  Social History Narrative  . Not on file     Current Outpatient Medications:  .  alendronate (FOSAMAX) 70 MG tablet, TAKE 1 TABLET BY MOUTH ONCE A WEEK TAKE WITH FULL GLASS OF WATER ON EMPTY STOMACH, Disp: 12 tablet, Rfl: 1 .  allopurinol (ZYLOPRIM) 100 MG tablet, Take 1 tablet (100 mg total) by mouth daily., Disp: 90 tablet, Rfl: 1 .  amLODipine (NORVASC) 10 MG tablet, Take 1 tablet (10 mg total) by mouth daily., Disp: 90 tablet, Rfl: 1 .  aspirin 81 MG tablet, Take 81 mg by mouth., Disp: , Rfl:  .  atenolol  (TENORMIN) 50 MG tablet, Take 1 tablet (50 mg total) by mouth daily., Disp: 90 tablet, Rfl: 1 .  atorvastatin (LIPITOR) 10 MG tablet, TAKE 1 TABLET BY MOUTH EVERYDAY AT BEDTIME, Disp: 90 tablet, Rfl: 1 .  [facility] SENNA 8.6 MG tablet, TAKE 1 TABLET (8.6 MG TOTAL) BY MOUTH 2 (TWO) TIMES DAILY AS NEEDED FOR CONSTIPATION., Disp: 180 tablet, Rfl: 1 .  metFORMIN (GLUCOPHAGE-XR) 500 MG 24 hr tablet, Take 2 tablets (1,000 mg total) by mouth daily., Disp: 180 tablet, Rfl: 1 .  Multiple Vitamin (MULTI-VITAMINS) TABS, Take by mouth., Disp: , Rfl:  .  nystatin (NYAMYC) powder, Apply topically 3 (three) times daily. Use sufficient amount TID on affected areas of skin as needed, Disp: 30 g, Rfl: 5  No Known Allergies  I personally reviewed {Reviewed:14835} with the patient/caregiver today.  Review of Systems    Objective:    Virtual encounter, vitals limited, only able to obtain the following There were no vitals filed for this visit. There is no height or weight on file to calculate BMI. Nursing Note and Vital Signs reviewed.  Physical Exam  PE limited by telephone encounter  No results found for this or any previous visit (from the past 72 hour(s)).  PHQ2/9: Depression screen [facility] 2/9 01/23/2019 07/24/2018 06/10/2018 12/16/2017 06/06/2017  Decreased Interest 0 0 0 0 0  Down, Depressed, Hopeless 0 0 0 0 0  PHQ - 2 Score 0 0 0 0 0  Altered sleeping 0 0 0 0 -  Tired, decreased energy 0 0 0 0 -  Change in appetite 0 0 0 0 -  Feeling bad or failure about yourself  0 0 0 0 -  Trouble concentrating 0 0 0 0 -  Moving slowly or fidgety/restless 0 0 0 0 -  Suicidal thoughts 0 0 0 0 -  PHQ-9 Score 0 0 0 0 -  Difficult doing work/chores Not difficult at all Not difficult at all Not difficult at all - -   PHQ-2/9 Result is {gen negative/positive:315881}.    Fall Risk: Fall Risk  01/23/2019 07/24/2018 06/10/2018 12/16/2017 06/06/2017  Falls in the past year? 0 0 0 No No  Comment - - - - -  Number falls in  past yr: 0 0 0 - -  Comment - - - - -  Injury with Fall? 0 0 0 - -  Risk for fall due to : - - - - -  Follow up - Falls evaluation completed Falls prevention discussed - -     Assessment and Plan:   Problem List Items Addressed This Visit      Cardiovascular and Mediastinum   Hypertension goal BP (blood pressure) <  140/90 - Primary (Chronic)   Relevant Orders   COMPLETE METABOLIC PANEL WITH GFR     Musculoskeletal and Integument   OP (osteoporosis)   Relevant Orders   COMPLETE METABOLIC PANEL WITH GFR   VITAMIN D 25 Hydroxy (Vit-D Deficiency, Fractures)   Chronic gouty arthritis   Relevant Orders   COMPLETE METABOLIC PANEL WITH GFR   Uric acid     Other   Pre-diabetes   Relevant Orders   COMPLETE METABOLIC PANEL WITH GFR   Hemoglobin A1c   Encounter for medication monitoring   Relevant Orders   CBC with Differential/Platelet   COMPLETE METABOLIC PANEL WITH GFR   Lipid panel   Hemoglobin A1c   Uric acid   VITAMIN D 25 Hydroxy (Vit-D Deficiency, Fractures)   Hyperlipidemia   Relevant Orders   COMPLETE METABOLIC PANEL WITH GFR   Lipid panel    Other Visit Diagnoses    Encounter for monitoring alendronate therapy       Relevant Orders   COMPLETE METABOLIC PANEL WITH GFR   VITAMIN D 25 Hydroxy (Vit-D Deficiency, Fractures)       I discussed the assessment and treatment plan with the patient. The patient was provided an opportunity to ask questions and all were answered. The patient agreed with the plan and demonstrated an understanding of the instructions.  The patient was advised to call back or seek an in-person evaluation if the symptoms worsen or if the condition fails to improve as anticipated.  I provided *** minutes of non-face-to-face time during this encounter.  Delsa Grana, PA-C 01/22/2010:38 AM

## 2019-01-23 NOTE — Progress Notes (Signed)
Name: Tracy Bolton   MRN: [id number]    DOB: 12/02/1940   Date:01/23/2019       Progress Note  Subjective:    Chief Complaint  Chief Complaint  Patient presents with  . Follow-up  . Hyperlipidemia  . Hypertension    I connected with  [name]  on 01/23/19 at 11:00 AM EDT by a video enabled telemedicine application and verified that I am speaking with the correct person using two identifiers.  I discussed the limitations of evaluation and management by telemedicine and the availability of in person appointments. The patient expressed understanding and agreed to proceed. Staff also discussed with the patient that there may be a patient responsible charge related to this service.  HPI  Hypertension:  Pt diagnosed with HTN more than 5 years Currently managed on atenolol and amlodipine Pt reports good med compliance and denies any SE.  No lightheadedness, hypotension, syncope. Blood pressure is monitored at home, hasn't been in office for several months, usually runs around 120-130/60-70's didn't check today BP Readings from Last 3 Encounters:  07/24/18 130/70  06/10/18 112/66  12/16/17 126/72   Pt denies CP, SOB, exertional sx, LE edema, palpitation, Ha's, visual disturbances Dietary efforts for BP?  Low salt  Hyperlipidemia: Current Medication Regimen: atorvastatin 10 mg  Last Lipids: Lab Results  Component Value Date   CHOL 112 12/16/2017   HDL 49 (L) 12/16/2017   LDLCALC 44 12/16/2017   TRIG 108 12/16/2017   CHOLHDL 2.3 12/16/2017   - Current Diet:  Overall healthy avoid fried food, tried to eat lean meat - Denies: Chest pain, shortness of breath, myalgias. - Documented aortic atherosclerosis? No - Risk factors for atherosclerosis: diabetes mellitus, hypercholesterolemia and hypertension   Gout - on allopurinol 100 mg, no recent gouty flares, last Uric acid level was elevated at  Lab Results  Component Value Date   LABURIC 7.4 (H) 12/16/2017  Meds were  not adjusted in the last year.    Osteoporosis - on fosamax, taking daily, no dysphagia, on a multivitamin  Hyperglycemia and obesity/prediabetes -  A1C has been improving over the past 2 years Dr. Sanda Klein had put her on metformin 1000 mg once daily XR  No hypoglycemic episodes, not checking sugars Denies: Polyuria, polydipsia, polyphagia, vision changes, or neuropathy Weight loss - she has lost weight.  But not sure how much  Recent pertinent labs: Lab Results  Component Value Date   HGBA1C 5.6 12/16/2017   HGBA1C 5.5 06/06/2017   HGBA1C 6.0 (H) 11/19/2016      Component Value Date/Time   NA 140 12/16/2017 1534   NA 141 05/19/2015 0951   NA 142 09/15/2013 1906   K 3.7 12/16/2017 1534   K 3.7 09/15/2013 1906   CL 103 12/16/2017 1534   CL 108 (H) 09/15/2013 1906   CO2 27 12/16/2017 1534   CO2 23 09/15/2013 1906   GLUCOSE 122 (H) 12/16/2017 1534   GLUCOSE 123 (H) 09/15/2013 1906   BUN 15 12/16/2017 1534   BUN 15 05/19/2015 0951   BUN 10 09/15/2013 1906   CREATININE 0.96 (H) 12/16/2017 1534   CALCIUM 10.0 12/16/2017 1534   CALCIUM 8.9 09/15/2013 1906   PROT 7.2 12/16/2017 1534   PROT 7.6 05/19/2015 0951   PROT 6.3 (L) 09/15/2013 1906   ALBUMIN 4.1 11/19/2016 1045   ALBUMIN 4.3 05/19/2015 0951   ALBUMIN 3.0 (L) 09/15/2013 1906   AST 27 12/16/2017 1534   AST 22 09/15/2013 1906  ALT 23 12/16/2017 1534   ALT 20 09/15/2013 1906   ALKPHOS 44 11/19/2016 1045   ALKPHOS 82 09/15/2013 1906   BILITOT 1.0 12/16/2017 1534   BILITOT 0.7 05/19/2015 0951   BILITOT 0.5 09/15/2013 1906   GFRNONAA 58 (L) 12/16/2017 1534   GFRAA 68 12/16/2017 1534    Current diet: in general, a "healthy" diet   Current exercise: none    Patient Active Problem List   Diagnosis Date Noted  . Urine test positive for microalbuminuria 06/06/2017  . Full code status 05/21/2017  . Preventative health care 05/21/2017  . Elevated serum glutamic pyruvic transaminase (SGPT) level 05/19/2016  . Callus  of foot 05/18/2016  . Urine abnormality 11/14/2015  . Encounter for medication monitoring 11/14/2015  . Hyperlipidemia 11/14/2015  . Pre-diabetes 05/18/2015  . OP (osteoporosis) 09/06/2014  . Chronic gouty arthritis 09/06/2014  . Obesity, Class III, BMI 40-49.9 (morbid obesity) ([place]) 09/06/2014  . 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 Hypertension goal BP (blood pressure) < 140/90 07/12/2014  . Weakness of both lower extremities 07/12/2014  . Dry skin 07/12/2014  . Right-sided uninodular goiter 07/12/2014  . Cyst of eye 07/12/2014  . Constipation, chronic 07/12/2014  . Subclinical hypothyroidism 02/06/2013    Past Surgical History:  Procedure Laterality Date  . CATARACT EXTRACTION, BILATERAL  02/2018  . COLONOSCOPY  2016  . LUNG SURGERY Right 2004   biopsy normal    Family History  Problem Relation Age of Onset  . Bone cancer Father 52  . Thyroid disease Sister   . Cancer Mother 62       brain tumor  . Breast cancer Neg Hx     Social History   Socioeconomic History  . Marital status: Single    Spouse name: Not on file  . Number of children: 2  . Years of education: Not on file  . Highest education level: Some college, no degree  Occupational History  . Occupation: retired  Scientific laboratory technician  . Financial resource strain: Not hard at all  . Food insecurity    Worry: Never true    Inability: Never true  . Transportation needs    Medical: No    Non-medical: No  Tobacco Use  . Smoking status: Never Smoker  . Smokeless tobacco: Never Used  Substance and Sexual Activity  . Alcohol use: No    Alcohol/week: 0.0 standard drinks  . Drug use: No  . Sexual activity: Not Currently  Lifestyle  . Physical activity    Days per week: 7 days    Minutes per session: 20 min  . Stress: Not at all  Relationships  . Social connections    Talks on phone: More than three times a week    Gets together: More than three times a week    Attends religious service:  More than 4 times per year    Active member of club or organization: No    Attends meetings of clubs or organizations: Never    Relationship status: Divorced  . Intimate partner violence    Fear of current or ex partner: No    Emotionally abused: No    Physically abused: No    Forced sexual activity: No  Other Topics Concern  . Not on file  Social History Narrative  . Not on file     Current Outpatient Medications:  .  alendronate (FOSAMAX) 70 MG tablet, TAKE 1 TABLET BY MOUTH ONCE A WEEK TAKE WITH FULL GLASS OF  WATER ON EMPTY STOMACH, Disp: 12 tablet, Rfl: 1 .  allopurinol (ZYLOPRIM) 100 MG tablet, Take 1 tablet (100 mg total) by mouth daily., Disp: 90 tablet, Rfl: 1 .  amLODipine (NORVASC) 10 MG tablet, Take 1 tablet (10 mg total) by mouth daily., Disp: 90 tablet, Rfl: 1 .  aspirin 81 MG tablet, Take 81 mg by mouth., Disp: , Rfl:  .  atenolol (TENORMIN) 50 MG tablet, Take 1 tablet (50 mg total) by mouth daily., Disp: 90 tablet, Rfl: 1 .  atorvastatin (LIPITOR) 10 MG tablet, TAKE 1 TABLET BY MOUTH EVERYDAY AT BEDTIME, Disp: 90 tablet, Rfl: 1 .  [facility] SENNA 8.6 MG tablet, TAKE 1 TABLET (8.6 MG TOTAL) BY MOUTH 2 (TWO) TIMES DAILY AS NEEDED FOR CONSTIPATION., Disp: 180 tablet, Rfl: 1 .  metFORMIN (GLUCOPHAGE-XR) 500 MG 24 hr tablet, Take 2 tablets (1,000 mg total) by mouth daily., Disp: 180 tablet, Rfl: 1 .  Multiple Vitamin (MULTI-VITAMINS) TABS, Take by mouth., Disp: , Rfl:  .  nystatin (NYAMYC) powder, Apply topically 3 (three) times daily. Use sufficient amount TID on affected areas of skin as needed, Disp: 30 g, Rfl: 5  No Known Allergies  I personally reviewed active problem list, medication list, allergies, family history, social history, health maintenance, notes from last encounter, lab results with the patient/caregiver today.  Review of Systems  Constitutional: Negative.   HENT: Negative.   Eyes: Negative.   Respiratory: Negative.   Cardiovascular: Negative.    Gastrointestinal: Negative.   Endocrine: Negative.   Genitourinary: Negative.   Musculoskeletal: Negative.   Skin: Negative.   Allergic/Immunologic: Negative.   Neurological: Negative.   Hematological: Negative.   Psychiatric/Behavioral: Negative.   All other systems reviewed and are negative.     Objective:    Virtual encounter, vitals limited, only able to obtain the following: There were no vitals filed for this visit. There is no height or weight on file to calculate BMI. Nursing Note and Vital Signs reviewed.  Physical Exam Vitals signs and nursing note reviewed.  Constitutional:      General: She is not in acute distress.    Appearance: She is well-developed. She is obese. She is not ill-appearing, toxic-appearing or diaphoretic.  HENT:     Head: Normocephalic and atraumatic.     Nose: Nose normal.  Eyes:     General:        Right eye: No discharge.        Left eye: No discharge.     Conjunctiva/sclera: Conjunctivae normal.  Neck:     Trachea: No tracheal deviation.  Pulmonary:     Effort: Pulmonary effort is normal. No respiratory distress.     Breath sounds: No stridor.  Skin:    Coloration: Skin is not jaundiced or pale.     Findings: No rash.  Neurological:     Mental Status: She is alert.     Motor: No abnormal muscle tone.     Coordination: Coordination normal.  Psychiatric:        Mood and Affect: Mood normal.        Behavior: Behavior normal.     PE limited by telephone encounter  No results found for this or any previous visit (from the past 72 hour(s)).  PHQ2/9: Depression screen [facility] 2/9 01/23/2019 07/24/2018 06/10/2018 12/16/2017 06/06/2017  Decreased Interest 0 0 0 0 0  Down, Depressed, Hopeless 0 0 0 0 0  PHQ - 2 Score 0 0 0 0 0  Altered sleeping  0 0 0 0 -  Tired, decreased energy 0 0 0 0 -  Change in appetite 0 0 0 0 -  Feeling bad or failure about yourself  0 0 0 0 -  Trouble concentrating 0 0 0 0 -  Moving slowly or fidgety/restless  0 0 0 0 -  Suicidal thoughts 0 0 0 0 -  PHQ-9 Score 0 0 0 0 -  Difficult doing work/chores Not difficult at all Not difficult at all Not difficult at all - -   PHQ-2/9 Result is negative.    Fall Risk: Fall Risk  01/23/2019 07/24/2018 06/10/2018 12/16/2017 06/06/2017  Falls in the past year? 0 0 0 No No  Comment - - - - -  Number falls in past yr: 0 0 0 - -  Comment - - - - -  Injury with Fall? 0 0 0 - -  Risk for fall due to : - - - - -  Follow up - Falls evaluation completed Falls prevention discussed - -     Assessment and Plan:       ICD-10-CM   1. Hypertension goal BP (blood pressure) < 140/90  [id number] COMPLETE METABOLIC PANEL WITH GFR   at goal, no med SE or concerns, needs labs, asked to come into clinic ASAP   2. Mixed hyperlipidemia  99991111 COMPLETE METABOLIC PANEL WITH GFR    Lipid panel   tolerating statin and working on lifestyle, labs ordered  3. Age-related osteoporosis without current pathological fracture  [id number] COMPLETE METABOLIC PANEL WITH GFR    VITAMIN D 25 Hydroxy (Vit-D Deficiency, Fractures)   tolerating fosamax, not supplementing with calcium or vit d specifically, but on multivitamin, need to check levels  4. Pre-diabetes  [id number] COMPLETE METABOLIC PANEL WITH GFR    Hemoglobin A1c   wonderul improvement in A1C, will recheck and pt hoping to decrease metformin to 500 mg once daily  5. Chronic gouty arthritis  [id number] COMPLETE METABOLIC PANEL WITH GFR    Uric acid   last uric acid not at goal of < 6.0, but pt has not had flares, and she is not concerned, will recheck renal funciton and uric acid  6. Encounter for medication monitoring  Z51.81 CBC with Differential/Platelet    COMPLETE METABOLIC PANEL WITH GFR    Lipid panel    Hemoglobin A1c    Uric acid    VITAMIN D 25 Hydroxy (Vit-D Deficiency, Fractures)  7. Encounter for monitoring alendronate therapy  [id number] COMPLETE METABOLIC PANEL WITH GFR   Z79.83 VITAMIN D 25 Hydroxy (Vit-D Deficiency, Fractures)    vit d and calcium- need to monitor, pt not currently supplementing, no problems with how pt is taking, no sx or se  8. Obesity, Class III, BMI 40-49.9 (morbid obesity) ([place]) Chronic E66.01    pt says shes lost weight, no scale, will recheck when she comes in for labs    I discussed the assessment and treatment plan with the patient. The patient was provided an opportunity to ask questions and all were answered. The patient agreed with the plan and demonstrated an understanding of the instructions.  The patient was advised to call back or seek an in-person evaluation if the symptoms worsen or if the condition fails to improve as anticipated.  I provided 20 minutes of non-face-to-face time during this encounter.  Delsa Grana, PA-C 01/23/19 11:23 AM

## 2019-01-24 ENCOUNTER — Other Ambulatory Visit: Payer: Self-pay | Admitting: Family Medicine

## 2019-01-24 DIAGNOSIS — B372 Candidiasis of skin and nail: Secondary | ICD-10-CM

## 2019-02-07 LAB — CBC WITH DIFFERENTIAL/PLATELET
Absolute Monocytes: 952 cells/uL — ABNORMAL HIGH (ref 200–950)
Basophils Absolute: 61 cells/uL (ref 0–200)
Basophils Relative: 0.5 %
Eosinophils Absolute: 317 cells/uL (ref 15–500)
Eosinophils Relative: 2.6 %
HCT: 39.9 % (ref 35.0–45.0)
Hemoglobin: 12.7 g/dL (ref 11.7–15.5)
Lymphs Abs: 4075 cells/uL — ABNORMAL HIGH (ref 850–3900)
MCH: 27.1 pg (ref 27.0–33.0)
MCHC: 31.8 g/dL — ABNORMAL LOW (ref 32.0–36.0)
MCV: 85.1 fL (ref 80.0–100.0)
MPV: 11.3 fL (ref 7.5–12.5)
Monocytes Relative: 7.8 %
Neutro Abs: 6795 cells/uL (ref 1500–7800)
Neutrophils Relative %: 55.7 %
Platelets: 236 10*3/uL (ref 140–400)
RBC: 4.69 10*6/uL (ref 3.80–5.10)
RDW: 12.6 % (ref 11.0–15.0)
Total Lymphocyte: 33.4 %
WBC: 12.2 10*3/uL — ABNORMAL HIGH (ref 3.8–10.8)

## 2019-02-07 LAB — COMPLETE METABOLIC PANEL WITH GFR
AG Ratio: 1.2 (calc) (ref 1.0–2.5)
ALT: 14 U/L (ref 6–29)
AST: 18 U/L (ref 10–35)
Albumin: 4.2 g/dL (ref 3.6–5.1)
Alkaline phosphatase (APISO): 53 U/L (ref 37–153)
BUN/Creatinine Ratio: 15 (calc) (ref 6–22)
BUN: 15 mg/dL (ref 7–25)
CO2: 25 mmol/L (ref 20–32)
Calcium: 9.9 mg/dL (ref 8.6–10.4)
Chloride: 103 mmol/L (ref 98–110)
Creat: 1.01 mg/dL — ABNORMAL HIGH (ref 0.60–0.93)
GFR, Est African American: 63 mL/min/{1.73_m2} (ref >=60)
GFR, Est Non African American: 54 mL/min/{1.73_m2} — ABNORMAL LOW (ref >=60)
Globulin: 3.4 g/dL (calc) (ref 1.9–3.7)
Glucose, Bld: 128 mg/dL — ABNORMAL HIGH (ref 65–99)
Potassium: 4.1 mmol/L (ref 3.5–5.3)
Sodium: 141 mmol/L (ref 135–146)
Total Bilirubin: 0.6 mg/dL (ref 0.2–1.2)
Total Protein: 7.6 g/dL (ref 6.1–8.1)

## 2019-02-07 LAB — LIPID PANEL
Cholesterol: 105 mg/dL (ref <200)
HDL: 47 mg/dL — ABNORMAL LOW (ref >=50)
LDL Cholesterol (Calc): 41 mg/dL (calc)
Non-HDL Cholesterol (Calc): 58 mg/dL (calc) (ref <130)
Total CHOL/HDL Ratio: 2.2 (calc) (ref <5.0)
Triglycerides: 89 mg/dL (ref <150)

## 2019-02-07 LAB — HEMOGLOBIN A1C
Hgb A1c MFr Bld: 5.4 % of total Hgb (ref <5.7)
Mean Plasma Glucose: 108 (calc)
eAG (mmol/L): 6 (calc)

## 2019-02-07 LAB — VITAMIN D 25 HYDROXY (VIT D DEFICIENCY, FRACTURES): Vit D, 25-Hydroxy: 35 ng/mL (ref 30–100)

## 2019-02-07 LAB — URIC ACID: Uric Acid, Serum: 6.7 mg/dL (ref 2.5–7.0)

## 2019-02-23 ENCOUNTER — Other Ambulatory Visit: Payer: Self-pay | Admitting: Family Medicine

## 2019-02-23 DIAGNOSIS — E782 Mixed hyperlipidemia: Secondary | ICD-10-CM

## 2019-02-23 MED ORDER — ATORVASTATIN CALCIUM 10 MG PO TABS: ORAL_TABLET | ORAL | 1 refills | Status: DC

## 2019-02-23 NOTE — Telephone Encounter (Signed)
atorvastatin (LIPITOR) 10 MG tablet    Patient is requesting refill.    Pharmacy:  [id number] - [place], [name] [phone] (Phone) [phone] (Fax)

## 2019-02-25 ENCOUNTER — Telehealth: Payer: Self-pay | Admitting: Family Medicine

## 2019-02-25 NOTE — Telephone Encounter (Signed)
Pt callled asking if she can get an order for a sit down walker.  She has the old type and would like a new one.  She wants the order to be written so insurance would pay for it  CB#  [phone]

## 2019-02-26 NOTE — Telephone Encounter (Signed)
appt scheduled for 12.8.2020

## 2019-03-03 ENCOUNTER — Other Ambulatory Visit: Payer: Self-pay

## 2019-03-03 ENCOUNTER — Ambulatory Visit (INDEPENDENT_AMBULATORY_CARE_PROVIDER_SITE_OTHER): Payer: Medicare Other | Admitting: Family Medicine

## 2019-03-03 ENCOUNTER — Encounter: Payer: Self-pay | Admitting: Family Medicine

## 2019-03-03 ENCOUNTER — Telehealth: Payer: Self-pay

## 2019-03-03 VITALS — BP 110/75 | Ht 63.0 in | Wt 233.0 lb

## 2019-03-03 DIAGNOSIS — R269 Unspecified abnormalities of gait and mobility: Secondary | ICD-10-CM | POA: Diagnosis not present

## 2019-03-03 DIAGNOSIS — R29898 Other symptoms and signs involving the musculoskeletal system: Secondary | ICD-10-CM | POA: Diagnosis not present

## 2019-03-03 DIAGNOSIS — M810 Age-related osteoporosis without current pathological fracture: Secondary | ICD-10-CM | POA: Diagnosis not present

## 2019-03-03 DIAGNOSIS — Z7409 Other reduced mobility: Secondary | ICD-10-CM | POA: Insufficient documentation

## 2019-03-03 DIAGNOSIS — Z9181 History of falling: Secondary | ICD-10-CM

## 2019-03-03 NOTE — Progress Notes (Signed)
Name: Tracy Bolton   MRN: [id number]    DOB: 22-Jul-1940   Date:03/03/2019       Progress Note  Subjective:    Chief Complaint  Chief Complaint  Patient presents with  . Gait Problem    needs Rx for a rolling walker to [facility]    I connected with  [name]  on 03/03/19 at 11:00 AM EST by a video enabled telemedicine application and verified that I am speaking with the correct person using two identifiers.  I discussed the limitations of evaluation and management by telemedicine and the availability of in person appointments. The patient expressed understanding and agreed to proceed. Staff also discussed with the patient that there may be a patient responsible charge related to this service. Patient Location: home Provider Location: [facility] clinic Additional Individuals present: none  HPI   Patient is relatively new to me I have done a virtual encounter with her recently for following up on her blood pressure, she does present today for a virtual encounter so that she can get a replacement walker she would like a rolling walker with a seat.  She has past medical history of critical illness, prolonged hospitalization, in 2015 have profound weakness and change in gait after that point when she was discharged and with physical therapy from 2015-2018 she has increased her strength but her gait continues to be abnormal, she is only able to take small staggered steps, she is not steady without something to hold onto.  And she is weak and tired and cannot walk long distances.  She states she is unable to go down the aisle in a grocery store without stopping to rest.  Her rolling walker at home has been very helpful and effective for her to get around her home but it is in poor condition now being 78 years old. She is not had any change to her gait she denies any new or changed weakness or numbness.  She has not had any falls in the past year. Pt 2015 pt first got a regular rolling walker  after illness with decreased strength and limited ability to ambulate  She still has the walker but it is old and she would like a new one that is higher and lets her also sit and rest.    2015 2018 she did PT Her strength and mobility are still limited she cannot walk even short distances without the walker and she gets tired.  Basically cannot go to the grocery store with without help.    No recent falls Fall Risk: Fall Risk  03/03/2019 03/03/2019 01/23/2019 07/24/2018 06/10/2018  Falls in the past year? 0 0 0 0 0  Comment - - - - -  Number falls in past yr: 0 0 0 0 0  Comment - - - - -  Injury with Fall? 0 0 0 0 0  Risk for fall due to : Impaired mobility;Impaired balance/gait;Orthopedic patient - - - -  Follow up Education provided;Falls prevention discussed;Falls evaluation completed - - Falls evaluation completed Falls prevention discussed    Functional Status Survey: Is the patient deaf or have difficulty hearing?: No Does the patient have difficulty seeing, even when wearing glasses/contacts?: No Does the patient have difficulty concentrating, remembering, or making decisions?: No Does the patient have difficulty walking or climbing stairs?: Yes Does the patient have difficulty dressing or bathing?: No Does the patient have difficulty doing errands alone such as visiting a doctor's office or  shopping?: Yes  Patient does have mobility limitations that significantly impairs her ability to participate in her activities of daily living in the home including high risk of morbidity and/or mortality secondary to high fall risk and osteoporosis with high hip fracture score, patient is also able to safely use a walker in her home   Patient Active Problem List   Diagnosis Date Noted  . Urine test positive for microalbuminuria 06/06/2017  . Full code status 05/21/2017  . Preventative health care 05/21/2017  . Elevated serum glutamic pyruvic transaminase (SGPT) level 05/19/2016  . Callus  of foot 05/18/2016  . Urine abnormality 11/14/2015  . Encounter for medication monitoring 11/14/2015  . Hyperlipidemia 11/14/2015  . Pre-diabetes 05/18/2015  . OP (osteoporosis) 09/06/2014  . Chronic gouty arthritis 09/06/2014  . Obesity, Class III, BMI 40-49.9 (morbid obesity) ([place]) 09/06/2014  . 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 Hypertension goal BP (blood pressure) < 140/90 07/12/2014  . Weakness of both lower extremities 07/12/2014  . Dry skin 07/12/2014  . Right-sided uninodular goiter 07/12/2014  . Cyst of eye 07/12/2014  . Constipation, chronic 07/12/2014  . Subclinical hypothyroidism 02/06/2013    Social History   Tobacco Use  . Smoking status: Never Smoker  . Smokeless tobacco: Never Used  Substance Use Topics  . Alcohol use: No    Alcohol/week: 0.0 standard drinks     Current Outpatient Medications:  .  alendronate (FOSAMAX) 70 MG tablet, TAKE 1 TABLET BY MOUTH ONCE A WEEK TAKE WITH FULL GLASS OF WATER ON EMPTY STOMACH, Disp: 12 tablet, Rfl: 1 .  allopurinol (ZYLOPRIM) 100 MG tablet, Take 1 tablet (100 mg total) by mouth daily., Disp: 90 tablet, Rfl: 1 .  amLODipine (NORVASC) 10 MG tablet, Take 1 tablet (10 mg total) by mouth daily., Disp: 90 tablet, Rfl: 1 .  aspirin 81 MG tablet, Take 81 mg by mouth., Disp: , Rfl:  .  atenolol (TENORMIN) 50 MG tablet, Take 1 tablet (50 mg total) by mouth daily., Disp: 90 tablet, Rfl: 1 .  atorvastatin (LIPITOR) 10 MG tablet, TAKE 1 TABLET BY MOUTH EVERYDAY AT BEDTIME, Disp: 90 tablet, Rfl: 1 .  [facility] SENNA 8.6 MG tablet, TAKE 1 TABLET (8.6 MG TOTAL) BY MOUTH 2 (TWO) TIMES DAILY AS NEEDED FOR CONSTIPATION., Disp: 180 tablet, Rfl: 1 .  metFORMIN (GLUCOPHAGE-XR) 500 MG 24 hr tablet, Take 2 tablets (1,000 mg total) by mouth daily., Disp: 180 tablet, Rfl: 1 .  Multiple Vitamin (MULTI-VITAMINS) TABS, Take by mouth., Disp: , Rfl:  .  nystatin (MYCOSTATIN/NYSTOP) powder, APPLY TO AFFECTED AREA OF SKIN 3X A DAY AS NEEDED IN  A SUFFICIENT AMOUNT, Disp: 30 g, Rfl: 2  No Known Allergies  I personally reviewed active problem list, medication list, allergies, family history, social history, health maintenance, notes from last encounter, lab results, imaging with the patient/caregiver today.  Review of Systems  Constitutional: Negative.   HENT: Negative.   Eyes: Negative.   Respiratory: Negative.   Cardiovascular: Negative.   Gastrointestinal: Negative.   Endocrine: Negative.   Genitourinary: Negative.   Musculoskeletal: Negative.   Skin: Negative.   Allergic/Immunologic: Negative.   Neurological: Negative.   Hematological: Negative.   Psychiatric/Behavioral: Negative.   All other systems reviewed and are negative.    Objective:   Virtual encounter, vitals limited, only able to obtain the following Today's Vitals   03/03/19 1131  BP: 110/75  Weight: 233 lb (105.7 kg)  Height: 5\' 3"  (1.6 m)   Body mass  index is 41.27 kg/m. Nursing Note and Vital Signs reviewed.  Physical Exam Vitals signs and nursing note reviewed.  Constitutional:      General: She is not in acute distress.    Appearance: She is well-developed. She is obese. She is not ill-appearing, toxic-appearing or diaphoretic.  HENT:     Head: Normocephalic and atraumatic.     Nose: Nose normal.  Eyes:     General:        Right eye: No discharge.        Left eye: No discharge.     Conjunctiva/sclera: Conjunctivae normal.  Neck:     Trachea: No tracheal deviation.  Pulmonary:     Effort: No respiratory distress.     Breath sounds: No stridor.     Comments: tachypneic with walking across the room, normal respirations when at rest talking, no audible wheeze, stridor, no retractions or accessory muscle use Skin:    Coloration: Skin is not jaundiced or pale.     Findings: No rash.  Neurological:     Mental Status: She is alert.     Motor: No abnormal muscle tone.     Coordination: Coordination normal.     Gait: Gait abnormal.      Comments: Slow unsteady gait with short steps, she has to hold onto things in her house to walk across the room and show me her mobility without the walker  Psychiatric:        Mood and Affect: Mood normal.        Behavior: Behavior normal.     PE limited by telephone encounter  No results found for this or any previous visit (from the past 72 hour(s)).  Assessment and Plan:     ICD-10-CM   1. Abnormality of gait and mobility  R26.9 PR DME SUPPLY OR ACCESSORY, NOS   short unsteady steps appears to be slightly unstable, ataxic, and antalgic  2. Risk for falls  Z91.81 PR DME SUPPLY OR ACCESSORY, NOS   right risk with gait, morbid obesity, osteopenia  3. Weakness of both lower extremities  R29.898 PR DME SUPPLY OR ACCESSORY, NOS  4. Age-related osteoporosis without current pathological fracture  M81.0 PR DME SUPPLY OR ACCESSORY, NOS  5. Limited mobility  Z74.09 PR DME SUPPLY OR ACCESSORY, NOS  6. Obesity, Class III, BMI 40-49.9 (morbid obesity) ([facility])  E66.01 PR DME SUPPLY OR ACCESSORY, NOS    DME orders for rolling walker with seat - walker is medically necessary, discussed with pt that if she wants very specific devices they may not be covered by medicare.  Encouraged her to call and inquire about coverage and prices.   Pt has chronic and stable gait disturbances since prolonged illness in 2015, no changes or concerns, she remains at high risk for falls, discussed preventative measures.     - I discussed the assessment and treatment plan with the patient. The patient was provided an opportunity to ask questions and all were answered. The patient agreed with the plan and demonstrated an understanding of the instructions.  I provided 18 minutes of non-face-to-face time during this encounter. Greater than 50% of this visit was spent in direct face-to-face counseling, obtaining history and physical, discussing and educating pt on treatment plan.  Total time of this visit was 25.  Remainder  of time involved but was not limited to reviewing chart (recent and pertinent OV notes and labs), documentation in EMR, and coordinating care and treatment plan.   Delsa Grana,  PA-C 03/03/19 12:20 PM

## 2019-03-03 NOTE — Progress Notes (Deleted)
Name: Tracy Bolton   MRN: [id number]    DOB: May 10, 1940   Date:03/03/2019       Progress Note  Chief Complaint  Patient presents with   Gait Problem    needs Rx for a rolling walker to [facility]     Subjective:   Tracy Bolton is a 78 y.o. female, presents to clinic for routine follow up on the conditions listed above.  ***  Patient Active Problem List   Diagnosis Date Noted   Limited mobility 03/03/2019   Abnormality of gait and mobility 03/03/2019   Risk for falls 03/03/2019   Urine test positive for microalbuminuria 06/06/2017   Full code status 05/21/2017   Preventative health care 05/21/2017   Elevated serum glutamic pyruvic transaminase (SGPT) level 05/19/2016   Callus of foot 05/18/2016   Urine abnormality 11/14/2015   Encounter for medication monitoring 11/14/2015   Hyperlipidemia 11/14/2015   Pre-diabetes 05/18/2015   OP (osteoporosis) 09/06/2014   Chronic gouty arthritis 09/06/2014   Obesity, Class III, BMI 40-49.9 (morbid obesity) ([place]) 09/06/2014   Idiopathic chronic gout without tophus 07/12/2014   Hypertension goal BP (blood pressure) < 140/90 07/12/2014   Weakness of both lower extremities 07/12/2014   Dry skin 07/12/2014   Right-sided uninodular goiter 07/12/2014   Cyst of eye 07/12/2014   Constipation, chronic 07/12/2014   Subclinical hypothyroidism 02/06/2013    Past Surgical History:  Procedure Laterality Date   CATARACT EXTRACTION, BILATERAL  02/2018   COLONOSCOPY  2016   LUNG SURGERY Right 2004   biopsy normal    Family History  Problem Relation Age of Onset   Bone cancer Father 62   Thyroid disease Sister    Cancer Mother 60       brain tumor   Breast cancer Neg Hx     Social History   Socioeconomic History   Marital status: Single    Spouse name: Not on file   Number of children: 2   Years of education: Not on file   Highest education level: Some college, no degree  Occupational  History   Occupation: retired  Scientist, product/process development strain: Not hard at [employer] insecurity    Worry: Never true    Inability: Never true   Transportation needs    Medical: No    Non-medical: No  Tobacco Use   Smoking status: Never Smoker   Smokeless tobacco: Never Used  Substance and Sexual Activity   Alcohol use: No    Alcohol/week: 0.0 standard drinks   Drug use: No   Sexual activity: Not Currently  Lifestyle   Physical activity    Days per week: 7 days    Minutes per session: 20 min   Stress: Not at all  Relationships   Social connections    Talks on phone: More than three times a week    Gets together: More than three times a week    Attends religious service: More than 4 times per year    Active member of club or organization: No    Attends meetings of clubs or organizations: Never    Relationship status: Divorced   Intimate partner violence    Fear of current or ex partner: No    Emotionally abused: No    Physically abused: No    Forced sexual activity: No  Other Topics Concern   Not on file  Social History Narrative   Not on file  Current Outpatient Medications:    alendronate (FOSAMAX) 70 MG tablet, TAKE 1 TABLET BY MOUTH ONCE A WEEK TAKE WITH FULL GLASS OF WATER ON EMPTY STOMACH, Disp: 12 tablet, Rfl: 1   allopurinol (ZYLOPRIM) 100 MG tablet, Take 1 tablet (100 mg total) by mouth daily., Disp: 90 tablet, Rfl: 1   amLODipine (NORVASC) 10 MG tablet, Take 1 tablet (10 mg total) by mouth daily., Disp: 90 tablet, Rfl: 1   aspirin 81 MG tablet, Take 81 mg by mouth., Disp: , Rfl:    atenolol (TENORMIN) 50 MG tablet, Take 1 tablet (50 mg total) by mouth daily., Disp: 90 tablet, Rfl: 1   atorvastatin (LIPITOR) 10 MG tablet, TAKE 1 TABLET BY MOUTH EVERYDAY AT BEDTIME, Disp: 90 tablet, Rfl: 1   [employer] SENNA 8.6 MG tablet, TAKE 1 TABLET (8.6 MG TOTAL) BY MOUTH 2 (TWO) TIMES DAILY AS NEEDED FOR CONSTIPATION., Disp: 180 tablet, Rfl:  1   metFORMIN (GLUCOPHAGE-XR) 500 MG 24 hr tablet, Take 2 tablets (1,000 mg total) by mouth daily., Disp: 180 tablet, Rfl: 1   Multiple Vitamin (MULTI-VITAMINS) TABS, Take by mouth., Disp: , Rfl:    nystatin (MYCOSTATIN/NYSTOP) powder, APPLY TO AFFECTED AREA OF SKIN 3X A DAY AS NEEDED IN A SUFFICIENT AMOUNT, Disp: 30 g, Rfl: 2  No Known Allergies  I personally reviewed {Reviewed:[id number]} with the patient/caregiver today.  Review of Systems   Objective:    Vitals:   03/03/19 1131  BP: 110/75  Weight: 233 lb (105.7 kg)  Height: 5\' 3"  (1.6 m)    Body mass index is 41.27 kg/m.  Physical Exam   Recent Results (from the past 2160 hour(s))  CBC with Differential/Platelet     Status: Abnormal   Collection Time: 02/06/19 12:00 AM  Result Value Ref Range   WBC 12.2 (H) 3.8 - 10.8 Thousand/uL   RBC 4.69 3.80 - 5.10 Million/uL   Hemoglobin 12.7 11.7 - 15.5 g/dL   HCT 39.9 35.0 - 45.0 %   MCV 85.1 80.0 - 100.0 fL   MCH 27.1 27.0 - 33.0 pg   MCHC 31.8 (L) 32.0 - 36.0 g/dL   RDW 12.6 11.0 - 15.0 %   Platelets 236 140 - 400 Thousand/uL   MPV 11.3 7.5 - 12.5 fL   Neutro Abs 6,795 1,500 - 7,800 cells/uL   Lymphs Abs 4,075 (H) 850 - 3,900 cells/uL   Absolute Monocytes 952 (H) 200 - 950 cells/uL   Eosinophils Absolute 317 15 - 500 cells/uL   Basophils Absolute 61 0 - 200 cells/uL   Neutrophils Relative % 55.7 %   Total Lymphocyte 33.4 %   Monocytes Relative 7.8 %   Eosinophils Relative 2.6 %   Basophils Relative 0.5 %  COMPLETE METABOLIC PANEL WITH GFR     Status: Abnormal   Collection Time: 02/06/19 12:00 AM  Result Value Ref Range   Glucose, Bld 128 (H) 65 - 99 mg/dL    Comment: .            Fasting reference interval . For someone without known diabetes, a glucose value >125 mg/dL indicates that they may have diabetes and this should be confirmed with a follow-up test. .    BUN 15 7 - 25 mg/dL   Creat 1.01 (H) 0.60 - 0.93 mg/dL    Comment: For patients >49 years of  age, the reference limit for Creatinine is approximately 13% higher for people identified as African-American. .    GFR, Est Non African American 54 (  L) > OR = 60 mL/min/1.84m2   GFR, Est African American 63 > OR = 60 mL/min/1.84m2   BUN/Creatinine Ratio 15 6 - 22 (calc)   Sodium 141 135 - 146 mmol/L   Potassium 4.1 3.5 - 5.3 mmol/L   Chloride 103 98 - 110 mmol/L   CO2 25 20 - 32 mmol/L   Calcium 9.9 8.6 - 10.4 mg/dL   Total Protein 7.6 6.1 - 8.1 g/dL   Albumin 4.2 3.6 - 5.1 g/dL   Globulin 3.4 1.9 - 3.7 g/dL (calc)   AG Ratio 1.2 1.0 - 2.5 (calc)   Total Bilirubin 0.6 0.2 - 1.2 mg/dL   Alkaline phosphatase (APISO) 53 37 - 153 U/L   AST 18 10 - 35 U/L   ALT 14 6 - 29 U/L  Lipid panel     Status: Abnormal   Collection Time: 02/06/19 12:00 AM  Result Value Ref Range   Cholesterol 105 <200 mg/dL   HDL 47 (L) > OR = 50 mg/dL   Triglycerides 89 <150 mg/dL   LDL Cholesterol (Calc) 41 mg/dL (calc)    Comment: Reference range: <100 . Desirable range <100 mg/dL for primary prevention;   <70 mg/dL for patients with CHD or diabetic patients  with > or = 2 CHD risk factors. Marland Kitchen LDL-C is now calculated using the Martin-Hopkins  calculation, which is a validated novel method providing  better accuracy than the Friedewald equation in the  estimation of LDL-C.  Cresenciano Genre et al. Annamaria Helling. [id number]): 2061-2068  (http://education.[facility].com/faq/FAQ164)    Total CHOL/HDL Ratio 2.2 <5.0 (calc)   Non-HDL Cholesterol (Calc) 58 <130 mg/dL (calc)    Comment: For patients with diabetes plus 1 major ASCVD risk  factor, treating to a non-HDL-C goal of <100 mg/dL  (LDL-C of <70 mg/dL) is considered a therapeutic  option.   Hemoglobin A1c     Status: None   Collection Time: 02/06/19 12:00 AM  Result Value Ref Range   Hgb A1c MFr Bld 5.4 <5.7 % of total Hgb    Comment: For the purpose of screening for the presence of diabetes: . <5.7%       Consistent with the absence of  diabetes 5.7-6.4%    Consistent with increased risk for diabetes             (prediabetes) > or =6.5%  Consistent with diabetes . This assay result is consistent with a decreased risk of diabetes. . Currently, no consensus exists regarding use of hemoglobin A1c for diagnosis of diabetes in children. . According to American Diabetes Association (ADA) guidelines, hemoglobin A1c <7.0% represents optimal control in non-pregnant diabetic patients. Different metrics may apply to specific patient populations.  Standards of Medical Care in Diabetes(ADA). .    Mean Plasma Glucose 108 (calc)   eAG (mmol/L) 6.0 (calc)  Uric acid     Status: None   Collection Time: 02/06/19 12:00 AM  Result Value Ref Range   Uric Acid, Serum 6.7 2.5 - 7.0 mg/dL    Comment: Therapeutic target for gout patients: <6.0 mg/dL .   VITAMIN D 25 Hydroxy (Vit-D Deficiency, Fractures)     Status: None   Collection Time: 02/06/19 12:00 AM  Result Value Ref Range   Vit D, 25-Hydroxy 35 30 - 100 ng/mL    Comment: Vitamin D Status         25-OH Vitamin D: . Deficiency:                    <  20 ng/mL Insufficiency:             20 - 29 ng/mL Optimal:                 > or = 30 ng/mL . For 25-OH Vitamin D testing on patients on  D2-supplementation and patients for whom quantitation  of D2 and D3 fractions is required, the [facility]ureD(TM) 25-OH VIT D, (D2,D3), LC/MS/MS is recommended: order  code [id number] (patients >24yrs). See Note 1 . Note 1 . For additional information, please refer to  http://education.[facility].com/faq/FAQ199  (This link is being provided for informational/ educational purposes only.)     Diabetic Foot Exam: Diabetic Foot Exam - Simple   No data filed       PHQ2/9: Depression screen [facility] 2/9 03/03/2019 01/23/2019 07/24/2018 06/10/2018 12/16/2017  Decreased Interest 0 0 0 0 0  Down, Depressed, Hopeless 0 0 0 0 0  PHQ - 2 Score 0 0 0 0 0  Altered sleeping 0 0 0 0 0  Tired,  decreased energy 0 0 0 0 0  Change in appetite 0 0 0 0 0  Feeling bad or failure about yourself  0 0 0 0 0  Trouble concentrating 0 0 0 0 0  Moving slowly or fidgety/restless 0 0 0 0 0  Suicidal thoughts 0 0 0 0 0  PHQ-9 Score 0 0 0 0 0  Difficult doing work/chores Not difficult at all Not difficult at all Not difficult at all Not difficult at all -    phq 9 is {gen pos [id number] ***  Fall Risk: Fall Risk  03/03/2019 03/03/2019 01/23/2019 07/24/2018 06/10/2018  Falls in the past year? 0 0 0 0 0  Comment - - - - -  Number falls in past yr: 0 0 0 0 0  Comment - - - - -  Injury with Fall? 0 0 0 0 0  Risk for fall due to : Impaired mobility;Impaired balance/gait;Orthopedic patient - - - -  Follow up Education provided;Falls prevention discussed;Falls evaluation completed - - Falls evaluation completed Falls prevention discussed      Functional Status Survey: Is the patient deaf or have difficulty hearing?: No Does the patient have difficulty seeing, even when wearing glasses/contacts?: No Does the patient have difficulty concentrating, remembering, or making decisions?: No Does the patient have difficulty walking or climbing stairs?: Yes Does the patient have difficulty dressing or bathing?: No Does the patient have difficulty doing errands alone such as visiting a doctor's office or shopping?: Yes    Assessment & Plan:   Problem List Items Addressed This Visit      Nervous and Auditory   Weakness of both lower extremities     Musculoskeletal and Integument   OP (osteoporosis)     Other   Obesity, Class III, BMI 40-49.9 (morbid obesity) ([place])   Limited mobility   Abnormality of gait and mobility - Primary   Risk for falls       No follow-ups on file.   Delsa Grana, PA-C 03/03/19 12:51 PM

## 2019-03-03 NOTE — Telephone Encounter (Signed)
P  Copied from [id number]. Topic: General - Other >> Mar 03, 2019  2:07 PM Reyne Dumas L wrote: Reason for CRM:   Pt wants to make sure that the walker that is ordered for her is the one with poles where her hands are up in the air. Pt can be reached at [phone].

## 2019-03-04 ENCOUNTER — Telehealth: Payer: Self-pay

## 2019-03-04 DIAGNOSIS — R29898 Other symptoms and signs involving the musculoskeletal system: Secondary | ICD-10-CM

## 2019-03-04 NOTE — Telephone Encounter (Signed)
Pt notified, will call and try to see what walker she wants

## 2019-03-04 NOTE — Telephone Encounter (Signed)
Up right walker that folds [facility] in [place]  P # [phone]

## 2019-03-10 ENCOUNTER — Other Ambulatory Visit: Payer: Self-pay

## 2019-03-10 DIAGNOSIS — M810 Age-related osteoporosis without current pathological fracture: Secondary | ICD-10-CM

## 2019-03-10 MED ORDER — ALENDRONATE SODIUM 70 MG PO TABS: ORAL_TABLET | ORAL | 1 refills | Status: DC

## 2019-03-23 ENCOUNTER — Other Ambulatory Visit: Payer: Self-pay

## 2019-03-23 DIAGNOSIS — R7303 Prediabetes: Secondary | ICD-10-CM

## 2019-03-23 DIAGNOSIS — M1A00X0 Idiopathic chronic gout, unspecified site, without tophus (tophi): Secondary | ICD-10-CM

## 2019-03-23 DIAGNOSIS — I10 Essential (primary) hypertension: Secondary | ICD-10-CM

## 2019-03-23 MED ORDER — ALLOPURINOL 100 MG PO TABS: 100.0000 mg | ORAL_TABLET | Freq: Every day | ORAL | 1 refills | Status: DC

## 2019-03-23 MED ORDER — ATENOLOL 50 MG PO TABS: 50.0000 mg | ORAL_TABLET | Freq: Every day | ORAL | 1 refills | Status: DC

## 2019-03-23 MED ORDER — AMLODIPINE BESYLATE 10 MG PO TABS: 10.0000 mg | ORAL_TABLET | Freq: Every day | ORAL | 1 refills | Status: DC

## 2019-03-23 MED ORDER — METFORMIN HCL ER 500 MG PO TB24: 1000.0000 mg | ORAL_TABLET | Freq: Every day | ORAL | 1 refills | Status: DC

## 2019-04-29 ENCOUNTER — Other Ambulatory Visit: Payer: Self-pay | Admitting: Family Medicine

## 2019-06-03 ENCOUNTER — Other Ambulatory Visit: Payer: Self-pay

## 2019-06-03 ENCOUNTER — Ambulatory Visit: Payer: Medicare Other | Admitting: Family Medicine

## 2019-06-03 ENCOUNTER — Encounter: Payer: Self-pay | Admitting: Family Medicine

## 2019-06-03 VITALS — BP 114/78 | HR 61 | Temp 98.2°F | Resp 14 | Ht 63.0 in | Wt 226.5 lb

## 2019-06-03 DIAGNOSIS — R7303 Prediabetes: Secondary | ICD-10-CM

## 2019-06-03 DIAGNOSIS — Z23 Encounter for immunization: Secondary | ICD-10-CM

## 2019-06-03 DIAGNOSIS — E782 Mixed hyperlipidemia: Secondary | ICD-10-CM

## 2019-06-03 DIAGNOSIS — R269 Unspecified abnormalities of gait and mobility: Secondary | ICD-10-CM

## 2019-06-03 DIAGNOSIS — M1A00X0 Idiopathic chronic gout, unspecified site, without tophus (tophi): Secondary | ICD-10-CM

## 2019-06-03 DIAGNOSIS — M810 Age-related osteoporosis without current pathological fracture: Secondary | ICD-10-CM

## 2019-06-03 DIAGNOSIS — E66813 Obesity, class 3: Secondary | ICD-10-CM

## 2019-06-03 DIAGNOSIS — I10 Essential (primary) hypertension: Secondary | ICD-10-CM | POA: Diagnosis not present

## 2019-06-03 DIAGNOSIS — Z7409 Other reduced mobility: Secondary | ICD-10-CM

## 2019-06-03 DIAGNOSIS — Z5181 Encounter for therapeutic drug level monitoring: Secondary | ICD-10-CM

## 2019-06-03 DIAGNOSIS — E039 Hypothyroidism, unspecified: Secondary | ICD-10-CM

## 2019-06-03 DIAGNOSIS — E041 Nontoxic single thyroid nodule: Secondary | ICD-10-CM

## 2019-06-03 DIAGNOSIS — Z9181 History of falling: Secondary | ICD-10-CM

## 2019-06-03 MED ORDER — ZOSTER VAC RECOMB ADJUVANTED 50 MCG/0.5ML IM SUSR: 0.5000 mL | Freq: Once | INTRAMUSCULAR | 1 refills | Status: AC

## 2019-06-03 NOTE — Patient Instructions (Signed)

## 2019-06-03 NOTE — Progress Notes (Signed)
Name: Tracy Bolton   MRN: [id number]    DOB: 02-08-1941   Date:06/03/2019       Progress Note  Chief Complaint  Patient presents with  . Follow-up  . Hypertension  . Hyperlipidemia  . Gout     Subjective:   Tracy Bolton is a 79 y.o. female, presents to clinic for routine follow up on the conditions listed above.  Hypertension:  Pt diagnosed with HTN 6 years Currently managed on atenolol and amlodipine Pt reports good med compliance and denies any SE.  No lightheadedness, hypotension, syncope. Blood pressure today is well controlled. BP Readings from Last 3 Encounters:  06/03/19 114/78  03/03/19 110/75  07/24/18 130/70   Pt denies CP, SOB, exertional sx, LE edema, palpitation, Ha's, visual disturbances  Hyperlipidemia: Current Medication Regimen:  lipitor 10 mg daily Last Lipids: Lab Results  Component Value Date   CHOL 105 02/06/2019   HDL 47 (L) 02/06/2019   LDLCALC 41 02/06/2019   TRIG 89 02/06/2019   CHOLHDL 2.2 02/06/2019    - Denies: Chest pain, shortness of breath, myalgias. - Documented aortic atherosclerosis?  - Risk factors for atherosclerosis: hypercholesterolemia and hypertension  Shingles shot - she would like to get if she is eligible, we discussed the vaccines and encouraged her to follow-up with her insurance company and check with her pharmacy I will send him to the pharmacy  She is waiting on the COVID vaccine   Osteoporosis started fosamax 12/2014, she believes she will be done with med tx soon, did review her last bone density scan which was March 2019 should be due this month for follow-up DEXA scan.  She does have abnormal gait and difficulty with her ambulation and we would want a follow this to recheck her bone density and fall risk.  Gout - Uric acid still above goal 6.7, improved from last year 7.7 same dose of allopurinol 100 but she improved her diet and she believes this has helped control her gout flares  Right sided goiter and  hypothyroid Dr. Loney Loh -she states they are not going to do surgery on it unless it starting to interfere with her airway, they monitor labs on it occasionally but she is not on any thyroid medications  Patient Active Problem List   Diagnosis Date Noted  . Limited mobility 03/03/2019  . Abnormality of gait and mobility 03/03/2019  . Risk for falls 03/03/2019  . Urine test positive for microalbuminuria 06/06/2017  . Full code status 05/21/2017  . Preventative health care 05/21/2017  . Elevated serum glutamic pyruvic transaminase (SGPT) level 05/19/2016  . Callus of foot 05/18/2016  . Urine abnormality 11/14/2015  . Encounter for medication monitoring 11/14/2015  . Hyperlipidemia 11/14/2015  . Pre-diabetes 05/18/2015  . OP (osteoporosis) 09/06/2014  . Chronic gouty arthritis 09/06/2014  . Obesity, Class III, BMI 40-49.9 (morbid obesity) ([place]) 09/06/2014  . 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 Hypertension goal BP (blood pressure) < 140/90 07/12/2014  . Weakness of both lower extremities 07/12/2014  . Dry skin 07/12/2014  . Right-sided uninodular goiter 07/12/2014  . Cyst of eye 07/12/2014  . Constipation, chronic 07/12/2014  . Subclinical hypothyroidism 02/06/2013    Past Surgical History:  Procedure Laterality Date  . CATARACT EXTRACTION, BILATERAL  02/2018  . COLONOSCOPY  2016  . LUNG SURGERY Right 2004   biopsy normal    Family History  Problem Relation Age of Onset  . Bone cancer Father 47  . Thyroid disease  Sister   . Cancer Mother 35       brain tumor  . Breast cancer Neg Hx     Social History   Tobacco Use  . Smoking status: Never Smoker  . Smokeless tobacco: Never Used  Substance Use Topics  . Alcohol use: No    Alcohol/week: 0.0 standard drinks  . Drug use: No      Current Outpatient Medications:  .  alendronate (FOSAMAX) 70 MG tablet, TAKE 1 TABLET BY MOUTH ONCE A WEEK TAKE WITH FULL GLASS OF WATER ON EMPTY STOMACH, Disp: 12 tablet,  Rfl: 1 .  allopurinol (ZYLOPRIM) 100 MG tablet, Take 1 tablet (100 mg total) by mouth daily., Disp: 90 tablet, Rfl: 1 .  amLODipine (NORVASC) 10 MG tablet, Take 1 tablet (10 mg total) by mouth daily., Disp: 90 tablet, Rfl: 1 .  aspirin 81 MG tablet, Take 81 mg by mouth., Disp: , Rfl:  .  atenolol (TENORMIN) 50 MG tablet, Take 1 tablet (50 mg total) by mouth daily., Disp: 90 tablet, Rfl: 1 .  atorvastatin (LIPITOR) 10 MG tablet, TAKE 1 TABLET BY MOUTH EVERYDAY AT BEDTIME, Disp: 90 tablet, Rfl: 1 .  CVS SENNA 8.6 MG tablet, TAKE 1 TABLET (8.6 MG TOTAL) BY MOUTH 2 (TWO) TIMES DAILY AS NEEDED FOR CONSTIPATION., Disp: 180 tablet, Rfl: 1 .  metFORMIN (GLUCOPHAGE-XR) 500 MG 24 hr tablet, Take 2 tablets (1,000 mg total) by mouth daily., Disp: 180 tablet, Rfl: 1 .  Multiple Vitamin (MULTI-VITAMINS) TABS, Take by mouth., Disp: , Rfl:  .  nystatin (MYCOSTATIN/NYSTOP) powder, APPLY TO AFFECTED AREA OF SKIN 3X A DAY AS NEEDED IN A SUFFICIENT AMOUNT, Disp: 30 g, Rfl: 2  No Known Allergies  Chart Review Today: I personally reviewed active problem list, medication list, allergies, family history, social history, health maintenance, notes from last encounter, lab results, imaging with the patient/caregiver today.  Review of Systems  10 Systems reviewed and are negative for acute change except as noted in the HPI.  Objective:    Vitals:   06/03/19 1106  BP: 114/78  Pulse: 61  Resp: 14  Temp: 98.2 F (36.8 C)  SpO2: 95%  Weight: 226 lb 8 oz (102.7 kg)  Height: 5\' 3"  (1.6 m)    Body mass index is 40.12 kg/m.  Physical Exam Vitals and nursing note reviewed.  Constitutional:      General: She is not in acute distress.    Appearance: Normal appearance. She is well-developed. She is obese. She is not ill-appearing, toxic-appearing or diaphoretic.     Interventions: Face mask in place.     Comments: Morbidly obese, chronically ill-appearing woman, no acute distress, very pleasant  HENT:     Head:  Normocephalic and atraumatic.     Right Ear: External ear normal.     Left Ear: External ear normal.  Eyes:     General: Lids are normal. No scleral icterus.       Right eye: No discharge.        Left eye: No discharge.     Conjunctiva/sclera: Conjunctivae normal.  Neck:     Thyroid: Thyromegaly present.     Trachea: Phonation normal. No tracheal deviation.     Comments: Large right-sided goiter Cardiovascular:     Rate and Rhythm: Normal rate and regular rhythm.     Pulses: Normal pulses.          Radial pulses are 2+ on the right side and 2+ on the left side.  Posterior tibial pulses are 2+ on the right side and 2+ on the left side.     Heart sounds: Normal heart sounds. No murmur. No friction rub. No gallop.   Pulmonary:     Effort: Pulmonary effort is normal. No respiratory distress.     Breath sounds: Normal breath sounds. No stridor. No wheezing, rhonchi or rales.  Chest:     Chest wall: No tenderness.  Abdominal:     General: Bowel sounds are normal. There is no distension.     Palpations: Abdomen is soft.     Tenderness: There is no abdominal tenderness. There is no guarding or rebound.     Comments: Obese abdomen soft nontender nondistended normal bowel sounds x4  Musculoskeletal:        General: No deformity. Normal range of motion.     Cervical back: Normal range of motion and neck supple.     Right lower leg: No edema.     Left lower leg: No edema.  Skin:    General: Skin is warm and dry.     Capillary Refill: Capillary refill takes less than 2 seconds.     Coloration: Skin is not jaundiced or pale.     Findings: No rash.  Neurological:     Mental Status: She is alert.     Motor: No abnormal muscle tone.     Gait: Gait abnormal.     Comments: Unsteady gait walks with a walker  Psychiatric:        Speech: Speech normal.        Behavior: Behavior normal.       PHQ2/9: Depression screen [facility] 2/9 06/03/2019 03/03/2019 01/23/2019 07/24/2018 06/10/2018    Decreased Interest 0 0 0 0 0  Down, Depressed, Hopeless 0 0 0 0 0  PHQ - 2 Score 0 0 0 0 0  Altered sleeping 0 0 0 0 0  Tired, decreased energy 0 0 0 0 0  Change in appetite 0 0 0 0 0  Feeling bad or failure about yourself  0 0 0 0 0  Trouble concentrating 0 0 0 0 0  Moving slowly or fidgety/restless 0 0 0 0 0  Suicidal thoughts 0 0 0 0 0  PHQ-9 Score 0 0 0 0 0  Difficult doing work/chores Not difficult at all Not difficult at all Not difficult at all Not difficult at all Not difficult at all    phq 9 is negative, reviewed today  Fall Risk: Fall Risk  06/03/2019 03/03/2019 03/03/2019 01/23/2019 07/24/2018  Falls in the past year? 0 0 0 0 0  Comment - - - - -  Number falls in past yr: 0 0 0 0 0  Comment - - - - -  Injury with Fall? 0 0 0 0 0  Risk for fall due to : - Impaired mobility;Impaired balance/gait;Orthopedic patient - - -  Follow up - Education provided;Falls prevention discussed;Falls evaluation completed - - Falls evaluation completed    Functional Status Survey: Is the patient deaf or have difficulty hearing?: No Does the patient have difficulty seeing, even when wearing glasses/contacts?: No Does the patient have difficulty concentrating, remembering, or making decisions?: No Does the patient have difficulty walking or climbing stairs?: No Does the patient have difficulty dressing or bathing?: No Does the patient have difficulty doing errands alone such as visiting a doctor's office or shopping?: No   Assessment & Plan:     ICD-10-CM   1. Hypertension goal BP (  blood pressure) < 140/90  [id number] COMPLETE METABOLIC PANEL WITH GFR   Blood pressure well controlled even on the low side no symptoms, discussed with the patient monitoring for any lightheadedness and decreasing meds  2. Obesity, Class III, BMI 40-49.9 (morbid obesity) ([facility])  E66.01    Severe morbid obesity, patient has limited ambulation, does which she can with diet  3. Mixed hyperlipidemia  E78.2 COMPLETE  METABOLIC PANEL WITH GFR    Lipid panel   Compliant with statin medication, no side effects or concerns recheck lipid panel  4. Pre-diabetes  [id number] COMPLETE METABOLIC PANEL WITH GFR   On metformin, not checking blood sugars, recheck labs  5. Abnormality of gait and mobility  R26.9   6. Limited mobility  Z74.09    Encourage PT evaluation or assistance, she had requested a new walker but has a new insurance company she will inquire about orders or preferred vendors  7. Risk for falls  Z91.81    See above, discussed fall risk, educated, encouraged to use walker  8. Age-related osteoporosis without current pathological fracture  M81.0 DG Bone Density   On Fosamax no side effects or concerns, supplementing appropriately, due for DEXA scan  9. Idiopathic chronic gout without tophus, unspecified site  [id number] COMPLETE METABOLIC PANEL WITH GFR   On allopurinol and working on diet, no recent flares  10. Right-sided uninodular goiter  E04.1 TSH    T4, free   Per specialist, no surgery planned, she states she just needs her thyroid levels checked periodically no symptoms of hyper or hypothyroid  11. Hypothyroidism, unspecified type  E03.9 TSH    T4, free   Abnormal labs in the past not currently on meds see above checking labs  12. Encounter for medication monitoring  Z51.81 CBC with Differential/Platelet    COMPLETE METABOLIC PANEL WITH GFR    Lipid panel    TSH    T4, free  13. Need for shingles vaccine  Z23 Zoster Vaccine Adjuvanted [facility]) injection   Patient encouraged to follow-up in 4 months  Delsa Grana, PA-C 06/03/19 11:34 AM

## 2019-06-04 LAB — TSH: TSH: 3.19 mIU/L (ref 0.40–4.50)

## 2019-06-04 LAB — CBC WITH DIFFERENTIAL/PLATELET
Absolute Monocytes: 666 cells/uL (ref 200–950)
Basophils Absolute: 62 cells/uL (ref 0–200)
Basophils Relative: 0.6 %
Eosinophils Absolute: 333 cells/uL (ref 15–500)
Eosinophils Relative: 3.2 %
HCT: 41.1 % (ref 35.0–45.0)
Hemoglobin: 13.1 g/dL (ref 11.7–15.5)
Lymphs Abs: 2694 cells/uL (ref 850–3900)
MCH: 27 pg (ref 27.0–33.0)
MCHC: 31.9 g/dL — ABNORMAL LOW (ref 32.0–36.0)
MCV: 84.7 fL (ref 80.0–100.0)
MPV: 10.7 fL (ref 7.5–12.5)
Monocytes Relative: 6.4 %
Neutro Abs: 6646 cells/uL (ref 1500–7800)
Neutrophils Relative %: 63.9 %
Platelets: 218 10*3/uL (ref 140–400)
RBC: 4.85 10*6/uL (ref 3.80–5.10)
RDW: 13 % (ref 11.0–15.0)
Total Lymphocyte: 25.9 %
WBC: 10.4 10*3/uL (ref 3.8–10.8)

## 2019-06-04 LAB — COMPLETE METABOLIC PANEL WITH GFR
AG Ratio: 1.4 (calc) (ref 1.0–2.5)
ALT: 13 U/L (ref 6–29)
AST: 16 U/L (ref 10–35)
Albumin: 4.1 g/dL (ref 3.6–5.1)
Alkaline phosphatase (APISO): 54 U/L (ref 37–153)
BUN/Creatinine Ratio: 19 (calc) (ref 6–22)
BUN: 19 mg/dL (ref 7–25)
CO2: 23 mmol/L (ref 20–32)
Calcium: 10.2 mg/dL (ref 8.6–10.4)
Chloride: 103 mmol/L (ref 98–110)
Creat: 0.99 mg/dL — ABNORMAL HIGH (ref 0.60–0.93)
GFR, Est African American: 64 mL/min/{1.73_m2} (ref >=60)
GFR, Est Non African American: 55 mL/min/{1.73_m2} — ABNORMAL LOW (ref >=60)
Globulin: 3 g/dL (calc) (ref 1.9–3.7)
Glucose, Bld: 133 mg/dL — ABNORMAL HIGH (ref 65–99)
Potassium: 4.3 mmol/L (ref 3.5–5.3)
Sodium: 140 mmol/L (ref 135–146)
Total Bilirubin: 1.1 mg/dL (ref 0.2–1.2)
Total Protein: 7.1 g/dL (ref 6.1–8.1)

## 2019-06-04 LAB — LIPID PANEL
Cholesterol: 110 mg/dL (ref <200)
HDL: 45 mg/dL — ABNORMAL LOW (ref >=50)
LDL Cholesterol (Calc): 48 mg/dL (calc)
Non-HDL Cholesterol (Calc): 65 mg/dL (calc) (ref <130)
Total CHOL/HDL Ratio: 2.4 (calc) (ref <5.0)
Triglycerides: 91 mg/dL (ref <150)

## 2019-06-04 LAB — T4, FREE: Free T4: 1.1 ng/dL (ref 0.8–1.8)

## 2019-06-12 ENCOUNTER — Encounter: Payer: Self-pay | Admitting: Family Medicine

## 2019-06-18 ENCOUNTER — Ambulatory Visit (INDEPENDENT_AMBULATORY_CARE_PROVIDER_SITE_OTHER): Payer: Medicare PPO

## 2019-06-18 VITALS — BP 130/72 | Ht 63.0 in | Wt 227.0 lb

## 2019-06-18 DIAGNOSIS — Z0000 Encounter for general adult medical examination without abnormal findings: Secondary | ICD-10-CM

## 2019-06-18 NOTE — Patient Instructions (Signed)
Tracy Bolton , Thank you for taking time to come for your Medicare Wellness Visit. I appreciate your ongoing commitment to your health goals. Please review the following plan we discussed and let me know if I can assist you in the future.   Screening recommendations/referrals: Colonoscopy: done 07/13/14 Mammogram: done 12/17/18 Bone Density: done 06/18/17. Please call [phone] to schedule your mammogram and bone density screening.  Recommended yearly ophthalmology/optometry visit for glaucoma screening and checkup Recommended yearly dental visit for hygiene and checkup  Vaccinations: Influenza vaccine: postponed Pneumococcal vaccine: done 05/21/17 Tdap vaccine: due Shingles vaccine: due for second dose after 08/08/19    Advanced directives: Advance directive discussed with you today. Even though you declined this today please call our office should you change your mind and we can give you the proper paperwork for you to fill out.  Conditions/risks identified: Recommend continuing to prevent falls and continue exercises.   Next appointment: Please follow up in one year for your Medicare Annual Wellness visit.     Preventive Care 36 Years and Older, Female Preventive care refers to lifestyle choices and visits with your health care provider that can promote health and wellness. What does preventive care include?  A yearly physical exam. This is also called an annual well check.  Dental exams once or twice a year.  Routine eye exams. Ask your health care provider how often you should have your eyes checked.  Personal lifestyle choices, including:  Daily care of your teeth and gums.  Regular physical activity.  Eating a healthy diet.  Avoiding tobacco and drug use.  Limiting alcohol use.  Practicing safe sex.  Taking low-dose aspirin every day.  Taking vitamin and mineral supplements as recommended by your health care provider. What happens during an annual well check? The  services and screenings done by your health care provider during your annual well check will depend on your age, overall health, lifestyle risk factors, and family history of disease. Counseling  Your health care provider may ask you questions about your:  Alcohol use.  Tobacco use.  Drug use.  Emotional well-being.  Home and relationship well-being.  Sexual activity.  Eating habits.  History of falls.  Memory and ability to understand (cognition).  Work and work Statistician.  Reproductive health. Screening  You may have the following tests or measurements:  Height, weight, and BMI.  Blood pressure.  Lipid and cholesterol levels. These may be checked every 5 years, or more frequently if you are over 39 years old.  Skin check.  Lung cancer screening. You may have this screening every year starting at age 4 if you have a 30-pack-year history of smoking and currently smoke or have quit within the past 15 years.  Fecal occult blood test (FOBT) of the stool. You may have this test every year starting at age 21.  Flexible sigmoidoscopy or colonoscopy. You may have a sigmoidoscopy every 5 years or a colonoscopy every 10 years starting at age 15.  Hepatitis C blood test.  Hepatitis B blood test.  Sexually transmitted disease (STD) testing.  Diabetes screening. This is done by checking your blood sugar (glucose) after you have not eaten for a while (fasting). You may have this done every 1-3 years.  Bone density scan. This is done to screen for osteoporosis. You may have this done starting at age 58.  Mammogram. This may be done every 1-2 years. Talk to your health care provider about how often you should have regular mammograms.  Talk with your health care provider about your test results, treatment options, and if necessary, the need for more tests. Vaccines  Your health care provider may recommend certain vaccines, such as:  Influenza vaccine. This is recommended  every year.  Tetanus, diphtheria, and acellular pertussis (Tdap, Td) vaccine. You may need a Td booster every 10 years.  Zoster vaccine. You may need this after age 30.  Pneumococcal 13-valent conjugate (PCV13) vaccine. One dose is recommended after age 15.  Pneumococcal polysaccharide (PPSV23) vaccine. One dose is recommended after age 55. Talk to your health care provider about which screenings and vaccines you need and how often you need them. This information is not intended to replace advice given to you by your health care provider. Make sure you discuss any questions you have with your health care provider. Document Released: 04/08/2015 Document Revised: 11/30/2015 Document Reviewed: 01/11/2015 Elsevier Interactive Patient Education  2017 [place] Prevention in the Home Falls can cause injuries. They can happen to people of all ages. There are many things you can do to make your home safe and to help prevent falls. What can I do on the outside of my home?  Regularly fix the edges of walkways and driveways and fix any cracks.  Remove anything that might make you trip as you walk through a door, such as a raised step or threshold.  Trim any bushes or trees on the path to your home.  Use bright outdoor lighting.  Clear any walking paths of anything that might make someone trip, such as rocks or tools.  Regularly check to see if handrails are loose or broken. Make sure that both sides of any steps have handrails.  Any raised decks and porches should have guardrails on the edges.  Have any leaves, snow, or ice cleared regularly.  Use sand or salt on walking paths during winter.  Clean up any spills in your garage right away. This includes oil or grease spills. What can I do in the bathroom?  Use night lights.  Install grab bars by the toilet and in the tub and shower. Do not use towel bars as grab bars.  Use non-skid mats or decals in the tub or shower.  If  you need to sit down in the shower, use a plastic, non-slip stool.  Keep the floor dry. Clean up any water that spills on the floor as soon as it happens.  Remove soap buildup in the tub or shower regularly.  Attach bath mats securely with double-sided non-slip rug tape.  Do not have throw rugs and other things on the floor that can make you trip. What can I do in the bedroom?  Use night lights.  Make sure that you have a light by your bed that is easy to reach.  Do not use any sheets or blankets that are too big for your bed. They should not hang down onto the floor.  Have a firm chair that has side arms. You can use this for support while you get dressed.  Do not have throw rugs and other things on the floor that can make you trip. What can I do in the kitchen?  Clean up any spills right away.  Avoid walking on wet floors.  Keep items that you use a lot in easy-to-reach places.  If you need to reach something above you, use a strong step stool that has a grab bar.  Keep electrical cords out of the way.  Do not use floor polish or wax that makes floors slippery. If you must use wax, use non-skid floor wax.  Do not have throw rugs and other things on the floor that can make you trip. What can I do with my stairs?  Do not leave any items on the stairs.  Make sure that there are handrails on both sides of the stairs and use them. Fix handrails that are broken or loose. Make sure that handrails are as long as the stairways.  Check any carpeting to make sure that it is firmly attached to the stairs. Fix any carpet that is loose or worn.  Avoid having throw rugs at the top or bottom of the stairs. If you do have throw rugs, attach them to the floor with carpet tape.  Make sure that you have a light switch at the top of the stairs and the bottom of the stairs. If you do not have them, ask someone to add them for you. What else can I do to help prevent falls?  Wear shoes  that:  Do not have high heels.  Have rubber bottoms.  Are comfortable and fit you well.  Are closed at the toe. Do not wear sandals.  If you use a stepladder:  Make sure that it is fully opened. Do not climb a closed stepladder.  Make sure that both sides of the stepladder are locked into place.  Ask someone to hold it for you, if possible.  Clearly mark and make sure that you can see:  Any grab bars or handrails.  First and last steps.  Where the edge of each step is.  Use tools that help you move around (mobility aids) if they are needed. These include:  Canes.  Walkers.  Scooters.  Crutches.  Turn on the lights when you go into a dark area. Replace any light bulbs as soon as they burn out.  Set up your furniture so you have a clear path. Avoid moving your furniture around.  If any of your floors are uneven, fix them.  If there are any pets around you, be aware of where they are.  Review your medicines with your doctor. Some medicines can make you feel dizzy. This can increase your chance of falling. Ask your doctor what other things that you can do to help prevent falls. This information is not intended to replace advice given to you by your health care provider. Make sure you discuss any questions you have with your health care provider. Document Released: 01/06/2009 Document Revised: 08/18/2015 Document Reviewed: 04/16/2014 Elsevier Interactive Patient Education  2017 [employer].

## 2019-06-18 NOTE — Progress Notes (Signed)
Subjective:   Tracy Bolton is a 79 y.o. female who presents for Medicare Annual (Subsequent) preventive examination.  Virtual Visit via Telephone Note  I connected with Tracy Bolton on 06/18/19 at 10:40 AM EDT by telephone and verified that I am speaking with the correct person using two identifiers.  Medicare Annual Wellness visit completed telephonically due to Covid-19 pandemic.   Location: Patient: home Provider: office   I discussed the limitations, risks, security and privacy concerns of performing an evaluation and management service by telephone and the availability of in person appointments. The patient expressed understanding and agreed to proceed.  Some vital signs may be absent or patient reported.   Clemetine Marker, LPN   Review of Systems:   Cardiac Risk Factors include: advanced age (>49men, >19 women);dyslipidemia;hypertension;obesity (BMI >30kg/m2)     Objective:     Vitals: BP 130/72   Ht 5\' 3"  (1.6 m)   Wt 227 lb (103 kg)   BMI 40.21 kg/m   Body mass index is 40.21 kg/m.  Advanced Directives 06/18/2019 06/10/2018 11/19/2016 05/18/2016 05/18/2016 11/14/2015 05/18/2015  Does Patient Have a Medical Advance Directive? No Yes Yes Yes No Yes Yes  Type of Advance Directive - [place];Living will - [place];Living will - Living will Living will  Does patient want to make changes to medical advance directive? No - Patient declined - - - - - -  Copy of Healthcare Power of Attorney in Chart? - No - copy requested - - - No - copy requested No - copy requested  Would patient like information on creating a medical advance directive? - - - - - - -    Tobacco Social History   Tobacco Use  Smoking Status Never Smoker  Smokeless Tobacco Never Used     Counseling given: Not Answered   Clinical Intake:  Pre-visit preparation completed: Yes  Pain : No/denies pain     BMI - recorded: 40.21 Nutritional Status: BMI > 30   Obese Nutritional Risks: None Diabetes: No  How often do you need to have someone help you when you read instructions, pamphlets, or other written materials from your doctor or pharmacy?: 1 - Never  Interpreter Needed?: No  Information entered by :: Clemetine Marker LPN  Past Medical History:  Diagnosis Date  . Arthritis   . Gout, chronic   . Hyperlipidemia   . Hypertension   . Osteoporosis   . Thyroid disease    Past Surgical History:  Procedure Laterality Date  . CATARACT EXTRACTION, BILATERAL  02/2018  . COLONOSCOPY  2016  . LUNG SURGERY Right 2004   biopsy normal   Family History  Problem Relation Age of Onset  . Bone cancer Father 85  . Thyroid disease Sister   . Cancer Mother 54       brain tumor  . Breast cancer Neg Hx    Social History   Socioeconomic History  . Marital status: Single    Spouse name: Not on file  . Number of children: 2  . Years of education: Not on file  . Highest education level: Some college, no degree  Occupational History  . Occupation: retired  Tobacco Use  . Smoking status: Never Smoker  . Smokeless tobacco: Never Used  Substance and Sexual Activity  . Alcohol use: No    Alcohol/week: 0.0 standard drinks  . Drug use: No  . Sexual activity: Not Currently  Other Topics Concern  . Not  on file  Social History Narrative   Pt lives alone.    Social Determinants of Health   Financial Resource Strain: Low Risk   . Difficulty of Paying Living Expenses: Not hard at all  Food Insecurity: No Food Insecurity  . Worried About Charity fundraiser in the Last Year: Never true  . Ran Out of Food in the Last Year: Never true  Transportation Needs: No Transportation Needs  . Lack of Transportation (Medical): No  . Lack of Transportation (Non-Medical): No  Physical Activity: Insufficiently Active  . Days of Exercise per Week: 7 days  . Minutes of Exercise per Session: 10 min  Stress: No Stress Concern Present  . Feeling of Stress : Not at  all  Social Connections: Somewhat Isolated  . Frequency of Communication with Friends and Family: More than three times a week  . Frequency of Social Gatherings with Friends and Family: More than three times a week  . Attends Religious Services: More than 4 times per year  . Active Member of Clubs or Organizations: No  . Attends Archivist Meetings: Never  . Marital Status: Divorced    Outpatient Encounter Medications as of 06/18/2019  Medication Sig  . alendronate (FOSAMAX) 70 MG tablet TAKE 1 TABLET BY MOUTH ONCE A WEEK TAKE WITH FULL GLASS OF WATER ON EMPTY STOMACH  . allopurinol (ZYLOPRIM) 100 MG tablet Take 1 tablet (100 mg total) by mouth daily.  Marland Kitchen amLODipine (NORVASC) 10 MG tablet Take 1 tablet (10 mg total) by mouth daily.  Marland Kitchen aspirin 81 MG tablet Take 81 mg by mouth.  Marland Kitchen atenolol (TENORMIN) 50 MG tablet Take 1 tablet (50 mg total) by mouth daily.  Marland Kitchen atorvastatin (LIPITOR) 10 MG tablet TAKE 1 TABLET BY MOUTH EVERYDAY AT BEDTIME  . [facility] SENNA 8.6 MG tablet TAKE 1 TABLET (8.6 MG TOTAL) BY MOUTH 2 (TWO) TIMES DAILY AS NEEDED FOR CONSTIPATION.  . metFORMIN (GLUCOPHAGE-XR) 500 MG 24 hr tablet Take 2 tablets (1,000 mg total) by mouth daily.  . Multiple Vitamin (MULTI-VITAMINS) TABS Take by mouth.  . nystatin (MYCOSTATIN/NYSTOP) powder APPLY TO AFFECTED AREA OF SKIN 3X A DAY AS NEEDED IN A SUFFICIENT AMOUNT (Patient not taking: Reported on 06/18/2019)   No facility-administered encounter medications on file as of 06/18/2019.    Activities of Daily Living In your present state of health, do you have any difficulty performing the following activities: 06/18/2019 06/03/2019  Hearing? N N  Comment declines hearing aids -  Vision? N N  Difficulty concentrating or making decisions? N N  Walking or climbing stairs? N N  Dressing or bathing? N N  Doing errands, shopping? N N  Preparing Food and eating ? N -  Using the Toilet? N -  In the past six months, have you accidently leaked  urine? N -  Do you have problems with loss of bowel control? N -  Managing your Medications? N -  Managing your Finances? N -  Housekeeping or managing your Housekeeping? N -  Some recent data might be hidden    Patient Care Team: Delsa Grana, PA-C as PCP - General (Family Medicine) Loney Loh, Michaell Cowing, MD as Referring Physician (Endocrinology)    Assessment:   This is a routine wellness examination for Gerilyn.  Exercise Activities and Dietary recommendations Current Exercise Habits: Home exercise routine, Type of exercise: calisthenics, Time (Minutes): 10, Frequency (Times/Week): 7, Weekly Exercise (Minutes/Week): 70, Intensity: Mild, Exercise limited by: orthopedic condition(s)  Goals    .  Weight (lb) < 230 lb (104.3 kg) (pt-stated)       Fall Risk Fall Risk  06/18/2019 06/03/2019 03/03/2019 03/03/2019 01/23/2019  Falls in the past year? 0 0 0 0 0  Comment - - - - -  Number falls in past yr: 0 0 0 0 0  Comment - - - - -  Injury with Fall? 0 0 0 0 0  Risk for fall due to : Impaired mobility - Impaired mobility;Impaired balance/gait;Orthopedic patient - -  Follow up Falls prevention discussed - Education provided;Falls prevention discussed;Falls evaluation completed - -   FALL RISK PREVENTION PERTAINING TO THE HOME:  Any stairs in or around the home? No  If so, do they handrails? No   Home free of loose throw rugs in walkways, pet beds, electrical cords, etc? Yes  Adequate lighting in your home to reduce risk of falls? Yes   ASSISTIVE DEVICES UTILIZED TO PREVENT FALLS:  Life alert? Yes Use of a cane, walker or w/c? Yes  Grab bars in the bathroom? Yes  Shower chair or bench in shower? No  Elevated toilet seat or a handicapped toilet? Yes   DME ORDERS:  DME order needed?  No   TIMED UP AND GO:  Was the test performed? No . Telephonic visit.   Education: Fall risk prevention has been discussed.  Intervention(s) required? No   Depression Screen PHQ 2/9 Scores  06/18/2019 06/03/2019 03/03/2019 01/23/2019  PHQ - 2 Score 0 0 0 0  PHQ- 9 Score - 0 0 0     Cognitive Function     6CIT Screen 06/18/2019 06/10/2018 05/21/2017  What Year? 0 points 0 points 0 points  What month? 0 points 0 points 0 points  What time? 0 points 0 points 0 points  Count back from 20 0 points 0 points 0 points  Months in reverse 0 points 0 points 0 points  Repeat phrase 0 points 0 points 0 points  Total Score 0 0 0    Immunization History  Administered Date(s) Administered  . Pneumococcal Conjugate-13 05/21/2017  . Pneumococcal Polysaccharide-23 12/31/2013, 05/18/2015  . Zoster Recombinat (Shingrix) 06/08/2019    Qualifies for Shingles Vaccine? Yes  Zostavax . Due for second dose of Shingrix.   Tdap: Although this vaccine is not a covered service during a Wellness Exam, does the patient still wish to receive this vaccine today?  No .  Education has been provided regarding the importance of this vaccine. Advised may receive this vaccine at local pharmacy or Health Dept. Aware to provide a copy of the vaccination record if obtained from local pharmacy or Health Dept. Verbalized acceptance and understanding.  Flu Vaccine: Due for Flu vaccine. Does the patient want to receive this vaccine today?  No . Education has been provided regarding the importance of this vaccine but still declined. Advised may receive this vaccine at local pharmacy or Health Dept. Aware to provide a copy of the vaccination record if obtained from local pharmacy or Health Dept. Verbalized acceptance and understanding.  Pneumococcal Vaccine: Up to date   Screening Tests Health Maintenance  Topic Date Due  . INFLUENZA VACCINE  06/24/2019 (Originally 10/25/2018)  . DEXA SCAN  06/19/2019  . MAMMOGRAM  12/17/2019  . TETANUS/TDAP  06/25/2023  . PNA vac Low Risk Adult  Addressed    Cancer Screenings:  Colorectal Screening: Completed 07/13/14. No longer required.   Mammogram: Completed 12/17/18. Repeat  every year.   Bone Density: Completed 06/18/17. Results reflectOSTEOPOROSIS. Repeat  every 2 years. Ordered 06/04/19. Pt provided with contact information and advised to call to schedule appt.   Lung Cancer Screening: (Low Dose CT Chest recommended if Age 17-80 years, 30 pack-year currently smoking OR have quit w/in 15years.) does not qualify.   Additional Screening:  Hepatitis C Screening: no longer required  Vision Screening: Recommended annual ophthalmology exams for early detection of glaucoma and other disorders of the eye. Is the patient up to date with their annual eye exam?  Yes  Who is the provider or what is the name of the office in which the pt attends annual eye exams? [facility]   Dental Screening: Recommended annual dental exams for proper oral hygiene  Community Resource Referral:  CRR required this visit?  No      Plan:     I have personally reviewed and addressed the Medicare Annual Wellness questionnaire and have noted the following in the patient's chart:  A. Medical and social history B. Use of alcohol, tobacco or illicit drugs  C. Current medications and supplements D. Functional ability and status E.  Nutritional status F.  Physical activity G. Advance directives H. List of other physicians I.  Hospitalizations, surgeries, and ER visits in previous 12 months J.  [place] such as hearing and vision if needed, cognitive and depression L. Referrals and appointments   In addition, I have reviewed and discussed with patient certain preventive protocols, quality metrics, and best practice recommendations. A written personalized care plan for preventive services as well as general preventive health recommendations were provided to patient.   Signed,  Clemetine Marker, LPN Nurse Health Advisor   Nurse Notes: none

## 2019-07-02 DIAGNOSIS — M8580 Other specified disorders of bone density and structure, unspecified site: Secondary | ICD-10-CM | POA: Insufficient documentation

## 2019-08-22 ENCOUNTER — Other Ambulatory Visit: Payer: Self-pay | Admitting: Family Medicine

## 2019-08-22 DIAGNOSIS — E782 Mixed hyperlipidemia: Secondary | ICD-10-CM

## 2019-08-30 ENCOUNTER — Other Ambulatory Visit: Payer: Self-pay | Admitting: Family Medicine

## 2019-08-30 DIAGNOSIS — M810 Age-related osteoporosis without current pathological fracture: Secondary | ICD-10-CM

## 2019-09-29 ENCOUNTER — Other Ambulatory Visit: Payer: Self-pay | Admitting: Family Medicine

## 2019-09-29 DIAGNOSIS — I10 Essential (primary) hypertension: Secondary | ICD-10-CM

## 2019-09-29 DIAGNOSIS — M1A00X0 Idiopathic chronic gout, unspecified site, without tophus (tophi): Secondary | ICD-10-CM

## 2019-09-29 DIAGNOSIS — R7303 Prediabetes: Secondary | ICD-10-CM

## 2019-10-06 ENCOUNTER — Ambulatory Visit: Payer: Medicare PPO | Admitting: Family Medicine

## 2019-10-06 ENCOUNTER — Other Ambulatory Visit: Payer: Self-pay

## 2019-10-06 ENCOUNTER — Encounter: Payer: Self-pay | Admitting: Family Medicine

## 2019-10-06 VITALS — BP 128/78 | HR 73 | Temp 97.0°F | Resp 16 | Ht 63.0 in | Wt 227.7 lb

## 2019-10-06 DIAGNOSIS — R7303 Prediabetes: Secondary | ICD-10-CM

## 2019-10-06 DIAGNOSIS — E782 Mixed hyperlipidemia: Secondary | ICD-10-CM | POA: Diagnosis not present

## 2019-10-06 DIAGNOSIS — R300 Dysuria: Secondary | ICD-10-CM | POA: Diagnosis not present

## 2019-10-06 DIAGNOSIS — I10 Essential (primary) hypertension: Secondary | ICD-10-CM

## 2019-10-06 DIAGNOSIS — R809 Proteinuria, unspecified: Secondary | ICD-10-CM

## 2019-10-06 DIAGNOSIS — M810 Age-related osteoporosis without current pathological fracture: Secondary | ICD-10-CM

## 2019-10-06 DIAGNOSIS — N183 Chronic kidney disease, stage 3 unspecified: Secondary | ICD-10-CM

## 2019-10-06 LAB — POCT URINALYSIS DIPSTICK
Glucose, UA: NEGATIVE
Ketones, UA: NEGATIVE
Nitrite, UA: NEGATIVE
Protein, UA: POSITIVE — AB
Spec Grav, UA: 1.015 (ref 1.010–1.025)
Urobilinogen, UA: 0.2 E.U./dL
pH, UA: 5 (ref 5.0–8.0)

## 2019-10-06 MED ORDER — ALENDRONATE SODIUM 70 MG PO TABS: 70.0000 mg | ORAL_TABLET | ORAL | 0 refills | Status: AC

## 2019-10-06 NOTE — Progress Notes (Deleted)
Last week pt started to some "sensation   No COVID vaccine  She did shingrix   Fosamax - supposed to be done in Sept -

## 2019-10-06 NOTE — Progress Notes (Signed)
Name: Tracy Bolton   MRN: [id number]    DOB: [birth year]   Date:10/06/2019       Progress Note  Chief Complaint  Patient presents with  . Hypertension  . Hyperlipidemia  . Prediabetes  . Urinary Tract Infection     Subjective:   Tracy Bolton is a 79 y.o. female, presents to clinic for suspected UTI and her routine f/up for multiple chronic conditions   Last week pt started to some "slight sensation down there" with minor increase in urinary frequency, but she also reports pushing more fluids than normal for her.  She denies dysuria, hematuria, urgency, abdominal pain, pelvic pain or pressure.  She has low back pain that comes and goes is very minor but no flank pain, denies any nausea vomiting fever chills sweats change in appetite balance or activity level.  No COVID vaccine yet-she got the Shingrix shot and was told by the pharmacist that she needs to wait several months before getting Covid vaccinations  Fosamax - supposed to be done in Sept, but she wants to make sure that they do not put through extra refills.  Her endocrinologist who is managed her goiter and thyroid has retired she sees them annually in April, last labs were normal, she will need a new referral for next years visit.  Hypertension:  Pt diagnosed with HTN 6 years Currently managed on atenolol and amlodipine Pt reports good med compliance and denies any SE.  No lightheadedness, hypotension, syncope. Blood pressure today is well controlled. BP Readings from Last 3 Encounters:  10/06/19 128/78  06/18/19 130/72  06/03/19 114/78  Pt denies CP, SOB, exertional sx, LE edema, palpitation, Ha's, visual disturbances   Hyperlipidemia: Current Medication Regimen:  lipitor 10 mg daily Last Lipids: Lab Results  Component Value Date   CHOL 110 06/03/2019   HDL 45 (L) 06/03/2019   LDLCALC 48 06/03/2019   TRIG 91 06/03/2019   CHOLHDL 2.4 06/03/2019   - Denies: Chest pain, shortness of breath, myalgias,  claudication    Gout - Uric acid still above goal 6.7, improved from last year 7.7 same dose of allopurinol 100 but she improved her diet and she believes this has helped control her gout flares  Right sided goiter and hypothyroid Dr. Loney Loh -she states they are not going to do surgery on it unless it starting to interfere with her airway, they monitor labs on it occasionally but she is not on any thyroid medications      Current Outpatient Medications:  .  alendronate (FOSAMAX) 70 MG tablet, TAKE 1 TABLET BY MOUTH ONCE A WEEK TAKE WITH FULL GLASS OF WATER ON EMPTY STOMACH, Disp: 12 tablet, Rfl: 1 .  allopurinol (ZYLOPRIM) 100 MG tablet, TAKE 1 TABLET BY MOUTH EVERY DAY, Disp: 90 tablet, Rfl: 3 .  amLODipine (NORVASC) 10 MG tablet, TAKE 1 TABLET BY MOUTH EVERY DAY, Disp: 90 tablet, Rfl: 3 .  aspirin 81 MG tablet, Take 81 mg by mouth., Disp: , Rfl:  .  atenolol (TENORMIN) 50 MG tablet, TAKE 1 TABLET BY MOUTH EVERY DAY, Disp: 90 tablet, Rfl: 3 .  atorvastatin (LIPITOR) 10 MG tablet, TAKE 1 TABLET BY MOUTH EVERYDAY AT BEDTIME, Disp: 90 tablet, Rfl: 3 .  [facility] SENNA 8.6 MG tablet, TAKE 1 TABLET (8.6 MG TOTAL) BY MOUTH 2 (TWO) TIMES DAILY AS NEEDED FOR CONSTIPATION., Disp: 180 tablet, Rfl: 1 .  metFORMIN (GLUCOPHAGE-XR) 500 MG 24 hr tablet, TAKE 2 TABLETS BY MOUTH EVERY DAY, Disp:  180 tablet, Rfl: 3 .  Multiple Vitamin (MULTI-VITAMINS) TABS, Take by mouth., Disp: , Rfl:  .  nystatin (MYCOSTATIN/NYSTOP) powder, APPLY TO AFFECTED AREA OF SKIN 3X A DAY AS NEEDED IN A SUFFICIENT AMOUNT (Patient not taking: Reported on 06/18/2019), Disp: 30 g, Rfl: 2  Patient Active Problem List   Diagnosis Date Noted  . Limited mobility 03/03/2019  . Abnormality of gait and mobility 03/03/2019  . Risk for falls 03/03/2019  . Urine test positive for microalbuminuria 06/06/2017  . Full code status 05/21/2017  . Preventative health care 05/21/2017  . Elevated serum glutamic pyruvic transaminase (SGPT) level  05/19/2016  . Callus of foot 05/18/2016  . Urine abnormality 11/14/2015  . Encounter for medication monitoring 11/14/2015  . Hyperlipidemia 11/14/2015  . Pre-diabetes 05/18/2015  . OP (osteoporosis) 09/06/2014  . Chronic gouty arthritis 09/06/2014  . Obesity, Class III, BMI 40-49.9 (morbid obesity) ([place]) 09/06/2014  . 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 Hypertension goal BP (blood pressure) < 140/90 07/12/2014  . Weakness of both lower extremities 07/12/2014  . Dry skin 07/12/2014  . Right-sided uninodular goiter 07/12/2014  . Cyst of eye 07/12/2014  . Constipation, chronic 07/12/2014  . Subclinical hypothyroidism 02/06/2013    Past Surgical History:  Procedure Laterality Date  . CATARACT EXTRACTION, BILATERAL  02/2018  . COLONOSCOPY  2016  . LUNG SURGERY Right 2004   biopsy normal    Family History  Problem Relation Age of Onset  . Bone cancer Father 66  . Thyroid disease Sister   . Cancer Mother 84       brain tumor  . Breast cancer Neg Hx     Social History   Tobacco Use  . Smoking status: Never Smoker  . Smokeless tobacco: Never Used  Vaping Use  . Vaping Use: Never used  Substance Use Topics  . Alcohol use: No    Alcohol/week: 0.0 standard drinks  . Drug use: No     No Known Allergies  Health Maintenance  Topic Date Due  . Hepatitis C Screening  Never done  . COVID-19 Vaccine (1) Never done  . DEXA SCAN  06/19/2019  . INFLUENZA VACCINE  10/25/2019  . MAMMOGRAM  12/17/2019  . TETANUS/TDAP  06/25/2023  . PNA vac Low Risk Adult  Addressed    Chart Review Today: I personally reviewed active problem list, medication list, allergies, family history, social history, health maintenance, notes from last encounter, lab results, imaging with the patient/caregiver today.   Review of Systems  10 Systems reviewed and are negative for acute change except as noted in the HPI.   Objective:   Vitals:   10/06/19 1008  BP: 128/78  Pulse: 73   Resp: 16  Temp: (!) 97 F (36.1 C)  TempSrc: Temporal  SpO2: 93%  Weight: 227 lb 11.2 oz (103.3 kg)  Height: 5\' 3"  (1.6 m)    Body mass index is 40.34 kg/m.  Physical Exam Vitals and nursing note reviewed.  Constitutional:      Appearance: She is morbidly obese. She is not ill-appearing, toxic-appearing or diaphoretic.  HENT:     Head: Normocephalic and atraumatic.     Right Ear: External ear normal.     Left Ear: External ear normal.  Cardiovascular:     Rate and Rhythm: Normal rate and regular rhythm.     Pulses: Normal pulses.     Heart sounds: Normal heart sounds.  Pulmonary:     Effort: Pulmonary effort is normal.  No respiratory distress.     Breath sounds: Normal breath sounds. No stridor. No wheezing, rhonchi or rales.  Abdominal:     General: Bowel sounds are normal. There is no distension.     Palpations: Abdomen is soft.     Tenderness: There is no abdominal tenderness. There is no right CVA tenderness, left CVA tenderness, guarding or rebound.  Skin:    General: Skin is warm and dry.     Coloration: Skin is not jaundiced or pale.  Neurological:     Mental Status: She is alert. Mental status is at baseline.     Comments: Alert, walks with rolling walker  Psychiatric:        Mood and Affect: Mood normal.        Behavior: Behavior normal. Behavior is cooperative.         Assessment & Plan:     ICD-10-CM   1. Prediabetes  Q33.00 COMPLETE METABOLIC PANEL WITH GFR    Hemoglobin A1c    CANCELED: Hemoglobin A1C   Decreased metformin dose to 500 mg BID, last A1C was normal, A1C today  2. Mixed hyperlipidemia  T62.2 COMPLETE METABOLIC PANEL WITH GFR   LDL at goal with last labs, HDL mildly low, continue lipitor and working on diet and exercise as able  3. Obesity, Class III, BMI 40-49.9 (morbid obesity) (HCC)  E66.01    multiple comorbidities, osteopenia, joint pain, HTN, HLD  4. Essential hypertension  Q33 COMPLETE METABOLIC PANEL WITH GFR   stable, well  controlled  5. Dysuria  R30.0 POCT urinalysis dipstick    Urine Culture   minor urinary frequency, urine dip shows protein and trace leukocytes, no nitrites no blood, send for culture, abd exam benign, wait for culture  6. Proteinuria, unspecified type  H54.5 COMPLETE METABOLIC PANEL WITH GFR    Microalbumin / creatinine urine ratio   check microalbumin and renal function  7. Stage 3 chronic kidney disease, unspecified whether stage 3a or 3b CKD  G25.63 COMPLETE METABOLIC PANEL WITH GFR   monitoring  8. Age-related osteoporosis without current pathological fracture  M81.0 alendronate (FOSAMAX) 70 MG tablet   finishing fosamax     Return in about 6 months (around 04/07/2020) for Routine follow-up.   Delsa Grana, PA-C 10/06/19 10:24 AM

## 2019-10-07 LAB — COMPLETE METABOLIC PANEL WITH GFR
AG Ratio: 1.4 (calc) (ref 1.0–2.5)
ALT: 15 U/L (ref 6–29)
AST: 21 U/L (ref 10–35)
Albumin: 4.2 g/dL (ref 3.6–5.1)
Alkaline phosphatase (APISO): 51 U/L (ref 37–153)
BUN/Creatinine Ratio: 17 (calc) (ref 6–22)
BUN: 20 mg/dL (ref 7–25)
CO2: 26 mmol/L (ref 20–32)
Calcium: 10 mg/dL (ref 8.6–10.4)
Chloride: 102 mmol/L (ref 98–110)
Creat: 1.17 mg/dL — ABNORMAL HIGH (ref 0.60–0.93)
GFR, Est African American: 52 mL/min/{1.73_m2} — ABNORMAL LOW (ref >=60)
GFR, Est Non African American: 45 mL/min/{1.73_m2} — ABNORMAL LOW (ref >=60)
Globulin: 3 g/dL (calc) (ref 1.9–3.7)
Glucose, Bld: 150 mg/dL — ABNORMAL HIGH (ref 65–99)
Potassium: 4.6 mmol/L (ref 3.5–5.3)
Sodium: 140 mmol/L (ref 135–146)
Total Bilirubin: 0.9 mg/dL (ref 0.2–1.2)
Total Protein: 7.2 g/dL (ref 6.1–8.1)

## 2019-10-07 LAB — URINE CULTURE
MICRO NUMBER:: 10700075
SPECIMEN QUALITY:: ADEQUATE

## 2019-10-07 LAB — HEMOGLOBIN A1C
Hgb A1c MFr Bld: 5.4 % of total Hgb (ref <5.7)
Mean Plasma Glucose: 108 (calc)
eAG (mmol/L): 6 (calc)

## 2019-10-08 ENCOUNTER — Encounter: Payer: Self-pay | Admitting: Family Medicine

## 2019-10-08 LAB — MICROALBUMIN / CREATININE URINE RATIO
Creatinine, Urine: 44 mg/dL (ref 20–275)
Microalb Creat Ratio: 7 mcg/mg creat (ref <30)
Microalb, Ur: 0.3 mg/dL

## 2019-12-21 ENCOUNTER — Other Ambulatory Visit: Payer: Self-pay | Admitting: Family Medicine

## 2019-12-21 DIAGNOSIS — Z1231 Encounter for screening mammogram for malignant neoplasm of breast: Secondary | ICD-10-CM

## 2020-01-20 ENCOUNTER — Ambulatory Visit
Admission: RE | Admit: 2020-01-20 | Discharge: 2020-01-20 | Disposition: A | Discharge status: Home / Self Care | Payer: Medicare PPO | Source: Ambulatory Visit | Attending: Family Medicine | Admitting: Family Medicine

## 2020-01-20 ENCOUNTER — Other Ambulatory Visit: Payer: Self-pay

## 2020-01-20 DIAGNOSIS — Z1231 Encounter for screening mammogram for malignant neoplasm of breast: Secondary | ICD-10-CM | POA: Diagnosis present

## 2020-01-20 DIAGNOSIS — M810 Age-related osteoporosis without current pathological fracture: Secondary | ICD-10-CM | POA: Insufficient documentation

## 2020-01-27 ENCOUNTER — Other Ambulatory Visit: Payer: Self-pay | Admitting: Family Medicine

## 2020-01-27 DIAGNOSIS — B372 Candidiasis of skin and nail: Secondary | ICD-10-CM

## 2020-02-24 ENCOUNTER — Other Ambulatory Visit: Payer: Self-pay | Admitting: Family Medicine

## 2020-02-29 ENCOUNTER — Telehealth: Payer: Self-pay

## 2020-02-29 NOTE — Telephone Encounter (Signed)
lvm to schedule an appt Copied from [place] [phone]. Topic: General - Other >> Feb 26, 2020  4:49 PM Rainey Pines A wrote: Patient would like a callback in regards to medication refill request being denied for her laxative. Please advise

## 2020-02-29 NOTE — Telephone Encounter (Signed)
Patient stated that she gets her senna by prescription so it can be added to her pill pack.

## 2020-03-08 NOTE — Telephone Encounter (Signed)
Patient notified

## 2020-04-08 ENCOUNTER — Other Ambulatory Visit: Payer: Self-pay

## 2020-04-08 ENCOUNTER — Ambulatory Visit: Payer: Medicare PPO | Admitting: Family Medicine

## 2020-04-08 ENCOUNTER — Encounter: Payer: Self-pay | Admitting: Family Medicine

## 2020-04-08 VITALS — BP 130/84 | HR 84 | Temp 97.9°F | Resp 16 | Ht 63.0 in | Wt 222.4 lb

## 2020-04-08 DIAGNOSIS — K5900 Constipation, unspecified: Secondary | ICD-10-CM

## 2020-04-08 DIAGNOSIS — I10 Essential (primary) hypertension: Secondary | ICD-10-CM

## 2020-04-08 DIAGNOSIS — N183 Chronic kidney disease, stage 3 unspecified: Secondary | ICD-10-CM

## 2020-04-08 DIAGNOSIS — R7303 Prediabetes: Secondary | ICD-10-CM

## 2020-04-08 DIAGNOSIS — E782 Mixed hyperlipidemia: Secondary | ICD-10-CM

## 2020-04-08 DIAGNOSIS — E039 Hypothyroidism, unspecified: Secondary | ICD-10-CM

## 2020-04-08 DIAGNOSIS — Z6839 Body mass index (BMI) 39.0-39.9, adult: Secondary | ICD-10-CM

## 2020-04-08 DIAGNOSIS — M810 Age-related osteoporosis without current pathological fracture: Secondary | ICD-10-CM

## 2020-04-08 DIAGNOSIS — M1A00X0 Idiopathic chronic gout, unspecified site, without tophus (tophi): Secondary | ICD-10-CM

## 2020-04-08 DIAGNOSIS — Z5181 Encounter for therapeutic drug level monitoring: Secondary | ICD-10-CM

## 2020-04-08 DIAGNOSIS — Z7409 Other reduced mobility: Secondary | ICD-10-CM

## 2020-04-08 DIAGNOSIS — R809 Proteinuria, unspecified: Secondary | ICD-10-CM

## 2020-04-08 DIAGNOSIS — E041 Nontoxic single thyroid nodule: Secondary | ICD-10-CM

## 2020-04-08 DIAGNOSIS — Z9181 History of falling: Secondary | ICD-10-CM

## 2020-04-08 DIAGNOSIS — E559 Vitamin D deficiency, unspecified: Secondary | ICD-10-CM

## 2020-04-08 DIAGNOSIS — R269 Unspecified abnormalities of gait and mobility: Secondary | ICD-10-CM

## 2020-04-08 NOTE — Progress Notes (Signed)
Name: Tracy Bolton   MRN: [id number]    DOB: November 11, 1940   Date:04/08/2020       Progress Note  Chief Complaint  Patient presents with  . Diabetes  . Hypertension  . Hyperlipidemia    6 month follow up     Subjective:   Tracy Bolton is a 80 y.o. female, presents to clinic for 6 month routine f/up  HTN - managed with atenolol 50 mg qd and amlodipine 10 mg daily Pt reports good med compliance and denies any SE.   Blood pressure today is well controlled. BP Readings from Last 3 Encounters:  04/08/20 130/84  10/06/19 128/78  06/18/19 130/72   Pt denies CP, SOB, exertional sx, LE edema, palpitation, Ha's, visual disturbances, lightheadedness, hypotension, syncope. Dietary efforts for BP?  Trying to be healthy and move around more   Morbid Obesity - some weight loss since last OV - on metformin for prediabetes and morbid obesity, good compliance no SE or concers Lab Results  Component Value Date   HGBA1C 5.4 10/06/2019   Weight down 5 lbs  Wt Readings from Last 5 Encounters:  04/08/20 222 lb 6.4 oz (100.9 kg)  10/06/19 227 lb 11.2 oz (103.3 kg)  06/18/19 227 lb (103 kg)  06/03/19 226 lb 8 oz (102.7 kg)  03/03/19 233 lb (105.7 kg)   BMI Readings from Last 5 Encounters:  04/08/20 39.40 kg/m  10/06/19 40.34 kg/m  06/18/19 40.21 kg/m  06/03/19 40.12 kg/m  03/03/19 41.27 kg/m   Hx of gout on allopurinol 100 mg daily - no gouty flares in the past several months, good with low purine diet Lab Results  Component Value Date   LABURIC 6.7 02/06/2019   CKD:  Stage 3 Renal function recent labs: Lab Results  Component Value Date   GFRAA 52 (L) 10/06/2019   GFRAA 64 06/03/2019   GFRAA 63 02/06/2019   Lab Results  Component Value Date   CREATININE 1.17 (H) 10/06/2019   BUN 20 10/06/2019   NA 140 10/06/2019   K 4.6 10/06/2019   CL 102 10/06/2019   CO2 26 10/06/2019   Prediabetes on metformin 1000 mg once daily Lab Results  Component Value Date   HGBA1C 5.4  10/06/2019  was normal 6 months ago  Hyperlipidemia: Currently treated with lipitor 10 mg, pt reports good med compliance Last Lipids: Lab Results  Component Value Date   CHOL 110 06/03/2019   HDL 45 (L) 06/03/2019   LDLCALC 48 06/03/2019   TRIG 91 06/03/2019   CHOLHDL 2.4 06/03/2019   - Denies: Chest pain, shortness of breath, myalgias, claudication  Osteoporosis - did complete more than 5 years of fosamax, hx of vit d deficiency, last labs were normal, high fall risk Last dexa 01/20/2020 - bone density improved to osteopenia diffusely - pt newer and I did not see that she had been on fosamax for nearly 5 years - will likely need referral for prolia?  Large goiter and hx of abnormal thyroid labs w/o any current thyroid hormone replacement - was previously managed by Dr. Loney Loh -he would monitor the size of the goiter and her blood work but he has since retired and she does need to establish with a new specialist Goiter is extremely large more than 10 to 12 cm in diameter she reports that she has occasional scratchy throat but she has not had any change in the size for about 10 years she has not noted any new phonation changes,  dysphagia or discomfort  Osteoporosis -was on Fosamax per chart review back to 2016, last DEXA was reviewed, patient is currently off Fosamax but has not yet seen a specialist for Prolia, her bone density did improve and she has not had any recent falls but she has abnormal gait imbalance walks with a rolling walker and has difficulty with her mobility, no recent falls and she denies any prior fractures or recent falls  Constipation -she states that she was given [facility] senna stimulant laxative to take as needed and she started this when she was in [place] [facility]?  She uses occasionally, she states it was being prescribed for her because it was cheaper that way, she has not tried any other stool softener she is not currently having any abdominal pain, severe constipation,  having to strain or push, and she denies any changes in her bowel movements recently.   Current Outpatient Medications:  .  allopurinol (ZYLOPRIM) 100 MG tablet, TAKE 1 TABLET BY MOUTH EVERY DAY, Disp: 90 tablet, Rfl: 3 .  amLODipine (NORVASC) 10 MG tablet, TAKE 1 TABLET BY MOUTH EVERY DAY, Disp: 90 tablet, Rfl: 3 .  aspirin 81 MG tablet, Take 81 mg by mouth., Disp: , Rfl:  .  atenolol (TENORMIN) 50 MG tablet, TAKE 1 TABLET BY MOUTH EVERY DAY, Disp: 90 tablet, Rfl: 3 .  atorvastatin (LIPITOR) 10 MG tablet, TAKE 1 TABLET BY MOUTH EVERYDAY AT BEDTIME, Disp: 90 tablet, Rfl: 3 .  metFORMIN (GLUCOPHAGE-XR) 500 MG 24 hr tablet, TAKE 2 TABLETS BY MOUTH EVERY DAY, Disp: 180 tablet, Rfl: 3 .  Multiple Vitamin (MULTI-VITAMINS) TABS, Take by mouth., Disp: , Rfl:  .  nystatin (MYCOSTATIN/NYSTOP) powder, APPLY TO AFFECTED AREA OF SKIN 3X A DAY AS NEEDED IN A SUFFICIENT AMOUNT, Disp: 30 g, Rfl: 2 .  [facility] SENNA 8.6 MG tablet, TAKE 1 TABLET (8.6 MG TOTAL) BY MOUTH 2 (TWO) TIMES DAILY AS NEEDED FOR CONSTIPATION. (Patient not taking: Reported on 04/08/2020), Disp: 180 tablet, Rfl: 1  Patient Active Problem List   Diagnosis Date Noted  . Osteopenia 07/02/2019  . Limited mobility 03/03/2019  . Abnormality of gait and mobility 03/03/2019  . Risk for falls 03/03/2019  . Urine test positive for microalbuminuria 06/06/2017  . Full code status 05/21/2017  . Preventative health care 05/21/2017  . Elevated serum glutamic pyruvic transaminase (SGPT) level 05/19/2016  . Callus of foot 05/18/2016  . Urine abnormality 11/14/2015  . Encounter for medication monitoring 11/14/2015  . Hyperlipidemia 11/14/2015  . Pre-diabetes 05/18/2015  . OP (osteoporosis) 09/06/2014  . Chronic gouty arthritis 09/06/2014  . Obesity, Class III, BMI 40-49.9 (morbid obesity) ([place]) 09/06/2014  . 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 Hypertension goal BP (blood pressure) < 140/90 07/12/2014  . Weakness of both lower  extremities 07/12/2014  . Dry skin 07/12/2014  . Right-sided uninodular goiter 07/12/2014  . Cyst of eye 07/12/2014  . Constipation, chronic 07/12/2014  . Subclinical hypothyroidism 02/06/2013    Past Surgical History:  Procedure Laterality Date  . CATARACT EXTRACTION, BILATERAL  02/2018  . COLONOSCOPY  2016  . LUNG SURGERY Right 2004   biopsy normal    Family History  Problem Relation Age of Onset  . Bone cancer Father 92  . Thyroid disease Sister   . Cancer Mother 34       brain tumor  . Breast cancer Neg Hx     Social History   Tobacco Use  . Smoking status: Never Smoker  .  Smokeless tobacco: Never Used  Vaping Use  . Vaping Use: Never used  Substance Use Topics  . Alcohol use: No    Alcohol/week: 0.0 standard drinks  . Drug use: No     No Known Allergies  Health Maintenance  Topic Date Due  . INFLUENZA VACCINE  Never done  . Hepatitis C Screening  10/05/2020 (Originally April 15, 1940)  . COVID-19 Vaccine (3 - Booster for Moderna series) 06/15/2020  . MAMMOGRAM  01/19/2021  . DEXA SCAN  01/19/2022  . TETANUS/TDAP  06/25/2023  . PNA vac Low Risk Adult  Addressed    Chart Review Today: I personally reviewed active problem list, medication list, allergies, family history, social history, health maintenance, notes from last encounter, lab results, imaging with the patient/caregiver today.   Review of Systems  Constitutional: Negative.   HENT: Negative.   Eyes: Negative.   Respiratory: Negative.   Cardiovascular: Negative.   Gastrointestinal: Negative.   Endocrine: Negative.   Genitourinary: Negative.   Musculoskeletal: Negative.   Skin: Negative.   Allergic/Immunologic: Negative.   Neurological: Negative.   Hematological: Negative.   Psychiatric/Behavioral: Negative.   All other systems reviewed and are negative.    Objective:   Vitals:   04/08/20 1059  BP: 130/84  Pulse: 84  Resp: 16  Temp: 97.9 F (36.6 C)  TempSrc: Oral  SpO2: 98%   Weight: 222 lb 6.4 oz (100.9 kg)  Height: 5\' 3"  (1.6 m)    Body mass index is 39.4 kg/m.  Physical Exam Vitals and nursing note reviewed.  Constitutional:      General: She is not in acute distress.    Appearance: Normal appearance. She is well-developed and well-groomed. She is morbidly obese. She is not ill-appearing, toxic-appearing or diaphoretic.     Interventions: Face mask in place.  HENT:     Head: Normocephalic and atraumatic.     Right Ear: External ear normal.     Left Ear: External ear normal.  Eyes:     General:        Right eye: No discharge.        Left eye: No discharge.     Conjunctiva/sclera: Conjunctivae normal.  Neck:     Thyroid: Thyroid mass and thyromegaly present. No thyroid tenderness.     Trachea: Phonation normal.     Comments: Very large goiter mostly right sided anterior neck at least greater than 12 x 12 x 12 cm Cardiovascular:     Rate and Rhythm: Normal rate and regular rhythm.     Heart sounds: Normal heart sounds. No murmur heard. No friction rub. No gallop.   Pulmonary:     Effort: Pulmonary effort is normal. No respiratory distress.     Breath sounds: Normal breath sounds. No stridor. No wheezing or rales.  Abdominal:     General: Bowel sounds are normal. There is no distension.     Palpations: Abdomen is soft.     Tenderness: There is no abdominal tenderness.     Comments: Soft obese abdomen, nontender, nondistended, normal bowel sounds x4  Musculoskeletal:     Cervical back: Full passive range of motion without pain and normal range of motion.     Right lower leg: No edema.     Left lower leg: No edema.  Lymphadenopathy:     Cervical: No cervical adenopathy.  Skin:    General: Skin is warm and dry.     Capillary Refill: Capillary refill takes less than 2 seconds.  Coloration: Skin is not jaundiced or pale.     Findings: No rash.  Neurological:     Mental Status: She is alert. Mental status is at baseline.     Gait: Gait  abnormal.  Psychiatric:        Mood and Affect: Mood normal.        Behavior: Behavior normal. Behavior is cooperative.         Assessment & Plan:     ICD-10-CM   1. Prediabetes  X83.38 COMPLETE METABOLIC PANEL WITH GFR    Hemoglobin A1c   Has been well controlled she is on metformin, good compliance, recheck labs today  2. Mixed hyperlipidemia  S50.5 COMPLETE METABOLIC PANEL WITH GFR    Lipid panel   Good statin compliance, no myalgias side effects or concerns due for lipids  3. Class 2 severe obesity with serious comorbidity and body mass index (BMI) of 39.0 to 39.9 in adult, unspecified obesity type (HCC)  E66.01 CBC with Differential/Platelet   L97.67 COMPLETE METABOLIC PANEL WITH GFR    Lipid panel    Hemoglobin A1c    TSH   Encourage continued healthy diet and lifestyle efforts, some weight loss, multiple comorbidities including HTN, HLD, preDM, osteoporosis  4. Hypothyroidism, unspecified type  E03.9 TSH   Not currently on medications, continue to monitor TSH  5. Essential hypertension  H41 COMPLETE METABOLIC PANEL WITH GFR   Stable, well controlled, blood pressure at goal today continue medications and diet and lifestyle efforts  6. Stage 3 chronic kidney disease, unspecified whether stage 3a or 3b CKD (HCC)  P37.90 COMPLETE METABOLIC PANEL WITH GFR    VITAMIN D 25 Hydroxy (Vit-D Deficiency, Fractures)   Some mild decline in her renal function continue to monitor, keep blood pressure well controlled, avoid NSAIDs and remain hydrated  7. Proteinuria, unspecified type  R80.9    Monitoring  8. Age-related osteoporosis without current pathological fracture  W40.9 COMPLETE METABOLIC PANEL WITH GFR    VITAMIN D 25 Hydroxy (Vit-D Deficiency, Fractures)    Ambulatory referral to Endocrinology   DEXA has been done several times showed improvement with 5 years of Fosamax treatment needs consult for transition to Prolia injections  9. Abnormality of gait and mobility  R26.9     Continued abnormality of gait and mobility she uses rolling walker is not doing therapy right now  10. Limited mobility  Z74.09    rolling walker  11. Risk for falls  Z91.81    Discussed avoiding falls  12. Idiopathic chronic gout without tophus, unspecified site  M1A.73Z3 COMPLETE METABOLIC PANEL WITH GFR    Uric acid   on allopurinol and watching diet triggers, no recent flares, monitoring uric acid and renal function  13. Vitamin D deficiency  G99.2 COMPLETE METABOLIC PANEL WITH GFR    VITAMIN D 25 Hydroxy (Vit-D Deficiency, Fractures)   monitoring with CKD and osteoporosis  14. Right-sided uninodular goiter  E04.1 Ambulatory referral to Endocrinology   very large, sometimes hoarse voice, no dysphagia or phonation changes, was seeing endo and following labs [place] for monitoring - [place] for 20+ yrs  15. Constipation, unspecified constipation type  K59.00    Discussed that I do not prefer any stimulant laxatives and she should instead try MiraLAX or docusate high-fiber diet with plenty of fluids  16. Encounter for medication monitoring  Z51.81 CBC with Differential/Platelet    COMPLETE METABOLIC PANEL WITH GFR    Lipid panel    Hemoglobin A1c  TSH    Uric acid     Return in about 6 months (around 10/06/2020) for Routine follow-up.   Delsa Grana, PA-C 04/08/20 11:07 AM

## 2020-04-08 NOTE — Patient Instructions (Signed)
We will refer you to the specialists to see if they can follow up on your goiter and consult on your osteoporosis and prolia injections - you should hear from [facility] clinic or the referral team about this in the next month  Try miralax or dolcusate (stool softeners) for bowels and try to avoid laxative stimulants

## 2020-04-09 LAB — COMPLETE METABOLIC PANEL WITH GFR
AG Ratio: 1.4 (calc) (ref 1.0–2.5)
ALT: 9 U/L (ref 6–29)
AST: 17 U/L (ref 10–35)
Albumin: 4.1 g/dL (ref 3.6–5.1)
Alkaline phosphatase (APISO): 52 U/L (ref 37–153)
BUN/Creatinine Ratio: 23 (calc) — ABNORMAL HIGH (ref 6–22)
BUN: 22 mg/dL (ref 7–25)
CO2: 24 mmol/L (ref 20–32)
Calcium: 10 mg/dL (ref 8.6–10.4)
Chloride: 103 mmol/L (ref 98–110)
Creat: 0.96 mg/dL — ABNORMAL HIGH (ref 0.60–0.93)
GFR, Est African American: 66 mL/min/{1.73_m2} (ref >=60)
GFR, Est Non African American: 57 mL/min/{1.73_m2} — ABNORMAL LOW (ref >=60)
Globulin: 3 g/dL (calc) (ref 1.9–3.7)
Glucose, Bld: 156 mg/dL — ABNORMAL HIGH (ref 65–99)
Potassium: 4.3 mmol/L (ref 3.5–5.3)
Sodium: 140 mmol/L (ref 135–146)
Total Bilirubin: 1 mg/dL (ref 0.2–1.2)
Total Protein: 7.1 g/dL (ref 6.1–8.1)

## 2020-04-09 LAB — CBC WITH DIFFERENTIAL/PLATELET
Absolute Monocytes: 795 cells/uL (ref 200–950)
Basophils Absolute: 53 cells/uL (ref 0–200)
Basophils Relative: 0.5 %
Eosinophils Absolute: 307 cells/uL (ref 15–500)
Eosinophils Relative: 2.9 %
HCT: 39.9 % (ref 35.0–45.0)
Hemoglobin: 12.6 g/dL (ref 11.7–15.5)
Lymphs Abs: 2756 cells/uL (ref 850–3900)
MCH: 26.5 pg — ABNORMAL LOW (ref 27.0–33.0)
MCHC: 31.6 g/dL — ABNORMAL LOW (ref 32.0–36.0)
MCV: 84 fL (ref 80.0–100.0)
MPV: 11.6 fL (ref 7.5–12.5)
Monocytes Relative: 7.5 %
Neutro Abs: 6689 cells/uL (ref 1500–7800)
Neutrophils Relative %: 63.1 %
Platelets: 218 10*3/uL (ref 140–400)
RBC: 4.75 10*6/uL (ref 3.80–5.10)
RDW: 13.4 % (ref 11.0–15.0)
Total Lymphocyte: 26 %
WBC: 10.6 10*3/uL (ref 3.8–10.8)

## 2020-04-09 LAB — LIPID PANEL
Cholesterol: 108 mg/dL (ref <200)
HDL: 45 mg/dL — ABNORMAL LOW (ref >=50)
LDL Cholesterol (Calc): 44 mg/dL (calc)
Non-HDL Cholesterol (Calc): 63 mg/dL (calc) (ref <130)
Total CHOL/HDL Ratio: 2.4 (calc) (ref <5.0)
Triglycerides: 105 mg/dL (ref <150)

## 2020-04-09 LAB — TSH: TSH: 2.83 mIU/L (ref 0.40–4.50)

## 2020-04-09 LAB — HEMOGLOBIN A1C
Hgb A1c MFr Bld: 5.7 % of total Hgb — ABNORMAL HIGH (ref <5.7)
Mean Plasma Glucose: 117 mg/dL
eAG (mmol/L): 6.5 mmol/L

## 2020-04-09 LAB — VITAMIN D 25 HYDROXY (VIT D DEFICIENCY, FRACTURES): Vit D, 25-Hydroxy: 35 ng/mL (ref 30–100)

## 2020-04-09 LAB — URIC ACID: Uric Acid, Serum: 6 mg/dL (ref 2.5–7.0)

## 2020-04-11 ENCOUNTER — Encounter: Payer: Self-pay | Admitting: Family Medicine

## 2020-04-12 ENCOUNTER — Telehealth: Payer: Self-pay

## 2020-04-12 MED ORDER — POLYETHYLENE GLYCOL 3350 17 GM/SCOOP PO POWD: 8.5000 g | Freq: Every day | ORAL | 1 refills | Status: DC | PRN

## 2020-04-12 NOTE — Telephone Encounter (Signed)
Patient would like script for constipation medication called to m pharmacy. She stated you did not want her taking [facility] Sen[facility]

## 2020-04-12 NOTE — Telephone Encounter (Signed)
Copied from [place] [phone]. Topic: Conservator, museum/gallery Patient (Clinic Use ONLY) >> Apr 12, 2020  2:34 PM Lennox Solders wrote: Reason for CRM: Pt is returning helen call concerning blood work results

## 2020-04-13 NOTE — Telephone Encounter (Signed)
Patient notified

## 2020-05-22 ENCOUNTER — Other Ambulatory Visit: Payer: Self-pay

## 2020-05-22 ENCOUNTER — Emergency Department
Admission: EM | Admit: 2020-05-22 | Discharge: 2020-05-23 | Disposition: A | Discharge status: Home / Self Care | Payer: Medicare PPO | Attending: Emergency Medicine | Admitting: Emergency Medicine

## 2020-05-22 DIAGNOSIS — K2980 Duodenitis without bleeding: Secondary | ICD-10-CM | POA: Insufficient documentation

## 2020-05-22 DIAGNOSIS — I10 Essential (primary) hypertension: Secondary | ICD-10-CM | POA: Diagnosis not present

## 2020-05-22 DIAGNOSIS — E039 Hypothyroidism, unspecified: Secondary | ICD-10-CM | POA: Insufficient documentation

## 2020-05-22 DIAGNOSIS — R911 Solitary pulmonary nodule: Secondary | ICD-10-CM

## 2020-05-22 DIAGNOSIS — Z79899 Other long term (current) drug therapy: Secondary | ICD-10-CM | POA: Diagnosis not present

## 2020-05-22 DIAGNOSIS — R1084 Generalized abdominal pain: Secondary | ICD-10-CM | POA: Diagnosis present

## 2020-05-22 DIAGNOSIS — Z7982 Long term (current) use of aspirin: Secondary | ICD-10-CM | POA: Diagnosis not present

## 2020-05-22 DIAGNOSIS — K8020 Calculus of gallbladder without cholecystitis without obstruction: Secondary | ICD-10-CM | POA: Diagnosis not present

## 2020-05-22 LAB — CBC
HCT: 39.6 % (ref 36.0–46.0)
Hemoglobin: 12.4 g/dL (ref 12.0–15.0)
MCH: 26.8 pg (ref 26.0–34.0)
MCHC: 31.3 g/dL (ref 30.0–36.0)
MCV: 85.7 fL (ref 80.0–100.0)
Platelets: 248 10*3/uL (ref 150–400)
RBC: 4.62 MIL/uL (ref 3.87–5.11)
RDW: 14.4 % (ref 11.5–15.5)
WBC: 15.8 10*3/uL — ABNORMAL HIGH (ref 4.0–10.5)
nRBC: 0 % (ref 0.0–0.2)

## 2020-05-22 LAB — COMPREHENSIVE METABOLIC PANEL
ALT: 15 U/L (ref 0–44)
AST: 28 U/L (ref 15–41)
Albumin: 4.1 g/dL (ref 3.5–5.0)
Alkaline Phosphatase: 55 U/L (ref 38–126)
Anion gap: 16 — ABNORMAL HIGH (ref 5–15)
BUN: 21 mg/dL (ref 8–23)
CO2: 19 mmol/L — ABNORMAL LOW (ref 22–32)
Calcium: 10 mg/dL (ref 8.9–10.3)
Chloride: 103 mmol/L (ref 98–111)
Creatinine, Ser: 1.04 mg/dL — ABNORMAL HIGH (ref 0.44–1.00)
GFR, Estimated: 55 mL/min — ABNORMAL LOW (ref >60)
Glucose, Bld: 181 mg/dL — ABNORMAL HIGH (ref 70–99)
Potassium: 3.6 mmol/L (ref 3.5–5.1)
Sodium: 138 mmol/L (ref 135–145)
Total Bilirubin: 1.2 mg/dL (ref 0.3–1.2)
Total Protein: 7.9 g/dL (ref 6.5–8.1)

## 2020-05-22 NOTE — ED Triage Notes (Signed)
Pt complains of lower abd pain since yesterday. Pt denies fever, hematuria, dysuria, nausea, vomiting, diarrhea. Pt appears in no acute distress.

## 2020-05-22 NOTE — ED Triage Notes (Signed)
First Nurse: Pt BIB [facility] due to abdominal pain for the last several days. Pt denies nausea, vomiting and diarrhea, but states a decreased appetite  EMS vitals  HR: 84 O2: 99% room air BP: 110/palp Ambulatory with assistance with EMS.

## 2020-05-23 ENCOUNTER — Emergency Department: Payer: Medicare PPO

## 2020-05-23 LAB — URINALYSIS, COMPLETE (UACMP) WITH MICROSCOPIC
Bacteria, UA: NONE SEEN
Bilirubin Urine: NEGATIVE
Glucose, UA: NEGATIVE mg/dL
Hgb urine dipstick: NEGATIVE
Ketones, ur: NEGATIVE mg/dL
Leukocytes,Ua: NEGATIVE
Nitrite: NEGATIVE
Protein, ur: NEGATIVE mg/dL
Specific Gravity, Urine: >1.046 — ABNORMAL HIGH (ref 1.005–1.030)
pH: 5 (ref 5.0–8.0)

## 2020-05-23 LAB — LIPASE, BLOOD: Lipase: 42 U/L (ref 11–51)

## 2020-05-23 MED ORDER — PANTOPRAZOLE SODIUM 40 MG PO TBEC: 40.0000 mg | DELAYED_RELEASE_TABLET | Freq: Every day | ORAL | 1 refills | Status: DC

## 2020-05-23 MED ORDER — FENTANYL CITRATE (PF) 100 MCG/2ML IJ SOLN
50.0000 ug | Freq: Once | INTRAMUSCULAR | Status: AC
  Administered 2020-05-23: 50 ug via INTRAVENOUS
  Filled 2020-05-23: qty 2

## 2020-05-23 MED ORDER — FENTANYL CITRATE (PF) 100 MCG/2ML IJ SOLN
50.0000 ug | Freq: Once | INTRAMUSCULAR | Status: AC
Start: 2020-05-23 — End: 2020-05-23
  Administered 2020-05-23: 50 ug via INTRAVENOUS
  Filled 2020-05-23: qty 2

## 2020-05-23 MED ORDER — ONDANSETRON 4 MG PO TBDP: 4.0000 mg | ORAL_TABLET | Freq: Four times a day (QID) | ORAL | 0 refills | Status: DC | PRN

## 2020-05-23 MED ORDER — PANTOPRAZOLE SODIUM 40 MG IV SOLR
40.0000 mg | Freq: Once | INTRAVENOUS | Status: AC
  Administered 2020-05-23: 40 mg via INTRAVENOUS
  Filled 2020-05-23: qty 40

## 2020-05-23 MED ORDER — IOHEXOL 300 MG/ML  SOLN
100.0000 mL | Freq: Once | INTRAMUSCULAR | Status: AC | PRN
  Administered 2020-05-23: 100 mL via INTRAVENOUS

## 2020-05-23 MED ORDER — ONDANSETRON HCL 4 MG/2ML IJ SOLN
4.0000 mg | Freq: Once | INTRAMUSCULAR | Status: AC
  Administered 2020-05-23: 4 mg via INTRAVENOUS
  Filled 2020-05-23: qty 2

## 2020-05-23 MED ORDER — IOHEXOL 9 MG/ML PO SOLN
1000.0000 mL | Freq: Once | ORAL | Status: AC | PRN
  Administered 2020-05-23: 1000 mL via ORAL

## 2020-05-23 MED ORDER — DICYCLOMINE HCL 20 MG PO TABS: 20.0000 mg | ORAL_TABLET | Freq: Three times a day (TID) | ORAL | 0 refills | Status: DC | PRN

## 2020-05-23 NOTE — ED Provider Notes (Signed)
[facility] Emergency Department Provider Note  ____________________________________________   Event Date/Time   First MD Initiated Contact with Patient 05/23/20 0003     (approximate)  I have reviewed the triage vital signs and the nursing notes.   HISTORY  Chief Complaint Abdominal Pain    HPI Tracy Bolton is a 80 y.o. female with history of hypertension, hyperlipidemia, obesity who presents to the emergency department with complaints of diffuse mid and lower abdominal pain that she describes as a burning that waxes and wanes for the past 2 days.  States pain is worse with sitting upright.  No other aggravating or alleviating factors.  No radiation.  Denies any fevers, chills, nausea, vomiting, diarrhea, dysuria, hematuria, vaginal bleeding or discharge.  No previous abdominal surgeries.  Patient presents here by EMS.        Past Medical History:  Diagnosis Date  . Arthritis   . Gout, chronic   . Hyperlipidemia   . Hypertension   . Osteoporosis   . Thyroid disease     Patient Active Problem List   Diagnosis Date Noted  . Osteopenia 07/02/2019  . Limited mobility 03/03/2019  . Abnormality of gait and mobility 03/03/2019  . Risk for falls 03/03/2019  . Urine test positive for microalbuminuria 06/06/2017  . Full code status 05/21/2017  . Preventative health care 05/21/2017  . Elevated serum glutamic pyruvic transaminase (SGPT) level 05/19/2016  . Callus of foot 05/18/2016  . Urine abnormality 11/14/2015  . Encounter for medication monitoring 11/14/2015  . Hyperlipidemia 11/14/2015  . Pre-diabetes 05/18/2015  . OP (osteoporosis) 09/06/2014  . Chronic gouty arthritis 09/06/2014  . Obesity, Class III, BMI 40-49.9 (morbid obesity) ([place]) 09/06/2014  . 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 Hypertension goal BP (blood pressure) < 140/90 07/12/2014  . Weakness of both lower extremities 07/12/2014  . Dry skin 07/12/2014  .  Right-sided uninodular goiter 07/12/2014  . Cyst of eye 07/12/2014  . Constipation, chronic 07/12/2014  . Subclinical hypothyroidism 02/06/2013    Past Surgical History:  Procedure Laterality Date  . CATARACT EXTRACTION, BILATERAL  02/2018  . COLONOSCOPY  2016  . LUNG SURGERY Right 2004   biopsy normal    Prior to Admission medications   Medication Sig Start Date End Date Taking? Authorizing Provider  dicyclomine (BENTYL) 20 MG tablet Take 1 tablet (20 mg total) by mouth every 8 (eight) hours as needed for spasms (Abdominal cramping). 05/23/20  Yes Ward, Cyril Mourning N, DO  ondansetron (ZOFRAN ODT) 4 MG disintegrating tablet Take 1 tablet (4 mg total) by mouth every 6 (six) hours as needed for nausea or vomiting. 05/23/20  Yes Ward, Cyril Mourning N, DO  pantoprazole (PROTONIX) 40 MG tablet Take 1 tablet (40 mg total) by mouth daily. 05/23/20 05/23/21 Yes Ward, Delice Bison, DO  allopurinol (ZYLOPRIM) 100 MG tablet TAKE 1 TABLET BY MOUTH EVERY DAY 09/29/19   Delsa Grana, PA-C  amLODipine (NORVASC) 10 MG tablet TAKE 1 TABLET BY MOUTH EVERY DAY 09/29/19   Delsa Grana, PA-C  aspirin 81 MG tablet Take 81 mg by mouth. 03/13/06   [provider]  atenolol (TENORMIN) 50 MG tablet TAKE 1 TABLET BY MOUTH EVERY DAY 09/29/19   Delsa Grana, PA-C  atorvastatin (LIPITOR) 10 MG tablet TAKE 1 TABLET BY MOUTH EVERYDAY AT BEDTIME 08/24/19   Delsa Grana, PA-C  [employer] SENNA 8.6 MG tablet TAKE 1 TABLET (8.6 MG TOTAL) BY MOUTH 2 (TWO) TIMES DAILY AS NEEDED FOR CONSTIPATION. Patient not  taking: Reported on 04/08/2020 04/29/19   Delsa Grana, PA-C  metFORMIN (GLUCOPHAGE-XR) 500 MG 24 hr tablet TAKE 2 TABLETS BY MOUTH EVERY DAY 09/29/19   Delsa Grana, PA-C  Multiple Vitamin (MULTI-VITAMINS) TABS Take by mouth. 11/14/07   [provider]  nystatin (MYCOSTATIN/NYSTOP) powder APPLY TO AFFECTED AREA OF SKIN 3X A DAY AS NEEDED IN A SUFFICIENT AMOUNT 01/27/20   Delsa Grana, PA-C  polyethylene glycol powder (GLYCOLAX/MIRALAX) 17  GM/SCOOP powder Take 8.5-17 g by mouth daily as needed for moderate constipation. (take half a cap to one full cap full dissolved in clear liquid once daily prn for constipation) 04/12/20   Delsa Grana, PA-C    Allergies Patient has no known allergies.  Family History  Problem Relation Age of Onset  . Bone cancer Father 38  . Thyroid disease Sister   . Cancer Mother 84       brain tumor  . Breast cancer Neg Hx     Social History Social History   Tobacco Use  . Smoking status: Never Smoker  . Smokeless tobacco: Never Used  Vaping Use  . Vaping Use: Never used  Substance Use Topics  . Alcohol use: No    Alcohol/week: 0.0 standard drinks  . Drug use: No    Review of Systems Constitutional: No fever. Eyes: No visual changes. ENT: No sore throat. Cardiovascular: Denies chest pain. Respiratory: Denies shortness of breath. Gastrointestinal: No nausea, vomiting, diarrhea. Genitourinary: Negative for dysuria. Musculoskeletal: Negative for back pain. Skin: Negative for rash. Neurological: Negative for focal weakness or numbness.  ____________________________________________   PHYSICAL EXAM:  VITAL SIGNS: ED Triage Vitals  Enc Vitals Group     BP 05/22/20 2208 114/66     Pulse Rate 05/22/20 2208 80     Resp 05/22/20 2208 16     Temp 05/22/20 2208 98.3 F (36.8 C)     Temp Source 05/22/20 2208 Oral     SpO2 05/22/20 2208 99 %     Weight 05/22/20 2209 226 lb (102.5 kg)     Height 05/22/20 2209 5\' 3"  (1.6 m)     Head Circumference --      Peak Flow --      Pain Score 05/22/20 2209 7     Pain Loc --      Pain Edu? --      Excl. in [place]? --    CONSTITUTIONAL: Alert and oriented and responds appropriately to questions.  Obese.  In no distress. HEAD: Normocephalic EYES: Conjunctivae clear, pupils appear equal, EOM appear intact ENT: normal nose; moist mucous membranes NECK: Supple, normal ROM CARD: RRR; S1 and S2 appreciated; no murmurs, no clicks, no rubs, no  gallops RESP: Normal chest excursion without splinting or tachypnea; breath sounds clear and equal bilaterally; no wheezes, no rhonchi, no rales, no hypoxia or respiratory distress, speaking full sentences ABD/GI: Normal bowel sounds; non-distended; soft, minimally tender throughout the abdomen, no rebound, no guarding, no peritoneal signs, no hepatosplenomegaly BACK: The back appears normal EXT: Normal ROM in all joints; no deformity noted, no edema; no cyanosis SKIN: Normal color for age and race; warm; no rash on exposed skin NEURO: Moves all extremities equally PSYCH: The patient's mood and manner are appropriate.  ____________________________________________   LABS (all labs ordered are listed, but only abnormal results are displayed)  Labs Reviewed  COMPREHENSIVE METABOLIC PANEL - Abnormal; Notable for the following components:      Result Value   CO2 19 (*)  Glucose, Bld 181 (*)    Creatinine, Ser 1.04 (*)    GFR, Estimated 55 (*)    Anion gap 16 (*)    All other components within normal limits  CBC - Abnormal; Notable for the following components:   WBC 15.8 (*)    All other components within normal limits  URINALYSIS, COMPLETE (UACMP) WITH MICROSCOPIC - Abnormal; Notable for the following components:   Color, Urine YELLOW (*)    APPearance HAZY (*)    Specific Gravity, Urine >1.046 (*)    All other components within normal limits  LIPASE, BLOOD   ____________________________________________  EKG  None ____________________________________________  RADIOLOGY I, Kristen Ward, personally viewed and evaluated these images (plain radiographs) as part of my medical decision making, as well as reviewing the written report by the radiologist.  ED MD interpretation: CT shows cholelithiasis without cholecystitis.  No bowel obstruction.  Official radiology report(s): CT ABDOMEN PELVIS W CONTRAST  Result Date: 05/23/2020 CLINICAL DATA:  Lower abdominal pain EXAM: CT  ABDOMEN AND PELVIS WITH CONTRAST TECHNIQUE: Multidetector CT imaging of the abdomen and pelvis was performed using the standard protocol following bolus administration of intravenous contrast. CONTRAST:  130mL OMNIPAQUE IOHEXOL 300 MG/ML  SOLN COMPARISON:  None. FINDINGS: Lower chest: The visualized heart size within normal limits. Mitral valve calcifications are noted. No pericardial fluid/thickening. No hiatal hernia. Calcified lymph nodes are noted within the mediastinum. There is an ill-defined 8 mm pulmonary nodule seen within the right lung base. Hepatobiliary: The liver is normal in density without focal abnormality.The main portal vein is patent. Multiple calcified gallstones are seen layering throughout the gallbladder with sludge. No intrahepatic or common bile duct dilatation. Pancreas: At the level of the pancreatic head there is ill-defined hypodensity best seen on series 2, image 39 which abuts the second portion of the duodenum. There are coarse calcifications. The pancreatic duct at this level appears to be nonvisualized. The remainder of the pancreas is normal in appearance. Spleen: Normal in size without focal abnormality. Adrenals/Urinary Tract: Both adrenal glands appear normal. The kidneys and collecting system appear normal without evidence of urinary tract calculus or hydronephrosis. Bladder is unremarkable. Stomach/Bowel: The stomach is normal in appearance. At the level of the second portion of the duodenum there appears to be wall thickening and narrowing with a duodenal diverticulum. There is question of mild fat stranding changes. The remainder of the small bowel is unremarkable. There is scattered colonic diverticula without diverticulitis. The appendix is unremarkable. Vascular/Lymphatic: There are no enlarged mesenteric, retroperitoneal, or pelvic lymph nodes. Scattered aortic atherosclerotic calcifications are seen without aneurysmal dilatation. Reproductive: The uterus and adnexa are  unremarkable. Calcifications seen in the posterior uterine wall. Other: No evidence of abdominal wall mass. Small fat containing anterior umbilical hernia seen. Musculoskeletal: No acute or significant osseous findings. Advanced bilateral femoroacetabular joint osteoarthritis is seen with superior joint space loss and osteophyte formation. IMPRESSION: 1. Ill-defined hypodense area seen within the pancreatic head which appears to abut the duodenum and adjacent to coarse calcifications. This is nonspecific, could be related to chronic pancreatitis versus underlying and pancreatic neoplasm. Would recommend MRI with contrast/EUS for further evaluation. 2. Duodenal diverticula with focal wall thickening/narrowing which could be due to mild duodenitis. 3. Cholelithiasis 4. Diverticulosis without diverticulitis. 5. 8 mm pulmonary nodule within the right lung base. Non-contrast chest CT at 6-12 months is recommended. If the nodule is stable at time of repeat CT, then future CT at 18-24 months (from today's scan) is considered  optional for low-risk patients, but is recommended for high-risk patients. This recommendation follows the consensus statement: Guidelines for Management of Incidental Pulmonary Nodules Detected on CT Images: From the Fleischner Society 2017; Radiology 2017; 284:228-243. 6.  Aortic Atherosclerosis (ICD10-I70.0). Electronically Signed   By: Prudencio Pair M.D.   On: 05/23/2020 01:40    ____________________________________________   PROCEDURES  Procedure(s) performed (including Critical Care):  Procedures    ____________________________________________   INITIAL IMPRESSION / ASSESSMENT AND PLAN / ED COURSE  As part of my medical decision making, I reviewed the following data within the [facility] notes reviewed and incorporated, labs and CT imaging reviewed, old chart reviewed and Notes from prior ED visits         Patient here with diffuse abdominal pain.   Labs obtained in triage show leukocytosis of 15,000.  LFTs, creatinine, lipase normal.  Urinalysis pending.  Differential includes colitis, diverticulitis, appendicitis, UTI, pyelonephritis, kidney stone.  She reports pain is mostly in the mid to lower abdomen.  Low suspicion for ovarian torsion, TOA, PID.  Will obtain CT of the abdomen and pelvis.  Will give pain medication here.  ED PROGRESS  CT of the abdomen shows ill-defined hypodense area within the pancreatic head that could be due to chronic pancreatitis versus underlying pancreatic neoplasm.  Lipase today is normal.  Recommended outpatient follow-up with her PCP.  She also has diverticuli without diverticulitis.  She has cholelithiasis without signs of cholecystitis.  She has signs of mild duodenitis.  Will give IV Protonix.  Patient has a right 8 mm pulmonary nodule.  Recommended outpatient follow-up.  Will refer to pulmonary nodule clinic.  4:50 AM  Pt reports her pain improve the most after IV Protonix.  Urine shows some red blood cells but no other sign of infection today.  No bacteria.  She has been able to tolerate p.o. here.  I feel she is safe for discharge home.  Will discharge with prescriptions of Bentyl, Protonix and Zofran.  Will give outpatient GI as well as surgical follow-up.  Recommended diet changes.  Patient comfortable with this plan.  At this time, I do not feel there is any life-threatening condition present. I have reviewed, interpreted and discussed all results (EKG, imaging, lab, urine as appropriate) and exam findings with patient/family. I have reviewed nursing notes and appropriate previous records.  I feel the patient is safe to be discharged home without further emergent workup and can continue workup as an outpatient as needed. Discussed usual and customary return precautions. Patient/family verbalize understanding and are comfortable with this plan.  Outpatient follow-up has been provided as needed. All questions  have been answered.  ____________________________________________   FINAL CLINICAL IMPRESSION(S) / ED DIAGNOSES  Final diagnoses:  Cholelithiasis without cholecystitis  Duodenitis  Pulmonary nodule     ED Discharge Orders         Ordered    AMB  Referral to Pulmonary Nodule Clinic        05/23/20 0324    ondansetron (ZOFRAN ODT) 4 MG disintegrating tablet  Every 6 hours PRN        05/23/20 0454    dicyclomine (BENTYL) 20 MG tablet  Every 8 hours PRN        05/23/20 0454    pantoprazole (PROTONIX) 40 MG tablet  Daily        05/23/20 0454          *Please note:  Tracy Bolton was evaluated in Emergency  Department on 05/23/2020 for the symptoms described in the history of present illness. She was evaluated in the context of the global COVID-19 pandemic, which necessitated consideration that the patient might be at risk for infection with the SARS-CoV-2 virus that causes COVID-19. Institutional protocols and algorithms that pertain to the evaluation of patients at risk for COVID-19 are in a state of rapid change based on information released by regulatory bodies including the CDC and federal and state organizations. These policies and algorithms were followed during the patient's care in the ED.  Some ED evaluations and interventions may be delayed as a result of limited staffing during and the pandemic.*   Note:  This document was prepared using Dragon voice recognition software and may include unintentional dictation errors.   Ward, Delice Bison, DO 05/23/20 [phone]

## 2020-05-23 NOTE — Discharge Instructions (Signed)
You were found to have gallstones but no sign of gallbladder infection.  I recommend a low-fat diet and follow-up with general surgery as an outpatient to discuss possible elective gallbladder surgery if symptoms continue.  We have prescribed you with Bentyl and Zofran to help with pain and nausea that you may take as needed.  You may also take over-the-counter Tylenol 1000 mg every 6 hours as needed for pain.  If you have worsening right upper quadrant abdominal pain, fever of 100.4 or higher, vomiting and cannot stop, please return to the emergency department.  You also had signs of duodenitis.  We are discharging you home on Protonix to take once daily.  We recommend follow-up with gastroenterology as an outpatient.  I recommend avoiding spicy, acidic, greasy, fatty meals, alcohol.    You were also found to have a right pulmonary nodule.  This was an incidental finding but we recommend follow-up with the pulmonary nodule clinic.

## 2020-05-24 ENCOUNTER — Telehealth: Payer: Self-pay | Admitting: *Deleted

## 2020-05-24 NOTE — Telephone Encounter (Signed)
Referral received for pt to follow up in the lung nodule clinic. Spoke with pt and introduced to the lung nodule clinic. Informed will call back once her appts for CT scan and follow up have been scheduled. Contact info given and instructed to call with any questions. Pt verbalized understanding.

## 2020-05-27 ENCOUNTER — Encounter: Payer: Self-pay | Admitting: Surgery

## 2020-05-27 ENCOUNTER — Ambulatory Visit (INDEPENDENT_AMBULATORY_CARE_PROVIDER_SITE_OTHER): Payer: Medicare PPO | Admitting: Surgery

## 2020-05-27 ENCOUNTER — Other Ambulatory Visit: Payer: Self-pay

## 2020-05-27 VITALS — BP 102/66 | HR 90 | Temp 98.1°F | Ht 63.0 in | Wt 211.4 lb

## 2020-05-27 DIAGNOSIS — R1033 Periumbilical pain: Secondary | ICD-10-CM

## 2020-05-27 NOTE — Progress Notes (Signed)
05/27/2020  Reason for Visit:  Abdominal pain  History of Present Illness: Tracy Bolton is a 80 y.o. female presenting for evaluation of abdominal pain.  She presented to the ED on 2/27 with mid-abdominal and lower abdominal pain.  The pain had started two days prior and was intermittent in nature, with some spikes in pain, and then some lower baseline degree of discomfort.  The pain was mostly in the mid abdomen, sometimes in lower abdomen, sometimes in epigastric area, sometimes in RUQ area.  Denies having any nausea or vomiting associated with the pain, but she thinks the pain would start after eating.  In the ER, per MD notes, her pain got better after getting Protonix, and the patient today confirms that.  She had a CT scan of abdomen and pelvis in the ER and this showed some pancreatic head changes and calcifications, duodenal diverticula with some mild thickening, cholelithiasis, and also a small fat containing umbilical hernia.  Given the cholelithiasis findings, she was referred by the ED for evaluation.  I have personally seen the images.    Today, the patient again reports her pain is in the mid abdomen.  Denies again nausea, vomiting, constipation, and diarrhea.  She had a history of HTN, DM, gout, CKD3, HLP, osteoporosis, and a large goiter.  No abdominal surgeries.  From the goiter standpoint, she's due to see a new endocrinologist in April.  Despite me offering a referral to Dr. Celine Ahr for her thyroid, she wants to wait until she sees the new endocrinologist.  Past Medical History: Past Medical History:  Diagnosis Date  . Arthritis   . Gout, chronic   . Hyperlipidemia   . Hypertension   . Osteoporosis   . Thyroid disease      Past Surgical History: Past Surgical History:  Procedure Laterality Date  . CATARACT EXTRACTION, BILATERAL  02/2018  . COLONOSCOPY  2016  . LUNG SURGERY Right 2004   biopsy normal    Home Medications: Prior to Admission medications   Medication  Sig Start Date End Date Taking? Authorizing Provider  allopurinol (ZYLOPRIM) 100 MG tablet TAKE 1 TABLET BY MOUTH EVERY DAY 09/29/19  Yes Delsa Grana, PA-C  amLODipine (NORVASC) 10 MG tablet TAKE 1 TABLET BY MOUTH EVERY DAY 09/29/19  Yes Delsa Grana, PA-C  aspirin 81 MG tablet Take 81 mg by mouth. 03/13/06  Yes [provider]  atenolol (TENORMIN) 50 MG tablet TAKE 1 TABLET BY MOUTH EVERY DAY 09/29/19  Yes Delsa Grana, PA-C  atorvastatin (LIPITOR) 10 MG tablet TAKE 1 TABLET BY MOUTH EVERYDAY AT BEDTIME 08/24/19  Yes Tapia, Leisa, PA-C  [facility]S SENNA 8.6 MG tablet TAKE 1 TABLET (8.6 MG TOTAL) BY MOUTH 2 (TWO) TIMES DAILY AS NEEDED FOR CONSTIPATION. 04/29/19  Yes Delsa Grana, PA-C  dicyclomine (BENTYL) 20 MG tablet Take 1 tablet (20 mg total) by mouth every 8 (eight) hours as needed for spasms (Abdominal cramping). 05/23/20  Yes Ward, Delice Bison, DO  metFORMIN (GLUCOPHAGE-XR) 500 MG 24 hr tablet TAKE 2 TABLETS BY MOUTH EVERY DAY 09/29/19  Yes Delsa Grana, PA-C  Multiple Vitamin (MULTI-VITAMINS) TABS Take by mouth. 11/14/07  Yes [provider]  nystatin (MYCOSTATIN/NYSTOP) powder APPLY TO AFFECTED AREA OF SKIN 3X A DAY AS NEEDED IN A SUFFICIENT AMOUNT 01/27/20  Yes Delsa Grana, PA-C  ondansetron (ZOFRAN ODT) 4 MG disintegrating tablet Take 1 tablet (4 mg total) by mouth every 6 (six) hours as needed for nausea or vomiting. 05/23/20  Yes Ward, Cyril Mourning  N, DO  pantoprazole (PROTONIX) 40 MG tablet Take 1 tablet (40 mg total) by mouth daily. 05/23/20 05/23/21 Yes Ward, Delice Bison, DO  polyethylene glycol powder (GLYCOLAX/MIRALAX) 17 GM/SCOOP powder Take 8.5-17 g by mouth daily as needed for moderate constipation. (take half a cap to one full cap full dissolved in clear liquid once daily prn for constipation) 04/12/20  Yes Delsa Grana, PA-C    Allergies: No Known Allergies  Social History:  reports that she has never smoked. She has never used smokeless tobacco. She reports that she does not drink  alcohol and does not use drugs.   Family History: Family History  Problem Relation Age of Onset  . Bone cancer Father 15  . Thyroid disease Sister   . Cancer Mother 79       brain tumor  . Breast cancer Neg Hx     Review of Systems: Review of Systems  Constitutional: Negative for chills and fever.  Respiratory: Negative for shortness of breath.   Cardiovascular: Negative for chest pain.  Gastrointestinal: Positive for abdominal pain. Negative for constipation, diarrhea, nausea and vomiting.  Musculoskeletal: Negative for myalgias.    Physical Exam BP 102/66   Pulse 90   Temp 98.1 F (36.7 C) (Oral)   Ht _0  (1.6 m)   Wt 211 lb 6.4 oz (95.9 kg)   SpO2 96%   BMI 37.45 kg/m  CONSTITUTIONAL: No acute distress, uses a walker. NECK: Large goiter mostly over the right thyroid lobe.  RESPIRATORY:  Normal respiratory effort without pathologic use of accessory muscles. CARDIOVASCULAR:  Regular rhythm and rate. GI: The abdomen is soft, obese, non-distended.  She has discomfort when palpating in the periumbilical area.  She does have a small umbilical hernia, with some discomfort when trying to palpate the extent of the defect size, which is about 2 cm.  NEUROLOGIC:  Motor and sensation is grossly normal.  Cranial nerves are grossly intact. PSYCH:  Alert and oriented to person, place and time. Affect is normal.  Laboratory Analysis: Labs from 05/22/20: Na 138, K 3.6, Cl 103, CO2 19, BUN 21, Cr 1.04.  Total bili 1.2, AST 28, ALT 15, Alk Phos 55, lipase 42.  WBC 15.8, Hgb 12.4, Hct 39.6, Plt 248  Labs from 04/08/20: TSH 2.83, HgA1C 5.7  Imaging: CT scan abdomen/pelvis 05/23/20: IMPRESSION: 1. Ill-defined hypodense area seen within the pancreatic head which appears to abut the duodenum and adjacent to coarse calcifications. This is nonspecific, could be related to chronic pancreatitis versus underlying and pancreatic neoplasm. Would recommend MRI with contrast/EUS for further  evaluation. 2. Duodenal diverticula with focal wall thickening/narrowing which could be due to mild duodenitis. 3. Cholelithiasis 4. Diverticulosis without diverticulitis. 5. 8 mm pulmonary nodule within the right lung base. Non-contrast chest CT at 6-12 months is recommended. If the nodule is stable at time of repeat CT, then future CT at 18-24 months (from today's scan) is considered optional for low-risk patients, but is recommended for high-risk patients. This recommendation follows the consensus statement: Guidelines for Management of Incidental Pulmonary Nodules Detected on CT Images: From the Fleischner Society 2017; Radiology 2017; 284:228-243. 6.  Aortic Atherosclerosis (ICD10-I70.0).  Assessment and Plan: This is a 80 y.o. female with abdominal pain.  --Discussed with the patient the findings on her CT scan.  Her pain got better with Protonix, which would point away from her gallbladder being the issue, and if she truly has duodenitis, this may be the reason for her pain.  On  exam today, the pain is worst in the umbilical area, where she has a small fat containing hernia on her CT scan.  This could also be the source of her pain. --Given the multiple findings and different symptoms and locations that the patient has expressed, would consider a trial of Protonix first which was prescribed in the ED.  This would help [place] differentiate some of her symptoms.   --She will follow up in 1 month to see her progress.  Depending on her progress, she may need gallbladder surgery vs umbilical hernia surgery, a combination of both, vs only medical management. --Again, the patient wants to see her endocrinologist first before any surgical referrals for her large goiter.  Face-to-face time spent with the patient and care providers was 30 minutes, with more than 50% of the time spent counseling, educating, and coordinating care of the patient.     Melvyn Neth, [facility]

## 2020-05-27 NOTE — Patient Instructions (Addendum)
Please continue taking your Protonix as prescribed. If you have any concerns or questions, please feel free to call our office. See follow up appointment below.     Cholelithiasis  Cholelithiasis happens when gallstones form in the gallbladder. The gallbladder stores bile. Bile is a fluid that helps digest fats. Bile can harden and form into gallstones. If they cause a blockage, they can cause pain (gallbladder attack). What are the causes? This condition may be caused by:  Some blood diseases, such as sickle cell anemia.  Too much of a fat-like substance (cholesterol) in your bile.  Not enough bile salts in your bile. These salts help the body absorb and digest fats.  The gallbladder not emptying fully or often enough. This is common in pregnant women. What increases the risk? The following factors may make you more likely to develop this condition:  Being female.  Being pregnant many times.  Eating a lot of fried foods, fat, and refined carbs (refined carbohydrates).  Being very overweight (obese).  Being older than age 26.  Using medicines with female hormones in them for a long time.  Losing weight fast.  Having gallstones in your family.  Having some health problems, such as diabetes, Crohn's disease, or liver disease. What are the signs or symptoms? Often, there may be gallstones but no symptoms. These gallstones are called silent gallstones. If a gallstone causes a blockage, you may get sudden pain. The pain:  Can be in the upper right part of your belly (abdomen).  Normally comes at night or after you eat.  Can last an hour or more.  Can spread to your right shoulder, back, or chest.  Can feel like discomfort, burning, or fullness in the upper part of your belly (indigestion). If the blockage lasts more than a few hours, you can get an infection or swelling. You may:  Feel like you may vomit.  Vomit.  Feel bloated.  Have belly pain for 5 hours or  more.  Feel tender in your belly, often in the upper right part and under your ribs.  Have fever or chills.  Have skin or the white parts of your eyes turn yellow (jaundice).  Have dark pee (urine) or pale poop (stool). How is this treated? Treatment for this condition depends on how bad you feel. If you have symptoms, you may need:  Home care, if symptoms are not very bad. ? Do not eat for 12-24 hours. Drink only water and clear liquids. ? Start to eat simple or clear foods after 1 or 2 days. Try broths and crackers. ? You may need medicines for pain or stomach upset or both. ? If you have an infection, you will need antibiotics.  A hospital stay, if you have very bad pain or a very bad infection.  Surgery to remove your gallbladder. You may need this if: ? Gallstones keep coming back. ? You have very bad symptoms.  Medicines to break up gallstones. Medicines: ? Are best for small gallstones. ? May be used for up to 6-12 months.  A procedure to find and take out gallstones or to break up gallstones. Follow these instructions at home: Medicines  Take over-the-counter and prescription medicines only as told by your doctor.  If you were prescribed an antibiotic medicine, take it as told by your doctor. Do not stop taking the antibiotic even if you start to feel better.  Ask your doctor if the medicine prescribed to you requires you to avoid driving  or using machinery. Eating and drinking  Drink enough fluid to keep your urine pale yellow. Drink water or clear fluids. This is important when you have pain.  Eat healthy foods. Choose: ? Fewer fatty foods, such as fried foods. ? Fewer refined carbs. Avoid breads and grains that are highly processed, such as white bread and white rice. Choose whole grains, such as whole-wheat bread and brown rice. ? More fiber. Almonds, fresh fruit, and beans are healthy sources. General instructions  Keep a healthy weight.  Keep all  follow-up visits as told by your doctor. This is important. Where to find more information  [employer] of Diabetes and Digestive and Kidney Diseases: [email or web address] Contact a doctor if:  You have sudden pain in the upper right part of your belly. Pain might spread to your right shoulder, back, or chest.  You have been diagnosed with gallstones that have no symptoms and you get: ? Belly pain. ? Discomfort, burning, or fullness in the upper part of your abdomen.  You have dark urine or pale stools. Get help right away if:  You have sudden pain in the upper right part of your abdomen, and the pain lasts more than 2 hours.  You have pain in your abdomen, and: ? It lasts more than 5 hours. ? It keeps getting worse.  You have a fever or chills.  You keep feeling like you may vomit.  You keep vomiting.  Your skin or the white parts of your eyes turn yellow. Summary  Cholelithiasis happens when gallstones form in the gallbladder.  This condition may be caused by a blood disease, too much of a fat-like substance in the bile, or not enough bile salts in bile.  Treatment for this condition depends on how bad you feel.  If you have symptoms, do not eat or drink. You may need medicines. You may need a hospital stay for very bad pain or a very bad infection.  You may need surgery if gallstones keep coming back or if you have very bad symptoms. This information is not intended to replace advice given to you by your health care provider. Make sure you discuss any questions you have with your health care provider. Document Revised: 05/01/2019 Document Reviewed: 02/02/2019 Elsevier Patient Education  2021 [place].    Umbilical Hernia, Adult  A hernia is a bulge of tissue that pushes through an opening between muscles. An umbilical hernia happens in the abdomen, near the belly button (umbilicus). The hernia may contain tissues from the small intestine, large intestine, or  fatty tissue covering the intestines (omentum). Umbilical hernias in adults tend to get worse over time, and they require surgical treatment. There are several types of umbilical hernias. You may have:  A hernia located just above or below the umbilicus (indirect hernia). This is the most common type of umbilical hernia in adults.  A hernia that forms through an opening formed by the umbilicus (direct hernia).  A hernia that comes and goes (reducible hernia). A reducible hernia may be visible only when you strain, lift something heavy, or cough. This type of hernia can be pushed back into the abdomen (reduced).  A hernia that traps abdominal tissue inside the hernia (incarcerated hernia). This type of hernia cannot be reduced.  A hernia that cuts off blood flow to the tissues inside the hernia (strangulated hernia). The tissues can start to die if this happens. This type of hernia requires emergency treatment. What are the causes? An  umbilical hernia happens when tissue inside the abdomen presses on a weak area of the abdominal muscles. What increases the risk? You may have a greater risk of this condition if you:  Are obese.  Have had several pregnancies.  Have a buildup of fluid inside your abdomen (ascites).  Have had surgery that weakens the abdominal muscles. What are the signs or symptoms? The main symptom of this condition is a painless bulge at or near the belly button. A reducible hernia may be visible only when you strain, lift something heavy, or cough. Other symptoms may include:  Dull pain.  A feeling of pressure. Symptoms of a strangulated hernia may include:  Pain that gets increasingly worse.  Nausea and vomiting.  Pain when pressing on the hernia.  Skin over the hernia becoming red or purple.  Constipation.  Blood in the stool. How is this diagnosed? This condition may be diagnosed based on:  A physical exam. You may be asked to cough or strain while  standing. These actions increase the pressure inside your abdomen and force the hernia through the opening in your muscles. Your health care provider may try to reduce the hernia by pressing on it.  Your symptoms and medical history. How is this treated? Surgery is the only treatment for an umbilical hernia. Surgery for a strangulated hernia is done as soon as possible. If you have a small hernia that is not incarcerated, you may need to lose weight before having surgery. Follow these instructions at home:  Lose weight, if told by your health care provider.  Do not try to push the hernia back in.  Watch your hernia for any changes in color or size. Tell your health care provider if any changes occur.  You may need to avoid activities that increase pressure on your hernia.  Do not lift anything that is heavier than 10 lb (4.5 kg) until your health care provider says that this is safe.  Take over-the-counter and prescription medicines only as told by your health care provider.  Keep all follow-up visits as told by your health care provider. This is important. Contact a health care provider if:  Your hernia gets larger.  Your hernia becomes painful. Get help right away if:  You develop sudden, severe pain near the area of your hernia.  You have pain as well as nausea or vomiting.  You have pain and the skin over your hernia changes color.  You develop a fever. This information is not intended to replace advice given to you by your health care provider. Make sure you discuss any questions you have with your health care provider. Document Revised: 04/24/2017 Document Reviewed: 09/10/2016 Elsevier Patient Education  [place].

## 2020-06-14 ENCOUNTER — Other Ambulatory Visit: Payer: Self-pay

## 2020-06-14 ENCOUNTER — Ambulatory Visit: Payer: Medicare PPO | Admitting: Physician Assistant

## 2020-06-17 ENCOUNTER — Ambulatory Visit (INDEPENDENT_AMBULATORY_CARE_PROVIDER_SITE_OTHER): Payer: Medicare PPO | Admitting: Surgery

## 2020-06-17 ENCOUNTER — Encounter: Payer: Self-pay | Admitting: Surgery

## 2020-06-17 ENCOUNTER — Other Ambulatory Visit: Payer: Self-pay

## 2020-06-17 VITALS — BP 102/65 | HR 79 | Temp 98.0°F | Ht 63.0 in | Wt 205.0 lb

## 2020-06-17 DIAGNOSIS — K8689 Other specified diseases of pancreas: Secondary | ICD-10-CM

## 2020-06-17 DIAGNOSIS — R1033 Periumbilical pain: Secondary | ICD-10-CM | POA: Diagnosis not present

## 2020-06-17 DIAGNOSIS — K8020 Calculus of gallbladder without cholecystitis without obstruction: Secondary | ICD-10-CM | POA: Diagnosis not present

## 2020-06-17 DIAGNOSIS — K2980 Duodenitis without bleeding: Secondary | ICD-10-CM | POA: Diagnosis not present

## 2020-06-17 NOTE — Patient Instructions (Addendum)
We will refer you to Oncology to better assess what may be going on with your pancreas.  They will call you to schedule this appointment.   Continue taking the Pantoprazole(Protonix).   We will have you follow up with [place] about your hernia in a couple of weeks after you see Oncology. Call us to schedule this appointment.

## 2020-06-19 ENCOUNTER — Encounter: Payer: Self-pay | Admitting: Surgery

## 2020-06-19 NOTE — Progress Notes (Signed)
06/17/2020  History of Present Illness: Tracy Bolton is a 80 y.o. female presenting for follow up of cholelithiasis and periumbilical abdominal pain.  She was last seen on 05/27/20 for this.  On exam, her pain seemed to be mostly at the periumbilical level, where she has a small umbilical hernia.  She reported having pain in different locations, mostly in the mid abdomen, but sometimes lower abdomen, epigastric, and RUQ areas.  In the ED, she got much better after taking Protonix.  Thus, I recommended that she continue the Protonix that was prescribed in the ED. Today, she reports that the majority of the pain is much improved.  She's taking Protonix daily.  The only remaining discomfort is at the umbilical level.    Of note, in the ED workup, her CT scan also showed a potential pancreatic head mass.  She was supposed to see her PCP's office to discuss the findings and send a referral to Oncology, but the patient canceled the appointment.  [name] contacted me about this and asked if I could discuss with the patient and send the referral for Oncology.  Past Medical History: Past Medical History:  Diagnosis Date  . Arthritis   . Gout, chronic   . Hyperlipidemia   . Hypertension   . Osteoporosis   . Thyroid disease      Past Surgical History: Past Surgical History:  Procedure Laterality Date  . CATARACT EXTRACTION, BILATERAL  02/2018  . COLONOSCOPY  2016  . LUNG SURGERY Right 2004   biopsy normal    Home Medications: Prior to Admission medications   Medication Sig Start Date End Date Taking? Authorizing Provider  allopurinol (ZYLOPRIM) 100 MG tablet TAKE 1 TABLET BY MOUTH EVERY DAY 09/29/19  Yes Delsa Grana, PA-C  amLODipine (NORVASC) 10 MG tablet TAKE 1 TABLET BY MOUTH EVERY DAY 09/29/19  Yes Delsa Grana, PA-C  aspirin 81 MG tablet Take 81 mg by mouth. 03/13/06  Yes [provider]  atenolol (TENORMIN) 50 MG tablet TAKE 1 TABLET BY MOUTH EVERY DAY 09/29/19  Yes Delsa Grana,  PA-C  atorvastatin (LIPITOR) 10 MG tablet TAKE 1 TABLET BY MOUTH EVERYDAY AT BEDTIME 08/24/19  Yes Tapia, Leisa, PA-C  [facility] SENNA 8.6 MG tablet TAKE 1 TABLET (8.6 MG TOTAL) BY MOUTH 2 (TWO) TIMES DAILY AS NEEDED FOR CONSTIPATION. 04/29/19  Yes Delsa Grana, PA-C  metFORMIN (GLUCOPHAGE-XR) 500 MG 24 hr tablet TAKE 2 TABLETS BY MOUTH EVERY DAY 09/29/19  Yes Delsa Grana, PA-C  Multiple Vitamin (MULTI-VITAMINS) TABS Take by mouth. 11/14/07  Yes [provider]  nystatin (MYCOSTATIN/NYSTOP) powder APPLY TO AFFECTED AREA OF SKIN 3X A DAY AS NEEDED IN A SUFFICIENT AMOUNT 01/27/20  Yes Delsa Grana, PA-C  pantoprazole (PROTONIX) 40 MG tablet Take 1 tablet (40 mg total) by mouth daily. 05/23/20 05/23/21 Yes Ward, Delice Bison, DO  polyethylene glycol powder (GLYCOLAX/MIRALAX) 17 GM/SCOOP powder Take 8.5-17 g by mouth daily as needed for moderate constipation. (take half a cap to one full cap full dissolved in clear liquid once daily prn for constipation) 04/12/20  Yes Delsa Grana, PA-C    Allergies: No Known Allergies  Review of Systems: Review of Systems  Constitutional: Negative for chills and fever.  Respiratory: Negative for shortness of breath.   Cardiovascular: Negative for chest pain.  Gastrointestinal: Positive for abdominal pain (periumbilical). Negative for constipation, diarrhea, nausea and vomiting.    Physical Exam BP 102/65   Pulse 79   Temp 98 F (36.7 C)  Ht 5\' 3"  (1.6 m)   Wt 205 lb (93 kg)   SpO2 96%   BMI 36.31 kg/m  CONSTITUTIONAL: No acute distress HEENT:  Normocephalic, atraumatic, extraocular motion intact. RESPIRATORY:  Normal respiratory effort without pathologic use of accessory muscles. CARDIOVASCULAR: Regular rhythm and rate. GI: The abdomen is soft, non-distended, with some discomfort to palpation at the umbilicus, where patient has a reducible umbilical hernia.  No tenderness in epigastric or RUQ areas. NEUROLOGIC:  Motor and sensation is grossly normal.   Cranial nerves are grossly intact. PSYCH:  Alert and oriented to person, place and time. Affect is normal.   Assessment and Plan: This is a 80 y.o. female with periumbilical pain.  --Discussed with the patient that if her pain other than the umbilical area is mostly resolved, then her original pain may indeed have been related to duodenitis and now improving with the use of Protonix.  Although she has cholelithiasis, this may actually be asymptomatic.  At this point, no true indication for cholecystectomy. --The patient's CT scan does show an area of hypodensity at the head of the pancreas.  Her PCP was going to refer her to Oncology but the patient canceled the appointment.  I have discussed with her the findings on her CT scan and will send the referral to Oncology at her PCP's request.  This will likely need workup with MRI. --With regards to her umbilical discomfort, I think this would be appropriate for umbilical hernia repair.  However, in light of the CT scan findings with her pancreas, would rather make sure that part of the work up is completed before scheduling elective surgery.  The patient will contact us when she schedules her appointment with Oncology so that we can schedule follow up with her and hopefully discuss umbilical hernia repair at that point.  Face-to-face time spent with the patient and care providers was 25 minutes, with more than 50% of the time spent counseling, educating, and coordinating care of the patient.     Melvyn Neth, [facility]

## 2020-06-21 ENCOUNTER — Ambulatory Visit (INDEPENDENT_AMBULATORY_CARE_PROVIDER_SITE_OTHER): Payer: Medicare PPO

## 2020-06-21 DIAGNOSIS — Z0000 Encounter for general adult medical examination without abnormal findings: Secondary | ICD-10-CM

## 2020-06-21 NOTE — Patient Instructions (Signed)
Tracy Bolton , Thank you for taking time to come for your Medicare Wellness Visit. I appreciate your ongoing commitment to your health goals. Please review the following plan we discussed and let me know if I can assist you in the future.   Screening recommendations/referrals: Colonoscopy: Done 07/13/2014 - no longer required due to age Mammogram: Done 01/20/2020 Bone Density: Done 01/20/2020 - Repeat 2 years Recommended yearly ophthalmology/optometry visit for glaucoma screening and checkup Recommended yearly dental visit for hygiene and checkup  Vaccinations: Influenza vaccine: Declined Pneumococcal vaccine: Done 05/21/2017  Tdap vaccine: Done  06/24/2013 Shingles vaccine: Done 06/07/19 and 09/08/19   Covid-19: Done 11/18/19, 12/17/19, 06/18/20  Advanced directives: Please bring a copy of your health care power of attorney and living will to the office to be added to your chart at your convenience.  Conditions/risks identified: Try to get 30 minutes of physical activity every day and drink plenty of water.  Next appointment: Follow up in one year for your annual wellness visit    Preventive Care 65 Years and Older, Female Preventive care refers to lifestyle choices and visits with your health care provider that can promote health and wellness. What does preventive care include?  A yearly physical exam. This is also called an annual well check.  Dental exams once or twice a year.  Routine eye exams. Ask your health care provider how often you should have your eyes checked.  Personal lifestyle choices, including:  Daily care of your teeth and gums.  Regular physical activity.  Eating a healthy diet.  Avoiding tobacco and drug use.  Limiting alcohol use.  Practicing safe sex.  Taking low-dose aspirin every day.  Taking vitamin and mineral supplements as recommended by your health care provider. What happens during an annual well check? The services and screenings done by your  health care provider during your annual well check will depend on your age, overall health, lifestyle risk factors, and family history of disease. Counseling  Your health care provider may ask you questions about your:  Alcohol use.  Tobacco use.  Drug use.  Emotional well-being.  Home and relationship well-being.  Sexual activity.  Eating habits.  History of falls.  Memory and ability to understand (cognition).  Work and work Statistician.  Reproductive health. Screening  You may have the following tests or measurements:  Height, weight, and BMI.  Blood pressure.  Lipid and cholesterol levels. These may be checked every 5 years, or more frequently if you are over 25 years old.  Skin check.  Lung cancer screening. You may have this screening every year starting at age 92 if you have a 30-pack-year history of smoking and currently smoke or have quit within the past 15 years.  Fecal occult blood test (FOBT) of the stool. You may have this test every year starting at age 56.  Flexible sigmoidoscopy or colonoscopy. You may have a sigmoidoscopy every 5 years or a colonoscopy every 10 years starting at age 62.  Hepatitis C blood test.  Hepatitis B blood test.  Sexually transmitted disease (STD) testing.  Diabetes screening. This is done by checking your blood sugar (glucose) after you have not eaten for a while (fasting). You may have this done every 1-3 years.  Bone density scan. This is done to screen for osteoporosis. You may have this done starting at age 74.  Mammogram. This may be done every 1-2 years. Talk to your health care provider about how often you should have regular mammograms.  Talk with your health care provider about your test results, treatment options, and if necessary, the need for more tests. Vaccines  Your health care provider may recommend certain vaccines, such as:  Influenza vaccine. This is recommended every year.  Tetanus, diphtheria, and  acellular pertussis (Tdap, Td) vaccine. You may need a Td booster every 10 years.  Zoster vaccine. You may need this after age 73.  Pneumococcal 13-valent conjugate (PCV13) vaccine. One dose is recommended after age 100.  Pneumococcal polysaccharide (PPSV23) vaccine. One dose is recommended after age 66. Talk to your health care provider about which screenings and vaccines you need and how often you need them. This information is not intended to replace advice given to you by your health care provider. Make sure you discuss any questions you have with your health care provider. Document Released: 04/08/2015 Document Revised: 11/30/2015 Document Reviewed: 01/11/2015 Elsevier Interactive Patient Education  2017 [place] Prevention in the Home Falls can cause injuries. They can happen to people of all ages. There are many things you can do to make your home safe and to help prevent falls. What can I do on the outside of my home?  Regularly fix the edges of walkways and driveways and fix any cracks.  Remove anything that might make you trip as you walk through a door, such as a raised step or threshold.  Trim any bushes or trees on the path to your home.  Use bright outdoor lighting.  Clear any walking paths of anything that might make someone trip, such as rocks or tools.  Regularly check to see if handrails are loose or broken. Make sure that both sides of any steps have handrails.  Any raised decks and porches should have guardrails on the edges.  Have any leaves, snow, or ice cleared regularly.  Use sand or salt on walking paths during winter.  Clean up any spills in your garage right away. This includes oil or grease spills. What can I do in the bathroom?  Use night lights.  Install grab bars by the toilet and in the tub and shower. Do not use towel bars as grab bars.  Use non-skid mats or decals in the tub or shower.  If you need to sit down in the shower, use  a plastic, non-slip stool.  Keep the floor dry. Clean up any water that spills on the floor as soon as it happens.  Remove soap buildup in the tub or shower regularly.  Attach bath mats securely with double-sided non-slip rug tape.  Do not have throw rugs and other things on the floor that can make you trip. What can I do in the bedroom?  Use night lights.  Make sure that you have a light by your bed that is easy to reach.  Do not use any sheets or blankets that are too big for your bed. They should not hang down onto the floor.  Have a firm chair that has side arms. You can use this for support while you get dressed.  Do not have throw rugs and other things on the floor that can make you trip. What can I do in the kitchen?  Clean up any spills right away.  Avoid walking on wet floors.  Keep items that you use a lot in easy-to-reach places.  If you need to reach something above you, use a strong step stool that has a grab bar.  Keep electrical cords out of the way.  Do not use floor polish or wax that makes floors slippery. If you must use wax, use non-skid floor wax.  Do not have throw rugs and other things on the floor that can make you trip. What can I do with my stairs?  Do not leave any items on the stairs.  Make sure that there are handrails on both sides of the stairs and use them. Fix handrails that are broken or loose. Make sure that handrails are as long as the stairways.  Check any carpeting to make sure that it is firmly attached to the stairs. Fix any carpet that is loose or worn.  Avoid having throw rugs at the top or bottom of the stairs. If you do have throw rugs, attach them to the floor with carpet tape.  Make sure that you have a light switch at the top of the stairs and the bottom of the stairs. If you do not have them, ask someone to add them for you. What else can I do to help prevent falls?  Wear shoes that:  Do not have high heels.  Have  rubber bottoms.  Are comfortable and fit you well.  Are closed at the toe. Do not wear sandals.  If you use a stepladder:  Make sure that it is fully opened. Do not climb a closed stepladder.  Make sure that both sides of the stepladder are locked into place.  Ask someone to hold it for you, if possible.  Clearly mark and make sure that you can see:  Any grab bars or handrails.  First and last steps.  Where the edge of each step is.  Use tools that help you move around (mobility aids) if they are needed. These include:  Canes.  Walkers.  Scooters.  Crutches.  Turn on the lights when you go into a dark area. Replace any light bulbs as soon as they burn out.  Set up your furniture so you have a clear path. Avoid moving your furniture around.  If any of your floors are uneven, fix them.  If there are any pets around you, be aware of where they are.  Review your medicines with your doctor. Some medicines can make you feel dizzy. This can increase your chance of falling. Ask your doctor what other things that you can do to help prevent falls. This information is not intended to replace advice given to you by your health care provider. Make sure you discuss any questions you have with your health care provider. Document Released: 01/06/2009 Document Revised: 08/18/2015 Document Reviewed: 04/16/2014 Elsevier Interactive Patient Education  2017 [employer].

## 2020-06-21 NOTE — Progress Notes (Signed)
Subjective:   Tracy Bolton is a 80 y.o. female who presents for Medicare Annual (Subsequent) preventive examination.  Virtual Visit via Telephone Note  I connected with  Tracy Bolton on 06/21/20 at 10:40 AM EDT by telephone and verified that I am speaking with the correct person using two identifiers.  Location: Patient: Home  Provider: [place] Persons participating in the virtual visit: patient/Nurse Health Advisor   I discussed the limitations, risks, security and privacy concerns of performing an evaluation and management service by telephone and the availability of in person appointments. The patient expressed understanding and agreed to proceed.  Interactive audio and video telecommunications were attempted between this nurse and patient, however failed, due to patient having technical difficulties OR patient did not have access to video capability.  We continued and completed visit with audio only.  Some vital signs may be absent or patient reported.   Oletha Tolson E Leean Amezcua, LPN   Review of Systems     Cardiac Risk Factors include: advanced age (>71men, >71 women);dyslipidemia;hypertension;obesity (BMI >30kg/m2)     Objective:    There were no vitals filed for this visit. There is no height or weight on file to calculate BMI.  Advanced Directives 06/21/2020 05/22/2020 06/18/2019 06/10/2018 11/19/2016 05/18/2016 05/18/2016  Does Patient Have a Medical Advance Directive? Yes No No Yes Yes Yes No  Type of Paramedic of [place];Living will - - [place];Living will - [place];Living will -  Does patient want to make changes to medical advance directive? - - No - Patient declined - - - -  Copy of [place] in Chart? No - copy requested - - No - copy requested - - -  Would patient like information on creating a medical advance directive? - - - - - - -    Current Medications (verified) Outpatient Encounter  Medications as of 06/21/2020  Medication Sig  . allopurinol (ZYLOPRIM) 100 MG tablet TAKE 1 TABLET BY MOUTH EVERY DAY  . amLODipine (NORVASC) 10 MG tablet TAKE 1 TABLET BY MOUTH EVERY DAY  . aspirin 81 MG tablet Take 81 mg by mouth.  Marland Kitchen atenolol (TENORMIN) 50 MG tablet TAKE 1 TABLET BY MOUTH EVERY DAY  . atorvastatin (LIPITOR) 10 MG tablet TAKE 1 TABLET BY MOUTH EVERYDAY AT BEDTIME  . metFORMIN (GLUCOPHAGE-XR) 500 MG 24 hr tablet TAKE 2 TABLETS BY MOUTH EVERY DAY  . Multiple Vitamin (MULTI-VITAMINS) TABS Take by mouth.  . nystatin (MYCOSTATIN/NYSTOP) powder APPLY TO AFFECTED AREA OF SKIN 3X A DAY AS NEEDED IN A SUFFICIENT AMOUNT  . pantoprazole (PROTONIX) 40 MG tablet Take 1 tablet (40 mg total) by mouth daily.  . [employer] SENNA 8.6 MG tablet TAKE 1 TABLET (8.6 MG TOTAL) BY MOUTH 2 (TWO) TIMES DAILY AS NEEDED FOR CONSTIPATION. (Patient not taking: Reported on 06/21/2020)  . polyethylene glycol powder (GLYCOLAX/MIRALAX) 17 GM/SCOOP powder Take 8.5-17 g by mouth daily as needed for moderate constipation. (take half a cap to one full cap full dissolved in clear liquid once daily prn for constipation) (Patient not taking: Reported on 06/21/2020)   No facility-administered encounter medications on file as of 06/21/2020.    Allergies (verified) Patient has no known allergies.   History: Past Medical History:  Diagnosis Date  . Arthritis   . Gout, chronic   . Hyperlipidemia   . Hypertension   . Osteoporosis   . Thyroid disease    Past Surgical History:  Procedure Laterality  Date  . CATARACT EXTRACTION, BILATERAL  02/2018  . COLONOSCOPY  2016  . LUNG SURGERY Right 2004   biopsy normal   Family History  Problem Relation Age of Onset  . Bone cancer Father 23  . Thyroid disease Sister   . Cancer Mother 38       brain tumor  . Breast cancer Neg Hx    Social History   Socioeconomic History  . Marital status: Single    Spouse name: Not on file  . Number of children: 2  . Years of  education: Not on file  . Highest education level: Some college, no degree  Occupational History  . Occupation: retired  Tobacco Use  . Smoking status: Never Smoker  . Smokeless tobacco: Never Used  Vaping Use  . Vaping Use: Never used  Substance and Sexual Activity  . Alcohol use: No    Alcohol/week: 0.0 standard drinks  . Drug use: No  . Sexual activity: Not Currently  Other Topics Concern  . Not on file  Social History Narrative   Pt lives alone.    Social Determinants of Health   Financial Resource Strain: Low Risk   . Difficulty of Paying Living Expenses: Not hard at all  Food Insecurity: No Food Insecurity  . Worried About Charity fundraiser in the Last Year: Never true  . Ran Out of Food in the Last Year: Never true  Transportation Needs: No Transportation Needs  . Lack of Transportation (Medical): No  . Lack of Transportation (Non-Medical): No  Physical Activity: Insufficiently Active  . Days of Exercise per Week: 7 days  . Minutes of Exercise per Session: 20 min  Stress: No Stress Concern Present  . Feeling of Stress : Not at all  Social Connections: Moderately Integrated  . Frequency of Communication with Friends and Family: More than three times a week  . Frequency of Social Gatherings with Friends and Family: More than three times a week  . Attends Religious Services: More than 4 times per year  . Active Member of Clubs or Organizations: Yes  . Attends Archivist Meetings: More than 4 times per year  . Marital Status: Never married    Tobacco Counseling Counseling given: Not Answered   Clinical Intake:  Pre-visit preparation completed: Yes  Pain : No/denies pain     BMI - recorded: 36.31 Nutritional Status: BMI > 30  Obese Nutritional Risks: None Diabetes: No  How often do you need to have someone help you when you read instructions, pamphlets, or other written materials from your doctor or pharmacy?: 1 - Never  Diabetic?  No  Interpreter Needed?: No  Information entered by :: Tracy Hanback, LPN   Activities of Daily Living In your present state of health, do you have any difficulty performing the following activities: 06/21/2020 04/08/2020  Hearing? N N  Vision? N N  Difficulty concentrating or making decisions? N N  Walking or climbing stairs? N N  Comment No trouble if not too many or too steep -  Dressing or bathing? N N  Doing errands, shopping? N N  Preparing Food and eating ? N -  Using the Toilet? N -  In the past six months, have you accidently leaked urine? N -  Do you have problems with loss of bowel control? N -  Managing your Medications? N -  Managing your Finances? N -  Housekeeping or managing your Housekeeping? N -  Some recent data might be  hidden    Patient Care Team: Delsa Grana, PA-C as PCP - General (Family Medicine) Loney Loh, Michaell Cowing, MD as Referring Physician (Endocrinology) Olean Ree, MD as Consulting Physician (General Surgery)  Indicate any recent Medical Services you may have received from other than [facility] providers in the past year (date may be approximate).     Assessment:   This is a routine wellness examination for Tracy Bolton.  Hearing/Vision screen  Hearing Screening   125Hz  250Hz  500Hz  1000Hz  2000Hz  3000Hz  4000Hz  6000Hz  8000Hz   Right ear:           Left ear:           Comments: Denies hearing difficulty  Vision Screening Comments: Annual vision screenings [facility] by [facility]  Dietary issues and exercise activities discussed: Current Exercise Habits: Home exercise routine, Type of exercise: calisthenics, Time (Minutes): 15, Frequency (Times/Week): 7, Weekly Exercise (Minutes/Week): 105, Intensity: Mild, Exercise limited by: None identified  Goals    .  Weight (lb) < 230 lb (104.3 kg) (pt-stated)      Depression Screen PHQ 2/9 Scores 06/21/2020 04/08/2020 10/06/2019 06/18/2019 06/03/2019 03/03/2019 01/23/2019  PHQ - 2 Score 0 0 0 0 0 0 0  PHQ- 9  Score - - 0 - 0 0 0    Fall Risk Fall Risk  06/21/2020 04/08/2020 10/06/2019 06/18/2019 06/03/2019  Falls in the past year? 0 0 0 0 0  Comment - - - - -  Number falls in past yr: 0 0 0 0 0  Comment - - - - -  Injury with Fall? 0 0 0 0 0  Risk for fall due to : - - - Impaired mobility -  Follow up - Falls evaluation completed Falls evaluation completed Falls prevention discussed -    FALL RISK PREVENTION PERTAINING TO THE HOME:  Any stairs in or around the home? No  If so, are there any without handrails? No  Home free of loose throw rugs in walkways, pet beds, electrical cords, etc? Yes  Adequate lighting in your home to reduce risk of falls? Yes   ASSISTIVE DEVICES UTILIZED TO PREVENT FALLS:  Life alert? Yes  Use of a cane, walker or w/c? Yes  Grab bars in the bathroom? Yes  Shower chair or bench in shower? No  Elevated toilet seat or a handicapped toilet? Yes   TIMED UP AND GO:  Was the test performed? No . telephonic visit.   Cognitive Function: Normal cognitive status assessed by direct observation by this Nurse Health Advisor. No abnormalities found.       6CIT Screen 06/18/2019 06/10/2018 05/21/2017  What Year? 0 points 0 points 0 points  What month? 0 points 0 points 0 points  What time? 0 points 0 points 0 points  Count back from 20 0 points 0 points 0 points  Months in reverse 0 points 0 points 0 points  Repeat phrase 0 points 0 points 0 points  Total Score 0 0 0    Immunizations Immunization History  Administered Date(s) Administered  . Moderna Sars-Covid-2 Vaccination 11/18/2019, 12/17/2019, 06/18/2020  . Pneumococcal Conjugate-13 05/21/2017  . Pneumococcal Polysaccharide-23 12/31/2013, 05/18/2015  . Tdap 06/24/2013  . Zoster Recombinat (Shingrix) 06/08/2019, 09/07/2019    TDAP status: Up to date  Flu Vaccine status: Declined, Education has been provided regarding the importance of this vaccine but patient still declined. Advised may receive this vaccine  at local pharmacy or Health Dept. Aware to provide a copy of the vaccination record if  obtained from local pharmacy or Health Dept. Verbalized acceptance and understanding.  Pneumococcal vaccine status: Up to date  Covid-19 vaccine status: Completed vaccines  Qualifies for Shingles Vaccine? Yes   Zostavax completed Yes   Shingrix Completed?: Yes  Screening Tests Health Maintenance  Topic Date Due  . INFLUENZA VACCINE  06/23/2020 (Originally 10/25/2019)  . Hepatitis C Screening  10/05/2020 (Originally 1940/04/26)  . MAMMOGRAM  01/19/2021  . DEXA SCAN  01/19/2022  . TETANUS/TDAP  06/25/2023  . COVID-19 Vaccine  Completed  . PNA vac Low Risk Adult  Addressed  . HPV VACCINES  Aged Out    Health Maintenance  There are no preventive care reminders to display for this patient.  Colorectal cancer screening: No longer required.   Mammogram status: Completed 01/20/2020. Repeat every year  Bone Density status: Completed 01/20/2020. Results reflect: Bone density results: OSTEOPENIA. Repeat every 2 years.  Lung Cancer Screening: (Low Dose CT Chest recommended if Age 42-80 years, 30 pack-year currently smoking OR have quit w/in 15years.) does not qualify.   Additional Screening:  Hepatitis C Screening: does not qualify  Vision Screening: Recommended annual ophthalmology exams for early detection of glaucoma and other disorders of the eye. Is the patient up to date with their annual eye exam?  Yes  Who is the provider or what is the name of the office in which the patient attends annual eye exams? [facility] If pt is not established with a provider, would they like to be referred to a provider to establish care? No .   Dental Screening: Recommended annual dental exams for proper oral hygiene  Community Resource Referral / Chronic Care Management: CRR required this visit?  No   CCM required this visit?  No      Plan:     I have personally reviewed and noted the following  in the patient's chart:   . Medical and social history . Use of alcohol, tobacco or illicit drugs  . Current medications and supplements . Functional ability and status . Nutritional status . Physical activity . Advanced directives . List of other physicians . Hospitalizations, surgeries, and ER visits in previous 12 months . Vitals . Screenings to include cognitive, depression, and falls . Referrals and appointments  In addition, I have reviewed and discussed with patient certain preventive protocols, quality metrics, and best practice recommendations. A written personalized care plan for preventive services as well as general preventive health recommendations were provided to patient.     Sandrea Hammond, LPN   [id number]   Nurse Notes: None

## 2020-06-24 ENCOUNTER — Encounter: Payer: Self-pay | Admitting: Internal Medicine

## 2020-06-24 ENCOUNTER — Inpatient Hospital Stay: Payer: Medicare PPO | Attending: Internal Medicine | Admitting: Internal Medicine

## 2020-06-24 ENCOUNTER — Other Ambulatory Visit: Payer: Self-pay

## 2020-06-24 ENCOUNTER — Inpatient Hospital Stay: Payer: Medicare PPO

## 2020-06-24 DIAGNOSIS — C250 Malignant neoplasm of head of pancreas: Secondary | ICD-10-CM | POA: Diagnosis not present

## 2020-06-24 DIAGNOSIS — R7989 Other specified abnormal findings of blood chemistry: Secondary | ICD-10-CM | POA: Insufficient documentation

## 2020-06-24 DIAGNOSIS — C784 Secondary malignant neoplasm of small intestine: Secondary | ICD-10-CM | POA: Insufficient documentation

## 2020-06-24 DIAGNOSIS — I959 Hypotension, unspecified: Secondary | ICD-10-CM | POA: Diagnosis not present

## 2020-06-24 DIAGNOSIS — Z808 Family history of malignant neoplasm of other organs or systems: Secondary | ICD-10-CM | POA: Diagnosis not present

## 2020-06-24 DIAGNOSIS — N179 Acute kidney failure, unspecified: Secondary | ICD-10-CM | POA: Diagnosis not present

## 2020-06-24 DIAGNOSIS — R918 Other nonspecific abnormal finding of lung field: Secondary | ICD-10-CM | POA: Insufficient documentation

## 2020-06-24 DIAGNOSIS — K862 Cyst of pancreas: Secondary | ICD-10-CM

## 2020-06-24 DIAGNOSIS — K8689 Other specified diseases of pancreas: Secondary | ICD-10-CM | POA: Insufficient documentation

## 2020-06-24 LAB — COMPREHENSIVE METABOLIC PANEL
ALT: 121 U/L — ABNORMAL HIGH (ref 0–44)
AST: 191 U/L — ABNORMAL HIGH (ref 15–41)
Albumin: 3.9 g/dL (ref 3.5–5.0)
Alkaline Phosphatase: 157 U/L — ABNORMAL HIGH (ref 38–126)
Anion gap: 12 (ref 5–15)
BUN: 13 mg/dL (ref 8–23)
CO2: 22 mmol/L (ref 22–32)
Calcium: 9.5 mg/dL (ref 8.9–10.3)
Chloride: 107 mmol/L (ref 98–111)
Creatinine, Ser: 1.08 mg/dL — ABNORMAL HIGH (ref 0.44–1.00)
GFR, Estimated: 53 mL/min — ABNORMAL LOW (ref >60)
Glucose, Bld: 166 mg/dL — ABNORMAL HIGH (ref 70–99)
Potassium: 3.6 mmol/L (ref 3.5–5.1)
Sodium: 141 mmol/L (ref 135–145)
Total Bilirubin: 3.6 mg/dL — ABNORMAL HIGH (ref 0.3–1.2)
Total Protein: 8 g/dL (ref 6.5–8.1)

## 2020-06-24 LAB — CBC WITH DIFFERENTIAL/PLATELET
Abs Immature Granulocytes: 0.02 10*3/uL (ref 0.00–0.07)
Basophils Absolute: 0.1 10*3/uL (ref 0.0–0.1)
Basophils Relative: 1 %
Eosinophils Absolute: 0.2 10*3/uL (ref 0.0–0.5)
Eosinophils Relative: 2 %
HCT: 40 % (ref 36.0–46.0)
Hemoglobin: 12.6 g/dL (ref 12.0–15.0)
Immature Granulocytes: 0 %
Lymphocytes Relative: 24 %
Lymphs Abs: 2.4 10*3/uL (ref 0.7–4.0)
MCH: 26.8 pg (ref 26.0–34.0)
MCHC: 31.5 g/dL (ref 30.0–36.0)
MCV: 85.1 fL (ref 80.0–100.0)
Monocytes Absolute: 0.7 10*3/uL (ref 0.1–1.0)
Monocytes Relative: 7 %
Neutro Abs: 6.6 10*3/uL (ref 1.7–7.7)
Neutrophils Relative %: 66 %
Platelets: 195 10*3/uL (ref 150–400)
RBC: 4.7 MIL/uL (ref 3.87–5.11)
RDW: 15.5 % (ref 11.5–15.5)
WBC: 10 10*3/uL (ref 4.0–10.5)
nRBC: 0 % (ref 0.0–0.2)

## 2020-06-24 NOTE — Assessment & Plan Note (Addendum)
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  Recommend further imaging with MRI with contrast.  Understands that she will need further work-up including a possible endoscopic ultrasound if a pancreatic mass is noted.  # Abdominal pain-currently resolved.  Question periumbilical hernia.  Unlikely due to above pancreatic mass.  #Recommend checking CA 19-9; CBC CMP.  MRI abdomen  Thank you Dr.Piscoya for allowing me to participate in the care of your pleasant patient. Please do not hesitate to contact me with questions or concerns in the interim.  # DISPOSITION: # labs- today- order cbc/cmp/ca-19-9 # MRI abdomen ASAP [not April 5th] # MD-Virtual visit 1-2 days after MRI is done- Dr.B  # I reviewed the blood work- with the patient in detail; also reviewed the imaging independently [as summarized above]; and with the patient in detail.

## 2020-06-24 NOTE — Progress Notes (Signed)
[place] NOTE  Patient Care Team: Delsa Grana, PA-C as PCP - General (Family Medicine) Loney Loh, Michaell Cowing, MD as Referring Physician (Endocrinology) Olean Ree, MD as Consulting Physician (General Surgery)  CHIEF COMPLAINTS/PURPOSE OF CONSULTATION: Pancreatic mass  # FEB 28th 2022-  Ill-defined hypodense area seen within the pancreatic head which appears to abut the duodenum and adjacent to coarse calcifications. This is nonspecific, could be small umbilical hernia.  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 Would recommend MRI with contrast/EUS for further evaluation. 2. Duodenal diverticula with focal wall thickening/narrowing which could be due to mild duodenitis. 3. Cholelithiasis 4. Diverticulosis without diverticulitis. 5. 8 mm pulmonary nodule within the right lung base. Non-contrast chest CT at 6-12 months is recommended. If the nodule is stable at time of repeat CT, then future CT at 18-24 months (from today's scan) is considered optional for low-risk patients, but is recommended for high-risk patients. This recommendation follows the consensus statement: Guidelines for Management of Incidental Pulmonary Nodules Detected on CT Images: From the Fleischner Society 2017; Radiology 2017; 284:228-243. 6.  Aortic Atherosclerosis (ICD10-I70.0).  # Goiter [Dr.Clemmons; [facility]: now with [facility]    Oncology History   No history exists.     HISTORY OF PRESENTING ILLNESS:  Tracy Bolton 80 y.o.  female has been referred to [place] for recommendations for a pancreatic mass.  Patient was recently evaluated by surgery for ongoing abdominal pain likely related to umbilical hernia.  Patient abdominal discomfort was also improved after using PPI.  Of note patient is CT scan in the emergency room that showed a potential pancreatic head mass.  She currently denies any nausea vomiting.  Admits to mild weight loss.  Review of Systems   Constitutional: Positive for weight loss. Negative for chills, diaphoresis, fever and malaise/fatigue.  HENT: Negative for nosebleeds and sore throat.   Eyes: Negative for double vision.  Respiratory: Negative for cough, hemoptysis, sputum production, shortness of breath and wheezing.   Cardiovascular: Negative for chest pain, palpitations, orthopnea and leg swelling.  Gastrointestinal: Positive for abdominal pain. Negative for blood in stool, constipation, diarrhea, heartburn, melena, nausea and vomiting.  Genitourinary: Negative for dysuria, frequency and urgency.  Musculoskeletal: Negative for back pain and joint pain.  Skin: Negative.  Negative for itching and rash.  Neurological: Negative for dizziness, tingling, focal weakness, weakness and headaches.  Endo/Heme/Allergies: Does not bruise/bleed easily.  Psychiatric/Behavioral: Negative for depression. The patient is not nervous/anxious and does not have insomnia.      MEDICAL HISTORY:  Past Medical History:  Diagnosis Date  . Arthritis   . Gout, chronic   . Hyperlipidemia   . Hypertension   . Osteoporosis   . Thyroid disease     SURGICAL HISTORY: Past Surgical History:  Procedure Laterality Date  . CATARACT EXTRACTION, BILATERAL  02/2018  . COLONOSCOPY  2016  . LUNG SURGERY Right 2004   biopsy normal    SOCIAL HISTORY: Social History   Socioeconomic History  . Marital status: Single    Spouse name: Not on file  . Number of children: 2  . Years of education: Not on file  . Highest education level: Some college, no degree  Occupational History  . Occupation: retired  Tobacco Use  . Smoking status: Never Smoker  . Smokeless tobacco: Never Used  Vaping Use  . Vaping Use: Never used  Substance and Sexual Activity  . Alcohol use: No    Alcohol/week: 0.0 standard drinks  . Drug use: No  .  Sexual activity: Not Currently  Other Topics Concern  . Not on file  Social History Narrative   Pt lives alone. With  [place]- [facility] [closely -daughter, salibury]. Never smoked; no alcohol. Worked for [employer]; retd [3710]    Social Determinants of Radio broadcast assistant Strain: Low Risk   . Difficulty of Paying Living Expenses: Not hard at all  Food Insecurity: No Food Insecurity  . Worried About Charity fundraiser in the Last Year: Never true  . Ran Out of Food in the Last Year: Never true  Transportation Needs: No Transportation Needs  . Lack of Transportation (Medical): No  . Lack of Transportation (Non-Medical): No  Physical Activity: Insufficiently Active  . Days of Exercise per Week: 7 days  . Minutes of Exercise per Session: 20 min  Stress: No Stress Concern Present  . Feeling of Stress : Not at all  Social Connections: Moderately Integrated  . Frequency of Communication with Friends and Family: More than three times a week  . Frequency of Social Gatherings with Friends and Family: More than three times a week  . Attends Religious Services: More than 4 times per year  . Active Member of Clubs or Organizations: Yes  . Attends Archivist Meetings: More than 4 times per year  . Marital Status: Never married  Intimate Partner Violence: Not At Risk  . Fear of Current or Ex-Partner: No  . Emotionally Abused: No  . Physically Abused: No  . Sexually Abused: No    FAMILY HISTORY: Family History  Problem Relation Age of Onset  . Bone cancer Father 16       not biological father  . Thyroid disease Sister   . Cancer Mother 57       brain tumor  . Breast cancer Neg Hx     ALLERGIES:  has No Known Allergies.  MEDICATIONS:  Current Outpatient Medications  Medication Sig Dispense Refill  . allopurinol (ZYLOPRIM) 100 MG tablet TAKE 1 TABLET BY MOUTH EVERY DAY 90 tablet 3  . amLODipine (NORVASC) 10 MG tablet TAKE 1 TABLET BY MOUTH EVERY DAY 90 tablet 3  . aspirin 81 MG tablet Take 81 mg by mouth.    Marland Kitchen atenolol (TENORMIN) 50 MG tablet TAKE 1 TABLET BY MOUTH EVERY DAY 90  tablet 3  . atorvastatin (LIPITOR) 10 MG tablet TAKE 1 TABLET BY MOUTH EVERYDAY AT BEDTIME 90 tablet 3  . metFORMIN (GLUCOPHAGE-XR) 500 MG 24 hr tablet TAKE 2 TABLETS BY MOUTH EVERY DAY 180 tablet 3  . Multiple Vitamin (MULTI-VITAMINS) TABS Take by mouth.    . nystatin (MYCOSTATIN/NYSTOP) powder APPLY TO AFFECTED AREA OF SKIN 3X A DAY AS NEEDED IN A SUFFICIENT AMOUNT 30 g 2  . pantoprazole (PROTONIX) 40 MG tablet Take 1 tablet (40 mg total) by mouth daily. 30 tablet 1   No current facility-administered medications for this visit.      Marland Kitchen  PHYSICAL EXAMINATION: ECOG PERFORMANCE STATUS: 0 - Asymptomatic  Vitals:   06/24/20 1345  BP: 102/63  Pulse: 84  Resp: 18  Temp: (!) 96.8 F (36 C)  SpO2: 98%   Filed Weights   06/24/20 1345  Weight: 205 lb (93 kg)    Physical Exam Constitutional:      Comments: Patient is alone.  Walking with a cane.  HENT:     Head: Normocephalic and atraumatic.     Mouth/Throat:     Pharynx: No oropharyngeal exudate.  Eyes:  Pupils: Pupils are equal, round, and reactive to light.  Neck:     Comments: Right-sided neck mass chronic Cardiovascular:     Rate and Rhythm: Normal rate and regular rhythm.  Pulmonary:     Effort: Pulmonary effort is normal. No respiratory distress.     Breath sounds: Normal breath sounds. No wheezing.  Abdominal:     General: Bowel sounds are normal. There is no distension.     Palpations: Abdomen is soft. There is no mass.     Tenderness: There is no abdominal tenderness. There is no guarding or rebound.  Musculoskeletal:        General: No tenderness. Normal range of motion.     Cervical back: Normal range of motion and neck supple.  Skin:    General: Skin is warm.  Neurological:     Mental Status: She is alert and oriented to person, place, and time.  Psychiatric:        Mood and Affect: Affect normal.    LABORATORY DATA:  I have reviewed the data as listed Lab Results  Component Value Date   WBC 10.0  06/24/2020   HGB 12.6 06/24/2020   HCT 40.0 06/24/2020   MCV 85.1 06/24/2020   PLT 195 06/24/2020   Recent Labs    10/06/19 1056 04/08/20 1139 05/22/20 2212 06/24/20 1452  NA 140 140 138 141  K 4.6 4.3 3.6 3.6  CL 102 103 103 107  CO2 26 24 19* 22  GLUCOSE 150* 156* 181* 166*  BUN 20 22 21 13   CREATININE 1.17* 0.96* 1.04* 1.08*  CALCIUM 10.0 10.0 10.0 9.5  GFRNONAA 45* 57* 55* 53*  GFRAA 52* 66  --   --   PROT 7.2 7.1 7.9 8.0  ALBUMIN  --   --  4.1 3.9  AST 21 17 28  191*  ALT 15 9 15  121*  ALKPHOS  --   --  55 157*  BILITOT 0.9 1.0 1.2 3.6*    RADIOGRAPHIC STUDIES: I have personally reviewed the radiological images as listed and agreed with the findings in the report. MR Abdomen W Wo Contrast  Result Date: 06/27/2020 CLINICAL DATA:  Abdominal pain.  Pancreatic lesion on recent CT. EXAM: MRI ABDOMEN WITHOUT AND WITH CONTRAST TECHNIQUE: Multiplanar multisequence MR imaging of the abdomen was performed both before and after the administration of intravenous contrast. CONTRAST:  41mL GADAVIST GADOBUTROL 1 MMOL/ML IV SOLN COMPARISON:  CT on 05/23/2020 FINDINGS: Lower chest: No acute findings. Hepatobiliary: No hepatic masses identified. A few tiny sub-cm cysts are noted in the left lobe. Gallbladder is dilated and contains numerous tiny less than 1 cm gallstones. No evidence of acute cholecystitis. Diffuse biliary ductal dilatation is seen, with common bile duct measuring 10 mm. Stricture of the distal common bile duct is seen at the level the pancreatic head. Pancreas: Mild pancreatic ductal dilatation is seen to the level of the pancreatic head. A poorly defined hypovascular mass is seen in the pancreatic head which measures 3.5 x 3.2 cm on image 57/22. This shows no evidence of vascular encasement. No evidence peripancreatic inflammatory changes or fluid collections. Spleen:  Within normal limits in size and appearance. Adrenals/Urinary Tract: No masses identified. A few tiny sub-cm  left renal cysts are noted. No evidence of hydronephrosis. Stomach/Bowel: Visualized portion unremarkable. Vascular/Lymphatic: 1 cm peripancreatic lymph node seen along the anterior and superior margin of the pancreatic head, suspicious for metastatic disease. No other pathologically enlarged lymph nodes identified. No abdominal aortic  aneurysm. Other:  None. Musculoskeletal:  No suspicious bone lesions identified. IMPRESSION: 3.5 cm ill-defined hypovascular mass in the pancreatic head, highly suspicious for pancreatic adenocarcinoma. This causes mild diffuse biliary and pancreatic ductal dilatation. 1 cm peripancreatic lymph node, suspicious for metastatic disease. Distended gallbladder with numerous small gallstones. No radiographic evidence of acute cholecystitis. These results will be called to the ordering clinician or representative by the Radiologist Assistant, and communication documented in the PACS or [employer]. Electronically Signed   By: Marlaine Hind M.D.   On: 06/27/2020 15:27    ASSESSMENT & PLAN:   Pancreatic mass # Pancreas mass- Ill-defined hypodense area seen within the pancreatic head -abutting the duodenum-question later chronic pancreatitis versus pancreatic neoplasm.  Recommend further imaging with MRI with contrast.  Understands that she will need further work-up including a possible endoscopic ultrasound if a pancreatic mass is noted.  # Abdominal pain-currently resolved.  Question periumbilical hernia.  Unlikely due to above pancreatic mass.  #Recommend checking CA 19-9; CBC CMP.  MRI abdomen  Thank you Dr.Piscoya for allowing me to participate in the care of your pleasant patient. Please do not hesitate to contact me with questions or concerns in the interim.  # DISPOSITION: # labs- today- order cbc/cmp/ca-19-9 # MRI abdomen ASAP [not April 5th] # MD-Virtual visit 1-2 days after MRI is done- Dr.B  # I reviewed the blood work- with the patient in detail; also  reviewed the imaging independently [as summarized above]; and with the patient in detail.   All questions were answered. The patient knows to call the clinic with any problems, questions or concerns.    Cammie Sickle, MD 06/28/2020 7:56 PM

## 2020-06-25 LAB — CANCER ANTIGEN 19-9: CA 19-9: 64 U/mL — ABNORMAL HIGH (ref 0–35)

## 2020-06-27 ENCOUNTER — Telehealth: Payer: Self-pay | Admitting: *Deleted

## 2020-06-27 ENCOUNTER — Other Ambulatory Visit: Payer: Self-pay

## 2020-06-27 ENCOUNTER — Ambulatory Visit
Admission: RE | Admit: 2020-06-27 | Discharge: 2020-06-27 | Disposition: A | Discharge status: Home / Self Care | Payer: Medicare PPO | Source: Ambulatory Visit | Attending: Internal Medicine | Admitting: Internal Medicine

## 2020-06-27 DIAGNOSIS — K862 Cyst of pancreas: Secondary | ICD-10-CM

## 2020-06-27 MED ORDER — GADOBUTROL 1 MMOL/ML IV SOLN
9.0000 mL | Freq: Once | INTRAVENOUS | Status: AC | PRN
  Administered 2020-06-27: 9 mL via INTRAVENOUS

## 2020-06-27 NOTE — Telephone Encounter (Signed)
Called report  IMPRESSION: 3.5 cm ill-defined hypovascular mass in the pancreatic head, highly suspicious for pancreatic adenocarcinoma. This causes mild diffuse biliary and pancreatic ductal dilatation.  1 cm peripancreatic lymph node, suspicious for metastatic disease.  Distended gallbladder with numerous small gallstones. No radiographic evidence of acute cholecystitis.  These results will be called to the ordering clinician or representative by the Radiologist Assistant, and communication documented in the PACS or [employer].   Electronically Signed   By: Marlaine Hind M.D.   On: 06/27/2020 15:27

## 2020-06-28 ENCOUNTER — Telehealth: Payer: Self-pay | Admitting: Internal Medicine

## 2020-06-28 NOTE — Telephone Encounter (Signed)
On 4/5-spoke to patient regarding the results of the elevated LFTs/mass in the pancreas.  Recommend further evaluation with GI-likely need ERCP/EUS.  Recommend urgent evaluation at [facility]/given the logistics.  Patient agreement.  Discussed with Drue Dun; will initiate the referral-urgent.  Recommend patient keep her appointment tomorrow as planned; patient wants to keep this virtual visit.   GB

## 2020-06-28 NOTE — Telephone Encounter (Signed)
Called and spoke with Tracy Bolton regarding ERCP/EUS. Educated on procedures further. All questions answered. Referral has been sent urgently to [facility]. I have contacted the advanced endoscopy scheduling department. They have received referral and will review and contact Tracy Bolton for scheduling today.

## 2020-06-29 ENCOUNTER — Other Ambulatory Visit: Payer: Self-pay | Admitting: *Deleted

## 2020-06-29 ENCOUNTER — Other Ambulatory Visit: Payer: Self-pay

## 2020-06-29 ENCOUNTER — Inpatient Hospital Stay (HOSPITAL_BASED_OUTPATIENT_CLINIC_OR_DEPARTMENT_OTHER): Payer: Medicare PPO | Admitting: Internal Medicine

## 2020-06-29 DIAGNOSIS — K8689 Other specified diseases of pancreas: Secondary | ICD-10-CM

## 2020-06-29 NOTE — Progress Notes (Signed)
I connected with [name] on 06/29/20 at  2:45 PM EDT by video enabled telemedicine visit and verified that I am speaking with the correct person using two identifiers.  I discussed the limitations, risks, security and privacy concerns of performing an evaluation and management service by telemedicine and the availability of in-person appointments. I also discussed with the patient that there may be a patient responsible charge related to this service. The patient expressed understanding and agreed to proceed.    Other persons participating in the visit and their role in the encounter: RN/medical reconciliation Patient's location: home Provider's location: office  Oncology History   No history exists.     Chief Complaint: pancreatic mass   History of present illness:Tracy Bolton 80 y.o.  female with history of recently diagnosed pancreatic mass/elevated LFTs she is here today with results of MRI.  Patient denies any worsening abdominal pain.  Denies any nausea vomiting.  Appetite is fair.  Observation/objective: Alert & oriented x 3. In No acute distress.   Assessment and plan: Pancreatic mass #Pancreatic mass highly suspicious for malignancy based on MRI-3.5 cm ill-defined hypovascular mass in the pancreatic head, highly suspicious for pancreatic adenocarcinoma. This causes mild diffuse biliary and pancreatic ductal dilatation.  1 cm peripancreatic lymph node, suspicious for metastatic disease.elevated tumor marker 98.  Recommend urgent evaluation with GI/endoscopic ultrasound.  #Elevated LFTs bilirubin-3.8 suspicious for biliary obstruction from underlying pancreatic mass.  Recommend urgent ERCP.  #I offered to speak to patient's daughter; patient states that daughter is aware; declines.    # Discussed with Roderic Ovens; patient awaiting EUS/ERCP on April 8th.   # DISPOSITION # follow up on 4/12 at 11:15; MD; labs- cbc/cmp/ca-19-9-Dr.B  Follow-up  instructions:  I discussed the assessment and treatment plan with the patient.  The patient was provided an opportunity to ask questions and all were answered.  The patient agreed with the plan and demonstrated understanding of instructions.  The patient was advised to call back or seek an in person evaluation if the symptoms worsen or if the condition fails to improve as anticipated.   Dr. Charlaine Dalton [place] [facility] 06/29/2020 3:31 PM

## 2020-06-29 NOTE — Assessment & Plan Note (Addendum)
#  Pancreatic mass highly suspicious for malignancy based on MRI-3.5 cm ill-defined hypovascular mass in the pancreatic head, highly suspicious for pancreatic adenocarcinoma. This causes mild diffuse biliary and pancreatic ductal dilatation.  1 cm peripancreatic lymph node, suspicious for metastatic disease.elevated tumor marker 98.  Recommend urgent evaluation with GI/endoscopic ultrasound.  #Elevated LFTs bilirubin-3.8 suspicious for biliary obstruction from underlying pancreatic mass.  Recommend urgent ERCP.  #I offered to speak to patient's daughter; patient states that daughter is aware; declines.    # Discussed with Roderic Ovens; patient awaiting EUS/ERCP on April 8th.   # DISPOSITION # follow up on 4/12 at 11:15; MD; labs- cbc/cmp/ca-19-9-Dr.B

## 2020-07-04 ENCOUNTER — Encounter: Payer: Self-pay | Admitting: Gastroenterology

## 2020-07-04 ENCOUNTER — Telehealth: Payer: Self-pay | Admitting: Internal Medicine

## 2020-07-04 NOTE — Telephone Encounter (Signed)
On 4/08-discussed with Dr.Spaete, GI [facility].  Unable to perform ERCP/EUS because of obstruction likely from the mass.  Patient will need biliary drainage; will need IR evaluation.  Dr. Cephas Darby will make the referral for IR at [facility].   On 4/11-discussed with Drue Dun- will follow with the patient regarding biliary drain procedure.  Thanks GB

## 2020-07-05 ENCOUNTER — Telehealth: Payer: Self-pay

## 2020-07-05 ENCOUNTER — Encounter: Payer: Self-pay | Admitting: Internal Medicine

## 2020-07-05 ENCOUNTER — Other Ambulatory Visit: Payer: Self-pay | Admitting: Internal Medicine

## 2020-07-05 ENCOUNTER — Inpatient Hospital Stay: Payer: Medicare PPO

## 2020-07-05 ENCOUNTER — Inpatient Hospital Stay (HOSPITAL_BASED_OUTPATIENT_CLINIC_OR_DEPARTMENT_OTHER): Payer: Medicare PPO | Admitting: Internal Medicine

## 2020-07-05 DIAGNOSIS — C259 Malignant neoplasm of pancreas, unspecified: Secondary | ICD-10-CM

## 2020-07-05 DIAGNOSIS — K8689 Other specified diseases of pancreas: Secondary | ICD-10-CM

## 2020-07-05 LAB — COMPREHENSIVE METABOLIC PANEL
ALT: 100 U/L — ABNORMAL HIGH (ref 0–44)
AST: 169 U/L — ABNORMAL HIGH (ref 15–41)
Albumin: 3.9 g/dL (ref 3.5–5.0)
Alkaline Phosphatase: 172 U/L — ABNORMAL HIGH (ref 38–126)
Anion gap: 15 (ref 5–15)
BUN: 13 mg/dL (ref 8–23)
CO2: 23 mmol/L (ref 22–32)
Calcium: 9.4 mg/dL (ref 8.9–10.3)
Chloride: 102 mmol/L (ref 98–111)
Creatinine, Ser: 0.97 mg/dL (ref 0.44–1.00)
GFR, Estimated: >60 mL/min (ref >60)
Glucose, Bld: 129 mg/dL — ABNORMAL HIGH (ref 70–99)
Potassium: 3.7 mmol/L (ref 3.5–5.1)
Sodium: 140 mmol/L (ref 135–145)
Total Bilirubin: 4.6 mg/dL — ABNORMAL HIGH (ref 0.3–1.2)
Total Protein: 8 g/dL (ref 6.5–8.1)

## 2020-07-05 LAB — CBC WITH DIFFERENTIAL/PLATELET
Abs Immature Granulocytes: 0.02 10*3/uL (ref 0.00–0.07)
Basophils Absolute: 0 10*3/uL (ref 0.0–0.1)
Basophils Relative: 1 %
Eosinophils Absolute: 0.2 10*3/uL (ref 0.0–0.5)
Eosinophils Relative: 2 %
HCT: 39.4 % (ref 36.0–46.0)
Hemoglobin: 12.7 g/dL (ref 12.0–15.0)
Immature Granulocytes: 0 %
Lymphocytes Relative: 26 %
Lymphs Abs: 2.3 10*3/uL (ref 0.7–4.0)
MCH: 27.1 pg (ref 26.0–34.0)
MCHC: 32.2 g/dL (ref 30.0–36.0)
MCV: 84 fL (ref 80.0–100.0)
Monocytes Absolute: 0.7 10*3/uL (ref 0.1–1.0)
Monocytes Relative: 7 %
Neutro Abs: 5.6 10*3/uL (ref 1.7–7.7)
Neutrophils Relative %: 64 %
Platelets: 204 10*3/uL (ref 150–400)
RBC: 4.69 MIL/uL (ref 3.87–5.11)
RDW: 15.9 % — ABNORMAL HIGH (ref 11.5–15.5)
WBC: 8.9 10*3/uL (ref 4.0–10.5)
nRBC: 0 % (ref 0.0–0.2)

## 2020-07-05 MED ORDER — PROCHLORPERAZINE MALEATE 10 MG PO TABS: 10.0000 mg | ORAL_TABLET | Freq: Four times a day (QID) | ORAL | 1 refills | Status: DC | PRN

## 2020-07-05 MED ORDER — LIDOCAINE-PRILOCAINE 2.5-2.5 % EX CREA: TOPICAL_CREAM | CUTANEOUS | 0 refills | Status: DC

## 2020-07-05 MED ORDER — PANTOPRAZOLE SODIUM 40 MG PO TBEC: 40.0000 mg | DELAYED_RELEASE_TABLET | Freq: Every day | ORAL | 3 refills | Status: AC

## 2020-07-05 NOTE — Assessment & Plan Note (Addendum)
#  Pancreatic mass highly suspicious for malignancy based on MRI-3.5 cm ill-defined hypovascular mass in the pancreatic head, highly suspicious for pancreatic adenocarcinoma.  No vascular invasion noted.  1 cm peripancreatic lymph node, suspicious for metastatic disease.elevated tumor marker 98. Duodenal Biopsy- Invasive adenocarcinoma, moderately differentiated.   # I would recommend a PET scan for further staging to rule out any distant metastatic disease.  #Discussed options of chemotherapy; surgery with the patient/daughter.  However, will decide on sequence of therapies/specifics of chemotherapy based upon review of PET scan.  We will also make a referral to [facility] surgery, Dr. Manuella Ghazi.   #Elevated LFTs bilirubin-3.8; worsening bilirubin today 4.6.  Discussed with Dr. Pascal Lux IR -plan for biliary drain.  As per the discussion patient will tentatively be admitted to the hospital on 4/14; will plan for the procedure on 4/15.   # Chemotherapy education; Hopefully the planned start chemotherapy next 2 weeks.  Antiemetics-Zofran and Compazine; EMLA cream sent to pharmacy.    # DISPOSITION # chemo education ASAP # PET scan ASAP  #  Follow up -MD ;labs- cbc/ cmp; ca-19-9; in 1-2 days after the PET scan- Dr.B  Addendum: Discussed with Dr. Adolph Pollack agrees to evaluate the patient ASAP; plan to review images-in fact might consider upfront surgery resectable to avoid duodenal obstruction.  Informed Mathis Fare.  We will make a referral to Dr. Manuella Ghazi.  # 40 minutes face-to-face with the patient discussing the above plan of care; more than 50% of time spent on prognosis/ natural history; counseling and coordination.

## 2020-07-05 NOTE — Progress Notes (Signed)
[place] NOTE  Patient Care Team: Delsa Grana, PA-C as PCP - General (Family Medicine) Loney Loh, Michaell Cowing, MD as Referring Physician (Endocrinology) Olean Ree, MD as Consulting Physician (General Surgery)  CHIEF COMPLAINTS/PURPOSE OF CONSULTATION: Pancreatic cancer    Oncology History Overview Note  # FEB 28th 2022-  Ill-defined hypodense area seen within the pancreatic head which appears to abut the duodenum and adjacent to coarse calcifications. 2. Duodenal diverticula with focal wall thickening/narrowing which; MRI Abdomen- 3.5 cm ill-defined hypovascular mass in the pancreatic head, highly suspicious for pancreatic adenocarcinoma. This causes mild diffuse biliary and pancreatic ductal dilatation. 1 cm peripancreatic lymph node, suspicious for metastatic disease.  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   could be due to mild duodenitis. 5. 8 mm pulmonary nodule within the right lung base. N # Goiter [Dr.Clemmons; [facility]: now with [facility]]    Primary pancreatic cancer ([place])  07/05/2020 Initial Diagnosis   Primary pancreatic cancer ([place])      HISTORY OF PRESENTING ILLNESS:  Tracy Bolton 80 y.o.  female with newly diagnosed pancreatic mass; obstructive jaundice is here to review the results of her biopsy done at [facility].  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  Patient is defer to IR for biliary drain.  Patient denies any difficulty in swallowing.  Denies any nausea vomiting.  Denies any worsening abdominal pain.   Review of Systems  Constitutional: Negative for chills, diaphoresis, fever and malaise/fatigue.  HENT: Negative for nosebleeds and sore throat.   Eyes: Negative for double vision.  Respiratory: Negative for cough, hemoptysis, sputum production,  shortness of breath and wheezing.   Cardiovascular: Negative for chest pain, palpitations, orthopnea and leg swelling.  Gastrointestinal: Negative for blood in stool, constipation, diarrhea, heartburn, melena, nausea and vomiting.  Genitourinary: Negative for dysuria, frequency and urgency.  Musculoskeletal: Positive for back pain and joint pain.  Skin: Negative.  Negative for itching and rash.  Neurological: Negative for dizziness, tingling, focal weakness, weakness and headaches.  Endo/Heme/Allergies: Does not bruise/bleed easily.  Psychiatric/Behavioral: Negative for depression. The patient is not nervous/anxious and does not have insomnia.      MEDICAL HISTORY:  Past Medical History:  Diagnosis Date  . Arthritis   . Gout, chronic   . Hyperlipidemia   . Hypertension   . Osteoporosis   . Thyroid disease     SURGICAL HISTORY: Past Surgical History:  Procedure Laterality Date  . CATARACT EXTRACTION, BILATERAL  02/2018  . COLONOSCOPY  2016  . LUNG SURGERY Right 2004   biopsy normal    SOCIAL HISTORY: Social History   Socioeconomic History  . Marital status: Single    Spouse name: Not on file  . Number of children: 2  . Years of education: Not on file  . Highest education level: Some college, no degree  Occupational History  . Occupation: retired  Tobacco Use  . Smoking status: Never Smoker  . Smokeless tobacco: Never Used  Vaping Use  . Vaping Use: Never used  Substance and Sexual Activity  . Alcohol use: No    Alcohol/week: 0.0 standard drinks  . Drug use: No  . Sexual activity: Not Currently  Other Topics Concern  . Not on file  Social History Narrative   Pt lives alone. With [place]- med alert [closely -daughter, salibury]. Never smoked; no alcohol. Worked for [employer]; retd [2005]  Social Determinants of Health   Financial Resource Strain: Low Risk   . Difficulty of Paying Living Expenses: Not hard at all  Food Insecurity: No Food Insecurity  .  Worried About Charity fundraiser in the Last Year: Never true  . Ran Out of Food in the Last Year: Never true  Transportation Needs: No Transportation Needs  . Lack of Transportation (Medical): No  . Lack of Transportation (Non-Medical): No  Physical Activity: Insufficiently Active  . Days of Exercise per Week: 7 days  . Minutes of Exercise per Session: 20 min  Stress: No Stress Concern Present  . Feeling of Stress : Not at all  Social Connections: Moderately Integrated  . Frequency of Communication with Friends and Family: More than three times a week  . Frequency of Social Gatherings with Friends and Family: More than three times a week  . Attends Religious Services: More than 4 times per year  . Active Member of Clubs or Organizations: Yes  . Attends Archivist Meetings: More than 4 times per year  . Marital Status: Never married  Intimate Partner Violence: Not At Risk  . Fear of Current or Ex-Partner: No  . Emotionally Abused: No  . Physically Abused: No  . Sexually Abused: No    FAMILY HISTORY: Family History  Problem Relation Age of Onset  . Bone cancer Father 58       not biological father  . Thyroid disease Sister   . Cancer Mother 48       brain tumor  . Breast cancer Neg Hx     ALLERGIES:  has No Known Allergies.  MEDICATIONS:  Current Outpatient Medications  Medication Sig Dispense Refill  . allopurinol (ZYLOPRIM) 100 MG tablet TAKE 1 TABLET BY MOUTH EVERY DAY 90 tablet 3  . amLODipine (NORVASC) 10 MG tablet TAKE 1 TABLET BY MOUTH EVERY DAY 90 tablet 3  . aspirin 81 MG tablet Take 81 mg by mouth.    Marland Kitchen atenolol (TENORMIN) 50 MG tablet TAKE 1 TABLET BY MOUTH EVERY DAY 90 tablet 3  . atorvastatin (LIPITOR) 10 MG tablet TAKE 1 TABLET BY MOUTH EVERYDAY AT BEDTIME 90 tablet 3  . lidocaine-prilocaine (EMLA) cream Apply 30 -45 mins prior to port access. 30 g 0  . metFORMIN (GLUCOPHAGE-XR) 500 MG 24 hr tablet TAKE 2 TABLETS BY MOUTH EVERY DAY 180 tablet 3   . Multiple Vitamin (MULTI-VITAMINS) TABS Take 1 tablet by mouth daily.    Marland Kitchen nystatin (MYCOSTATIN/NYSTOP) powder APPLY TO AFFECTED AREA OF SKIN 3X A DAY AS NEEDED IN A SUFFICIENT AMOUNT 30 g 2  . prochlorperazine (COMPAZINE) 10 MG tablet Take 1 tablet (10 mg total) by mouth every 6 (six) hours as needed for nausea or vomiting. 40 tablet 1  . pantoprazole (PROTONIX) 40 MG tablet Take 1 tablet (40 mg total) by mouth daily. 30 tablet 3   No current facility-administered medications for this visit.      Marland Kitchen  PHYSICAL EXAMINATION: ECOG PERFORMANCE STATUS: 0 - Asymptomatic  Vitals:   07/05/20 1051  BP: 106/63  Pulse: 84  Resp: 17  Temp: (!) 97.5 F (36.4 C)  SpO2: 99%   Filed Weights   07/05/20 1051  Weight: 199 lb 12.8 oz (90.6 kg)    Physical Exam Constitutional:      Comments: Patient is accompanied by daughter.  Walking with a cane.   HENT:     Head: Normocephalic and atraumatic.     Mouth/Throat:  Pharynx: No oropharyngeal exudate.  Eyes:     Pupils: Pupils are equal, round, and reactive to light.  Neck:     Comments: Right-sided neck mass chronic Cardiovascular:     Rate and Rhythm: Normal rate and regular rhythm.  Pulmonary:     Effort: Pulmonary effort is normal. No respiratory distress.     Breath sounds: Normal breath sounds. No wheezing.  Abdominal:     General: Bowel sounds are normal. There is no distension.     Palpations: Abdomen is soft. There is no mass.     Tenderness: There is no abdominal tenderness. There is no guarding or rebound.  Musculoskeletal:        General: No tenderness. Normal range of motion.     Cervical back: Normal range of motion and neck supple.  Skin:    General: Skin is warm.  Neurological:     Mental Status: She is alert and oriented to person, place, and time.  Psychiatric:        Mood and Affect: Affect normal.    LABORATORY DATA:  I have reviewed the data as listed Lab Results  Component Value Date   WBC 8.9  07/05/2020   HGB 12.7 07/05/2020   HCT 39.4 07/05/2020   MCV 84.0 07/05/2020   PLT 204 07/05/2020   Recent Labs    10/06/19 1056 04/08/20 1139 05/22/20 2212 06/24/20 1452 07/05/20 1033  NA 140 140 138 141 140  K 4.6 4.3 3.6 3.6 3.7  CL 102 103 103 107 102  CO2 26 24 19* 22 23  GLUCOSE 150* 156* 181* 166* 129*  BUN 20 22 21 13 13   CREATININE 1.17* 0.96* 1.04* 1.08* 0.97  CALCIUM 10.0 10.0 10.0 9.5 9.4  GFRNONAA 45* 57* 55* 53* >60  GFRAA 52* 66  --   --   --   PROT 7.2 7.1 7.9 8.0 8.0  ALBUMIN  --   --  4.1 3.9 3.9  AST 21 17 28  191* 169*  ALT 15 9 15  121* 100*  ALKPHOS  --   --  55 157* 172*  BILITOT 0.9 1.0 1.2 3.6* 4.6*    RADIOGRAPHIC STUDIES: I have personally reviewed the radiological images as listed and agreed with the findings in the report. MR Abdomen W Wo Contrast  Result Date: 06/27/2020 CLINICAL DATA:  Abdominal pain.  Pancreatic lesion on recent CT. EXAM: MRI ABDOMEN WITHOUT AND WITH CONTRAST TECHNIQUE: Multiplanar multisequence MR imaging of the abdomen was performed both before and after the administration of intravenous contrast. CONTRAST:  72mL GADAVIST GADOBUTROL 1 MMOL/ML IV SOLN COMPARISON:  CT on 05/23/2020 FINDINGS: Lower chest: No acute findings. Hepatobiliary: No hepatic masses identified. A few tiny sub-cm cysts are noted in the left lobe. Gallbladder is dilated and contains numerous tiny less than 1 cm gallstones. No evidence of acute cholecystitis. Diffuse biliary ductal dilatation is seen, with common bile duct measuring 10 mm. Stricture of the distal common bile duct is seen at the level the pancreatic head. Pancreas: Mild pancreatic ductal dilatation is seen to the level of the pancreatic head. A poorly defined hypovascular mass is seen in the pancreatic head which measures 3.5 x 3.2 cm on image 57/22. This shows no evidence of vascular encasement. No evidence peripancreatic inflammatory changes or fluid collections. Spleen:  Within normal limits in size  and appearance. Adrenals/Urinary Tract: No masses identified. A few tiny sub-cm left renal cysts are noted. No evidence of hydronephrosis. Stomach/Bowel: Visualized portion unremarkable. Vascular/Lymphatic:  1 cm peripancreatic lymph node seen along the anterior and superior margin of the pancreatic head, suspicious for metastatic disease. No other pathologically enlarged lymph nodes identified. No abdominal aortic aneurysm. Other:  None. Musculoskeletal:  No suspicious bone lesions identified. IMPRESSION: 3.5 cm ill-defined hypovascular mass in the pancreatic head, highly suspicious for pancreatic adenocarcinoma. This causes mild diffuse biliary and pancreatic ductal dilatation. 1 cm peripancreatic lymph node, suspicious for metastatic disease. Distended gallbladder with numerous small gallstones. No radiographic evidence of acute cholecystitis. These results will be called to the ordering clinician or representative by the Radiologist Assistant, and communication documented in the PACS or [employer]. Electronically Signed   By: Marlaine Hind M.D.   On: 06/27/2020 15:27    ASSESSMENT & PLAN:   Primary pancreatic cancer [facility]) #Pancreatic mass highly suspicious for malignancy based on MRI-3.5 cm ill-defined hypovascular mass in the pancreatic head, highly suspicious for pancreatic adenocarcinoma.  No vascular invasion noted.  1 cm peripancreatic lymph node, suspicious for metastatic disease.elevated tumor marker 98. Duodenal Biopsy- Invasive adenocarcinoma, moderately differentiated.   # I would recommend a PET scan for further staging to rule out any distant metastatic disease.  #Discussed options of chemotherapy; surgery with the patient/daughter.  However, will decide on sequence of therapies/specifics of chemotherapy based upon review of PET scan.  We will also make a referral to [facility] surgery, Dr. Manuella Ghazi.   #Elevated LFTs bilirubin-3.8; worsening bilirubin today 4.6.  Discussed with Dr. Pascal Lux IR  -plan for biliary drain.  As per the discussion patient will tentatively be admitted to the hospital on 4/14; will plan for the procedure on 4/15.   # Chemotherapy education; Hopefully the planned start chemotherapy next 2 weeks.  Antiemetics-Zofran and Compazine; EMLA cream sent to pharmacy.    # DISPOSITION # chemo education ASAP # PET scan ASAP  #  Follow up -MD ;labs- cbc/ cmp; ca-19-9; in 1-2 days after the PET scan- Dr.B  Addendum: Discussed with Dr. Adolph Pollack agrees to evaluate the patient ASAP; plan to review images-in fact might consider upfront surgery resectable to avoid duodenal obstruction.  Informed Mathis Fare.  We will make a referral to Dr. Manuella Ghazi.  # 40 minutes face-to-face with the patient discussing the above plan of care; more than 50% of time spent on prognosis/ natural history; counseling and coordination.   All questions were answered. The patient knows to call the clinic with any problems, questions or concerns.    Cammie Sickle, MD 07/05/2020 12:53 PM

## 2020-07-05 NOTE — Telephone Encounter (Signed)
Urgent referral sent to Dr. Candyce Churn. Request made with radiology to send recent CT/MRI to [facility].

## 2020-07-05 NOTE — Assessment & Plan Note (Deleted)
#  Pancreatic mass highly suspicious for malignancy based on MRI-3.5 cm ill-defined hypovascular mass in the pancreatic head, highly suspicious for pancreatic adenocarcinoma. This causes mild diffuse biliary and pancreatic ductal dilatation; 1 cm peripancreatic lymph node, suspicious for metastatic disease.elevated tumor marker 98. Duodenal Biopsy- Invasive adenocarcinoma, moderately differentiated.    Comment: The patient's history of pancreatic mass is noted. The finding may represent extension of pancreatic ductal adenocarcinoma into the duodenum with mucosal colonization by cancer cells.  #Unable to proceed with EUS/ERCP because of duodenal obstruction from malignancy; recommend a PET scan for further evaluation.   #Elevated LFTs bilirubin-3.8 suspicious for biliary obstruction from underlying pancreatic mass.  Recommend urgent ERCP.  #I offered to speak to patient's daughter; patient states that daughter is aware; declines.    # Discussed with Roderic Ovens; patient awaiting EUS/ERCP on April 8th.   # DISPOSITION # chemo education ASAP # PET scan ASAP # follow up on 4/12 at 11:15; MD; labs- cbc/cmp/ca-19-9-Dr.B

## 2020-07-05 NOTE — Progress Notes (Signed)
Patient here for oncology follow-up appointment, expresses no complaints or concerns at this time.    

## 2020-07-06 ENCOUNTER — Other Ambulatory Visit: Payer: Self-pay | Admitting: Internal Medicine

## 2020-07-06 ENCOUNTER — Telehealth: Payer: Self-pay | Admitting: *Deleted

## 2020-07-06 DIAGNOSIS — K831 Obstruction of bile duct: Secondary | ICD-10-CM

## 2020-07-06 DIAGNOSIS — C259 Malignant neoplasm of pancreas, unspecified: Secondary | ICD-10-CM

## 2020-07-06 LAB — CANCER ANTIGEN 19-9: CA 19-9: 71 U/mL — ABNORMAL HIGH (ref 0–35)

## 2020-07-06 NOTE — Telephone Encounter (Signed)
I contacted the patient. She will come to cancer center at 1:30 pm tom. Lab apt entered.  Lauren/ Jenny/ Dr. Grayland Ormond. - lab orders entered. I will need a provider to call the hospitalist in the morning to arrange for admission.  Pt is aware and agreeable to plan.

## 2020-07-06 NOTE — Telephone Encounter (Signed)
Incoming msg from Dr. Rogue Bussing   Jenny/Lauren-   Since I'm on PAL- I would need your help admitting this pt to hospital under the hospitalist service tomorrow for placement of biliary drain for biliary obstruction.  pt will likely be discharged over the weekend if everything goes well.   I have spoken to Dr. Pascal Lux, he plans to do the procedure on 4/15. The orders for the procedure are in. pt will need to be NPO; labs- cbc/cmp/PT/PTT.   Nira Conn- you can inform the pt to come to cancer center or registration tomorrow at 1:30 for admission.   Thanks,  GB  FYI-Tim- i just added you as you are on call.

## 2020-07-07 ENCOUNTER — Observation Stay
Admission: AD | Admit: 2020-07-07 | Discharge: 2020-07-09 | Disposition: A | Discharge status: Home / Self Care | Payer: Medicare PPO | Source: Ambulatory Visit | Attending: Family Medicine | Admitting: Family Medicine

## 2020-07-07 ENCOUNTER — Inpatient Hospital Stay: Payer: Medicare PPO

## 2020-07-07 ENCOUNTER — Other Ambulatory Visit: Payer: Self-pay | Admitting: Student

## 2020-07-07 ENCOUNTER — Encounter: Payer: Self-pay | Admitting: Internal Medicine

## 2020-07-07 ENCOUNTER — Telehealth: Payer: Self-pay | Admitting: Internal Medicine

## 2020-07-07 ENCOUNTER — Inpatient Hospital Stay (HOSPITAL_BASED_OUTPATIENT_CLINIC_OR_DEPARTMENT_OTHER): Payer: Medicare PPO | Admitting: Nurse Practitioner

## 2020-07-07 ENCOUNTER — Encounter: Payer: Self-pay | Admitting: Nurse Practitioner

## 2020-07-07 ENCOUNTER — Other Ambulatory Visit: Payer: Self-pay | Admitting: *Deleted

## 2020-07-07 ENCOUNTER — Other Ambulatory Visit: Payer: Self-pay

## 2020-07-07 VITALS — BP 99/66 | HR 70 | Temp 98.0°F | Resp 18 | Wt 200.4 lb

## 2020-07-07 DIAGNOSIS — K869 Disease of pancreas, unspecified: Secondary | ICD-10-CM | POA: Diagnosis not present

## 2020-07-07 DIAGNOSIS — Z79899 Other long term (current) drug therapy: Secondary | ICD-10-CM | POA: Insufficient documentation

## 2020-07-07 DIAGNOSIS — E1165 Type 2 diabetes mellitus with hyperglycemia: Secondary | ICD-10-CM | POA: Insufficient documentation

## 2020-07-07 DIAGNOSIS — K831 Obstruction of bile duct: Principal | ICD-10-CM | POA: Diagnosis present

## 2020-07-07 DIAGNOSIS — I10 Essential (primary) hypertension: Secondary | ICD-10-CM | POA: Diagnosis not present

## 2020-07-07 DIAGNOSIS — C259 Malignant neoplasm of pancreas, unspecified: Secondary | ICD-10-CM

## 2020-07-07 DIAGNOSIS — Z7984 Long term (current) use of oral hypoglycemic drugs: Secondary | ICD-10-CM | POA: Diagnosis not present

## 2020-07-07 DIAGNOSIS — Z7982 Long term (current) use of aspirin: Secondary | ICD-10-CM | POA: Diagnosis not present

## 2020-07-07 DIAGNOSIS — K8689 Other specified diseases of pancreas: Secondary | ICD-10-CM

## 2020-07-07 DIAGNOSIS — Z20822 Contact with and (suspected) exposure to covid-19: Secondary | ICD-10-CM | POA: Diagnosis not present

## 2020-07-07 DIAGNOSIS — M6281 Muscle weakness (generalized): Secondary | ICD-10-CM | POA: Insufficient documentation

## 2020-07-07 DIAGNOSIS — E872 Acidosis: Secondary | ICD-10-CM | POA: Insufficient documentation

## 2020-07-07 DIAGNOSIS — K219 Gastro-esophageal reflux disease without esophagitis: Secondary | ICD-10-CM | POA: Diagnosis not present

## 2020-07-07 LAB — CBC WITH DIFFERENTIAL/PLATELET
Abs Immature Granulocytes: 0.02 10*3/uL (ref 0.00–0.07)
Basophils Absolute: 0 10*3/uL (ref 0.0–0.1)
Basophils Relative: 0 %
Eosinophils Absolute: 0.2 10*3/uL (ref 0.0–0.5)
Eosinophils Relative: 3 %
HCT: 37.4 % (ref 36.0–46.0)
Hemoglobin: 12.1 g/dL (ref 12.0–15.0)
Immature Granulocytes: 0 %
Lymphocytes Relative: 20 %
Lymphs Abs: 1.9 10*3/uL (ref 0.7–4.0)
MCH: 27.1 pg (ref 26.0–34.0)
MCHC: 32.4 g/dL (ref 30.0–36.0)
MCV: 83.7 fL (ref 80.0–100.0)
Monocytes Absolute: 0.7 10*3/uL (ref 0.1–1.0)
Monocytes Relative: 8 %
Neutro Abs: 6.3 10*3/uL (ref 1.7–7.7)
Neutrophils Relative %: 69 %
Platelets: 196 10*3/uL (ref 150–400)
RBC: 4.47 MIL/uL (ref 3.87–5.11)
RDW: 15.9 % — ABNORMAL HIGH (ref 11.5–15.5)
WBC: 9.1 10*3/uL (ref 4.0–10.5)
nRBC: 0 % (ref 0.0–0.2)

## 2020-07-07 LAB — COMPREHENSIVE METABOLIC PANEL
ALT: 113 U/L — ABNORMAL HIGH (ref 0–44)
AST: 218 U/L — ABNORMAL HIGH (ref 15–41)
Albumin: 3.7 g/dL (ref 3.5–5.0)
Alkaline Phosphatase: 179 U/L — ABNORMAL HIGH (ref 38–126)
Anion gap: 16 — ABNORMAL HIGH (ref 5–15)
BUN: 13 mg/dL (ref 8–23)
CO2: 20 mmol/L — ABNORMAL LOW (ref 22–32)
Calcium: 9.4 mg/dL (ref 8.9–10.3)
Chloride: 103 mmol/L (ref 98–111)
Creatinine, Ser: 1.18 mg/dL — ABNORMAL HIGH (ref 0.44–1.00)
GFR, Estimated: 48 mL/min — ABNORMAL LOW (ref >60)
Glucose, Bld: 172 mg/dL — ABNORMAL HIGH (ref 70–99)
Potassium: 3.6 mmol/L (ref 3.5–5.1)
Sodium: 139 mmol/L (ref 135–145)
Total Bilirubin: 5.6 mg/dL — ABNORMAL HIGH (ref 0.3–1.2)
Total Protein: 7.8 g/dL (ref 6.5–8.1)

## 2020-07-07 LAB — GLUCOSE, CAPILLARY: Glucose-Capillary: 117 mg/dL — ABNORMAL HIGH (ref 70–99)

## 2020-07-07 LAB — APTT: aPTT: 24 seconds (ref 24–36)

## 2020-07-07 LAB — PROTIME-INR
INR: 1.2 (ref 0.8–1.2)
Prothrombin Time: 14.9 seconds (ref 11.4–15.2)

## 2020-07-07 MED ORDER — ALLOPURINOL 100 MG PO TABS
100.0000 mg | ORAL_TABLET | Freq: Every day | ORAL | Status: DC
  Administered 2020-07-09: 100 mg via ORAL
  Filled 2020-07-07: qty 1

## 2020-07-07 MED ORDER — PROCHLORPERAZINE EDISYLATE 10 MG/2ML IJ SOLN
10.0000 mg | Freq: Four times a day (QID) | INTRAMUSCULAR | Status: DC | PRN
  Filled 2020-07-07: qty 2

## 2020-07-07 MED ORDER — OXYCODONE HCL 5 MG PO TABS
5.0000 mg | ORAL_TABLET | Freq: Four times a day (QID) | ORAL | Status: DC | PRN
  Administered 2020-07-08 – 2020-07-09 (×3): 5 mg via ORAL
  Filled 2020-07-07 (×3): qty 1

## 2020-07-07 MED ORDER — SENNA 8.6 MG PO TABS: 1.0000 | ORAL_TABLET | Freq: Every evening | ORAL | Status: DC | PRN

## 2020-07-07 MED ORDER — INSULIN ASPART 100 UNIT/ML ~~LOC~~ SOLN: 0.0000 [IU] | Freq: Three times a day (TID) | SUBCUTANEOUS | Status: DC

## 2020-07-07 MED ORDER — PANTOPRAZOLE SODIUM 40 MG PO TBEC
40.0000 mg | DELAYED_RELEASE_TABLET | Freq: Every day | ORAL | Status: DC
  Administered 2020-07-09: 10:00:00 40 mg via ORAL
  Filled 2020-07-07: qty 1

## 2020-07-07 MED ORDER — ENOXAPARIN SODIUM 40 MG/0.4ML ~~LOC~~ SOLN: 40.0000 mg | SUBCUTANEOUS | Status: DC

## 2020-07-07 MED ORDER — ENOXAPARIN SODIUM 60 MG/0.6ML ~~LOC~~ SOLN
0.5000 mg/kg | SUBCUTANEOUS | Status: DC
  Administered 2020-07-07 – 2020-07-08 (×2): 45 mg via SUBCUTANEOUS
  Filled 2020-07-07 (×2): qty 0.6

## 2020-07-07 MED ORDER — LACTATED RINGERS IV SOLN: INTRAVENOUS | Status: AC

## 2020-07-07 NOTE — Telephone Encounter (Signed)
On 4/13-I tried to reach the patient regarding the plan for direct admit to the hospital on 4/14; and plan for biliary stenting on 4/15- with IR; Dr.Watts.   Heather- please reach out to pt with plan/coordinate with Lauren. I spoke to Lauren yesterday.

## 2020-07-07 NOTE — Progress Notes (Signed)
Pt transported by kim dawkins, RN via w/c to [facility] to register for admission. Patient going to room 117.  Call report given Raquel Sarna, RN. Lauren, NP stated that she would call Raquel Sarna to give a full report.

## 2020-07-07 NOTE — Progress Notes (Signed)
Tumor Board Documentation  Tracy Bolton was presented by Dr Rogue Bussing at our Tumor Board on 07/07/2020, which included representatives from medical oncology,radiation oncology,internal medicine,navigation,pathology,radiology,surgical,pharmacy,genetics,research,palliative care,pulmonology.  Tracy Bolton currently presents as a new patient,for MDC,for new positive pathology with history of the following treatments: surgical intervention(s),active survellience.  Additionally, we reviewed previous medical and familial history, history of present illness, and recent lab results along with all available histopathologic and imaging studies. The tumor board considered available treatment options and made the following recommendations: Neoadjuvant chemotherapy,Surgery,Additional screening (PET/         Decompression)    The following procedures/referrals were also placed: No orders of the defined types were placed in this encounter.   Clinical Trial Status: not discussed   Staging used: AJCC Stage Group  AJCC Staging: T: 4 N: 1 M: 0 Group: Invasive Adenocarcinoma of Pancreas   National site-specific guidelines NCCN were discussed with respect to the case.  Tumor board is a meeting of clinicians from various specialty areas who evaluate and discuss patients for whom a multidisciplinary approach is being considered. Final determinations in the plan of care are those of the provider(s). The responsibility for follow up of recommendations given during tumor board is that of the provider.   Today's extended care, comprehensive team conference, Tracy Bolton was not present for the discussion and was not examined.   Multidisciplinary Tumor Board is a multidisciplinary case peer review process.  Decisions discussed in the Multidisciplinary Tumor Board reflect the opinions of the specialists present at the conference without having examined the patient.  Ultimately, treatment and diagnostic decisions rest with the  primary provider(s) and the patient.

## 2020-07-07 NOTE — Progress Notes (Signed)
PHARMACIST - PHYSICIAN COMMUNICATION  CONCERNING:  Enoxaparin (Lovenox) for DVT Prophylaxis    RECOMMENDATION: Patient was prescribed enoxaprin 40mg  q24 hours for VTE prophylaxis.   Filed Weights   07/07/20 1834  Weight: 90.9 kg (200 lb 6.4 oz)    Body mass index is 35.5 kg/m.  Estimated Creatinine Clearance: 42.7 mL/min (A) (by C-G formula based on SCr of 1.18 mg/dL (H)).   Based on [place] patient is candidate for enoxaparin 0.5mg /kg TBW SQ every 24 hours based on BMI being >30.  DESCRIPTION: Pharmacy has adjusted enoxaparin dose per [facility] policy.  Patient is now receiving enoxaparin 45 mg every 24 hours    Berta Minor, PharmD Clinical Pharmacist  07/07/2020 7:38 PM

## 2020-07-07 NOTE — Progress Notes (Signed)
Virtual Visit Progress Note  [facility]  Telephone:([phone] [phone]  I connected with [name] on 07/07/20 at  2:00 PM EDT by video enabled telemedicine visit and verified that I am speaking with the correct person using two identifiers.   I discussed the limitations, risks, security and privacy concerns of performing an evaluation and management service by telemedicine and the availability of in-person appointments. I also discussed with the patient that there may be a patient responsible charge related to this service. The patient expressed understanding and agreed to proceed.   Other persons participating in the visit and their role in the encounter: Renita Papa, RN   Patient's location: [facility] Provider's location: [facility]  Chief Complaint: biliary obstruction    Patient Care Team: Delsa Grana, PA-C as PCP - General (Family Medicine) Loney Loh, Michaell Cowing, MD as Referring Physician (Endocrinology) Olean Ree, MD as Consulting Physician (General Surgery) Clent Jacks, RN as Oncology Nurse Navigator   Name of the patient: Tracy [facility]  [id number]  [birth year]   Date of visit: 07/07/20  Diagnosis- Adenocarcinoma  Chief complaint/ Reason for visit- Biliary Obstruction  Heme/Onc history:  Oncology History Overview Note  # FEB 28th 2022-  Ill-defined hypodense area seen within the pancreatic head which appears to abut the duodenum and adjacent to coarse calcifications. 2. Duodenal diverticula with focal wall thickening/narrowing which; MRI Abdomen- 3.5 cm ill-defined hypovascular mass in the pancreatic head, highly suspicious for pancreatic adenocarcinoma. This causes mild diffuse biliary and pancreatic ductal dilatation. 1 cm peripancreatic lymph node, suspicious for metastatic disease.  # EUS/ERCP-attempted at [facility]]; Dr.Spaete-unable to traverse duodenal  stricture; duodenal biopsy positive adenocarcinoma.   could be due to mild duodenitis. 5. 8 mm pulmonary nodule within the right lung base. N # Goiter [Dr.Clemmons; [facility]: now with [facility]]    Primary pancreatic cancer ([facility])  07/05/2020 Initial Diagnosis   Primary pancreatic cancer Tracy [facility])     Interval history- Tracy [facility], 80 year old female [facility] for known biliary obstruction secondary to newly diagnosed pancreatic mass. Patient initially presented in late February 2022 for lower abdominal pain. Pancreatic mass seen on CT. Found to have elevated LFTs, obstructive jaundice. Upper GI endoscopy showed normal esophagus, stomach, malignant appearing stenosis in the proximal duodenum which could not be traversed therefore, ERCP could not be performed. Abnormal mucosa was biopsied which was consistent with invasive adenocarcinoma. She was seen by Dr. Rogue Bussing, medical oncology who recommended PET scan and referred to IR for biliary drain. She presents to clinic for re-evaluation and consideration of hospitalization for biliary drain placement.   Pathology: A.  Duodenum, ulcer, endoscopic biopsy: Invasive adenocarcinoma, moderately differentiated. Comment: The patient's history of pancreatic mass is noted. The finding may represent extension of pancreatic ductal adenocarcinoma into the duodenum with mucosal colonization by cancer cells.  Has acid reflux which is stable. No pain today. Eating and drinking ok. No nausea, vomiting, fevers, chills, constipation or diarrhea. No shortness of breath or chest pain. No abdominal pain.   Review of systems- Review of Systems  Constitutional: Negative for chills, fever, malaise/fatigue and weight loss.  HENT: Negative for hearing loss, nosebleeds, sore throat and tinnitus.   Eyes: Negative for blurred vision and double vision.  Respiratory: Negative for cough, hemoptysis, shortness of breath and wheezing.   Cardiovascular:  Negative for chest pain, palpitations and leg swelling.  Gastrointestinal: Negative for abdominal pain, blood in stool,  constipation, diarrhea, melena, nausea and vomiting.  Genitourinary: Negative for dysuria and urgency.  Musculoskeletal: Negative for back pain, falls, joint pain and myalgias.  Skin: Negative for itching and rash.  Neurological: Negative for dizziness, tingling, sensory change, loss of consciousness, weakness and headaches.  Endo/Heme/Allergies: Negative for environmental allergies. Does not bruise/bleed easily.  Psychiatric/Behavioral: Negative for depression. The patient is not nervous/anxious and does not have insomnia.     No Known Allergies  Past Medical History:  Diagnosis Date  . Arthritis   . Gout, chronic   . Hyperlipidemia   . Hypertension   . Osteoporosis   . Thyroid disease    Past Surgical History:  Procedure Laterality Date  . CATARACT EXTRACTION, BILATERAL  02/2018  . COLONOSCOPY  2016  . LUNG SURGERY Right 2004   biopsy normal   Social History   Socioeconomic History  . Marital status: Single    Spouse name: Not on file  . Number of children: 2  . Years of education: Not on file  . Highest education level: Some college, no degree  Occupational History  . Occupation: retired  Tobacco Use  . Smoking status: Never Smoker  . Smokeless tobacco: Never Used  Vaping Use  . Vaping Use: Never used  Substance and Sexual Activity  . Alcohol use: No    Alcohol/week: 0.0 standard drinks  . Drug use: No  . Sexual activity: Not Currently  Other Topics Concern  . Not on file  Social History Narrative   Pt lives alone. With [place]- med alert [closely -daughter, salibury]. Never smoked; no alcohol. Worked for [employer]; retd [0626]    Social Determinants of Radio broadcast assistant Strain: Low Risk   . Difficulty of Paying Living Expenses: Not hard at all  Food Insecurity: No Food Insecurity  . Worried About Charity fundraiser in the Last  Year: Never true  . Ran Out of Food in the Last Year: Never true  Transportation Needs: No Transportation Needs  . Lack of Transportation (Medical): No  . Lack of Transportation (Non-Medical): No  Physical Activity: Insufficiently Active  . Days of Exercise per Week: 7 days  . Minutes of Exercise per Session: 20 min  Stress: No Stress Concern Present  . Feeling of Stress : Not at all  Social Connections: Moderately Integrated  . Frequency of Communication with Friends and Family: More than three times a week  . Frequency of Social Gatherings with Friends and Family: More than three times a week  . Attends Religious Services: More than 4 times per year  . Active Member of Clubs or Organizations: Yes  . Attends Archivist Meetings: More than 4 times per year  . Marital Status: Never married  Intimate Partner Violence: Not At Risk  . Fear of Current or Ex-Partner: No  . Emotionally Abused: No  . Physically Abused: No  . Sexually Abused: No    Family History  Problem Relation Age of Onset  . Bone cancer Father 54       not biological father  . Thyroid disease Sister   . Cancer Mother 65       brain tumor  . Breast cancer Neg Hx     Current Outpatient Medications:  .  allopurinol (ZYLOPRIM) 100 MG tablet, TAKE 1 TABLET BY MOUTH EVERY DAY, Disp: 90 tablet, Rfl: 3 .  amLODipine (NORVASC) 10 MG tablet, TAKE 1 TABLET BY MOUTH EVERY DAY, Disp: 90 tablet, Rfl: 3 .  aspirin 81 MG tablet, Take 81 mg by mouth., Disp: , Rfl:  .  atenolol (TENORMIN) 50 MG tablet, TAKE 1 TABLET BY MOUTH EVERY DAY, Disp: 90 tablet, Rfl: 3 .  atorvastatin (LIPITOR) 10 MG tablet, TAKE 1 TABLET BY MOUTH EVERYDAY AT BEDTIME, Disp: 90 tablet, Rfl: 3 .  lidocaine-prilocaine (EMLA) cream, Apply 30 -45 mins prior to port access., Disp: 30 g, Rfl: 0 .  metFORMIN (GLUCOPHAGE-XR) 500 MG 24 hr tablet, TAKE 2 TABLETS BY MOUTH EVERY DAY, Disp: 180 tablet, Rfl: 3 .  Multiple Vitamin (MULTI-VITAMINS) TABS, Take 1  tablet by mouth daily., Disp: , Rfl:  .  nystatin (MYCOSTATIN/NYSTOP) powder, APPLY TO AFFECTED AREA OF SKIN 3X A DAY AS NEEDED IN A SUFFICIENT AMOUNT, Disp: 30 g, Rfl: 2 .  pantoprazole (PROTONIX) 40 MG tablet, Take 1 tablet (40 mg total) by mouth daily., Disp: 30 tablet, Rfl: 3 .  prochlorperazine (COMPAZINE) 10 MG tablet, Take 1 tablet (10 mg total) by mouth every 6 (six) hours as needed for nausea or vomiting., Disp: 40 tablet, Rfl: 1  Physical exam: Exam limited due to telemedicine Vitals:   07/07/20 1414  BP: 99/66  Pulse: 70  Resp: 18  Temp: 98 F (36.7 C)  SpO2: 98%  Weight: 200 lb 6.4 oz (90.9 kg)   Physical Exam Constitutional:      General: She is not in acute distress.    Appearance: Normal appearance.  HENT:     Head: Normocephalic.  Pulmonary:     Effort: No respiratory distress.  Neurological:     Mental Status: She is alert and oriented to person, place, and time.  Psychiatric:        Mood and Affect: Mood normal.        Behavior: Behavior normal.     CMP Latest Ref Rng & Units 07/05/2020  Glucose 70 - 99 mg/dL 129(H)  BUN 8 - 23 mg/dL 13  Creatinine 0.44 - 1.00 mg/dL 0.97  Sodium 135 - 145 mmol/L 140  Potassium 3.5 - 5.1 mmol/L 3.7  Chloride 98 - 111 mmol/L 102  CO2 22 - 32 mmol/L 23  Calcium 8.9 - 10.3 mg/dL 9.4  Total Protein 6.5 - 8.1 g/dL 8.0  Total Bilirubin 0.3 - 1.2 mg/dL 4.6(H)  Alkaline Phos 38 - 126 U/L 172(H)  AST 15 - 41 U/L 169(H)  ALT 0 - 44 U/L 100(H)   CBC Latest Ref Rng & Units 07/05/2020  WBC 4.0 - 10.5 K/uL 8.9  Hemoglobin 12.0 - 15.0 g/dL 12.7  Hematocrit 36.0 - 46.0 % 39.4  Platelets 150 - 400 K/uL 204   No images are attached to the encounter.  MR Abdomen W Wo Contrast  Result Date: 06/27/2020 CLINICAL DATA:  Abdominal pain.  Pancreatic lesion on recent CT. EXAM: MRI ABDOMEN WITHOUT AND WITH CONTRAST TECHNIQUE: Multiplanar multisequence MR imaging of the abdomen was performed both before and after the administration of  intravenous contrast. CONTRAST:  36mL GADAVIST GADOBUTROL 1 MMOL/ML IV SOLN COMPARISON:  CT on 05/23/2020 FINDINGS: Lower chest: No acute findings. Hepatobiliary: No hepatic masses identified. A few tiny sub-cm cysts are noted in the left lobe. Gallbladder is dilated and contains numerous tiny less than 1 cm gallstones. No evidence of acute cholecystitis. Diffuse biliary ductal dilatation is seen, with common bile duct measuring 10 mm. Stricture of the distal common bile duct is seen at the level the pancreatic head. Pancreas: Mild pancreatic ductal dilatation is seen to the level of the pancreatic head.  A poorly defined hypovascular mass is seen in the pancreatic head which measures 3.5 x 3.2 cm on image 57/22. This shows no evidence of vascular encasement. No evidence peripancreatic inflammatory changes or fluid collections. Spleen:  Within normal limits in size and appearance. Adrenals/Urinary Tract: No masses identified. A few tiny sub-cm left renal cysts are noted. No evidence of hydronephrosis. Stomach/Bowel: Visualized portion unremarkable. Vascular/Lymphatic: 1 cm peripancreatic lymph node seen along the anterior and superior margin of the pancreatic head, suspicious for metastatic disease. No other pathologically enlarged lymph nodes identified. No abdominal aortic aneurysm. Other:  None. Musculoskeletal:  No suspicious bone lesions identified. IMPRESSION: 3.5 cm ill-defined hypovascular mass in the pancreatic head, highly suspicious for pancreatic adenocarcinoma. This causes mild diffuse biliary and pancreatic ductal dilatation. 1 cm peripancreatic lymph node, suspicious for metastatic disease. Distended gallbladder with numerous small gallstones. No radiographic evidence of acute cholecystitis. These results will be called to the ordering clinician or representative by the Radiologist Assistant, and communication documented in the PACS or [employer]. Electronically Signed   By: Marlaine Hind M.D.    On: 06/27/2020 15:27    Assessment and plan- Patient is a 80 y.o. female with newly diagnosed pancreatic mass who [facility] for biliary obstruction seen on imaging while working up pancreatic mass.   1. Biliary Obstruction - secondary to pancreatic mass. Mild. Measures 3.5. Plan for biliary drain placement with IR tomorrow. Dr. Rogue Bussing discussed with IR. Orders are in. ptt, pt pending at time of dictation. No leukocytosis. Hemodynamically stable. Will need to be NPO at midnight. Confirmed that she does not take anticoagulation. Plan to hold aspirin 81 mg- confirmed with Dr. Pascal Lux.   2. Pancreatic Mass- Pathology of duodenal ulcer positive for invasive adenocarcinoma. Awaiting PET scan. Dr. Rogue Bussing to consult with [facility] for possible upfront surgery given obstruction. If not, would plan for neoadjuvant chemotherapy.   3. Elevated LFTs- consistent with biliary obstruction and pancreatic mass. Bilirubin 5.6- rising. Monitor post drain placement.   Plan to admit to [facility] today for decompression. Discussed with Dr. Heloise Beecham who accepted for admission.    Visit Diagnosis 1. Biliary obstruction   2. Pancreatic mass    Patient expressed understanding and was in agreement with this plan. She also understands that She can call clinic at any time with any questions, concerns, or complaints.   I discussed the assessment and treatment plan with the patient. The patient was provided an opportunity to ask questions and all were answered. The patient agreed with the plan and demonstrated an understanding of the instructions.   The patient was advised to call back or seek an in-person evaluation if the symptoms worsen or if the condition fails to improve as anticipated.   I spent 60 minutes face-to-face video visit time dedicated to the care of this patient on the date of this encounter to include pre-visit review of abdominal imaging, labs, medical oncology notes, face-to-face  time with the patient, and post visit ordering of testing/documentation and discussion of patient with admitting hospitalist.   Thank you for allowing me to participate in the care of this very pleasant patient.   Beckey Rutter, DNP, AGNP-C [facility]

## 2020-07-07 NOTE — H&P (Signed)
History and Physical  Tracy [facility] GNF:[id number] DOB: 1940-10-17 DOA: 07/07/2020  Referring physician: Beckey Rutter NP, oncology PCP: Delsa Grana, PA-C  Outpatient Specialists: Oncology, GI. Patient coming from: Cancer center as a direct admit.  Chief Complaint: Biliary obstruction secondary to newly diagnosed pancreatic mass.  HPI: Tracy [facility] is a 80 y.o. female with medical history significant for newly diagnosed pancreatic mass, obstructive jaundice.  Patient underwent evaluation at [facility], unfortunately given the duodenal stricture from the pancreatic mass patient did not get EUS or ERCP.  Patient is referred to IR for biliary drain.  Patient was admitted as direct admit from the cancer center for possible biliary drain placement by interventional radiology.  She denies any recent cardiopulmonary or GI symptoms.  Denies fevers or chills.  TR[facility] was asked to admit as a direct admit from the cancer center.   ED Course:  Afebrile.  BP 99/66, heart rate 70, respiratory 18, O2 saturation 98% on room air. Lab studies remarkable for creatinine 1.18 with GFR 48, serum glucose 122, serum bicarb 20 supplementals gap 16, alkaline phosphatase 179, AST 22, ALT 113, bilirubin 5.6.  Latest CA 19-9 71 on 07/05/20.   Review of Systems: Review of systems as noted in the HPI. All other systems reviewed and are negative.   Past Medical History:  Diagnosis Date  . Arthritis   . Gout, chronic   . Hyperlipidemia   . Hypertension   . Osteoporosis   . Thyroid disease    Past Surgical History:  Procedure Laterality Date  . CATARACT EXTRACTION, BILATERAL  02/2018  . COLONOSCOPY  2016  . LUNG SURGERY Right 2004   biopsy normal    Social History:  reports that she has never smoked. She has never used smokeless tobacco. She reports that she does not drink alcohol and does not use drugs.   No Known Allergies  Family History  Problem Relation Age of Onset  . Bone cancer Father 33       not  biological father  . Thyroid disease Sister   . Cancer Mother 4       brain tumor  . Breast cancer Neg Hx       Prior to Admission medications   Medication Sig Start Date End Date Taking? Authorizing Provider  allopurinol (ZYLOPRIM) 100 MG tablet TAKE 1 TABLET BY MOUTH EVERY DAY 09/29/19   Delsa Grana, PA-C  amLODipine (NORVASC) 10 MG tablet TAKE 1 TABLET BY MOUTH EVERY DAY 09/29/19   Delsa Grana, PA-C  aspirin 81 MG tablet Take 81 mg by mouth. 03/13/06   [provider]  atenolol (TENORMIN) 50 MG tablet TAKE 1 TABLET BY MOUTH EVERY DAY 09/29/19   Delsa Grana, PA-C  atorvastatin (LIPITOR) 10 MG tablet TAKE 1 TABLET BY MOUTH EVERYDAY AT BEDTIME 08/24/19   Delsa Grana, PA-C  lidocaine-prilocaine (EMLA) cream Apply 30 -45 mins prior to port access. 07/05/20   Cammie Sickle, MD  metFORMIN (GLUCOPHAGE-XR) 500 MG 24 hr tablet TAKE 2 TABLETS BY MOUTH EVERY DAY 09/29/19   Delsa Grana, PA-C  Multiple Vitamin (MULTI-VITAMINS) TABS Take 1 tablet by mouth daily. 11/14/07   [provider]  nystatin (MYCOSTATIN/NYSTOP) powder APPLY TO AFFECTED AREA OF SKIN 3X A DAY AS NEEDED IN A SUFFICIENT AMOUNT 01/27/20   Delsa Grana, PA-C  pantoprazole (PROTONIX) 40 MG tablet Take 1 tablet (40 mg total) by mouth daily. 07/05/20   Cammie Sickle, MD  prochlorperazine (COMPAZINE) 10 MG tablet Take 1 tablet (10  mg total) by mouth every 6 (six) hours as needed for nausea or vomiting. Patient not taking: Reported on 07/07/2020 07/05/20   Cammie Sickle, MD    Physical Exam: Ht 5\' 3"  (1.6 m)   Wt 90.9 kg   BMI 35.50 kg/m   . General: 80 y.o. year-old female well developed well nourished in no acute distress.  Alert and oriented x3. . Cardiovascular: Regular rate and rhythm with no rubs or gallops.  No thyromegaly or JVD noted.  No lower extremity edema. 2/4 pulses in all 4 extremities. Marland Kitchen Respiratory: Clear to auscultation with no wheezes or rales. Good inspiratory effort. . Abdomen:  Soft nontender nondistended with normal bowel sounds x4 quadrants. . Muskuloskeletal: No cyanosis, clubbing or edema noted bilaterally . Neuro: CN II-XII intact, strength, sensation, reflexes . Skin: No ulcerative lesions noted or rashes.  Mildly jaundiced. Marland Kitchen Psychiatry: Judgement and insight appear normal. Mood is appropriate for condition and setting          Labs on Admission:  Basic Metabolic Panel: Recent Labs  Lab 07/05/20 1033 07/07/20 1333  NA 140 139  K 3.7 3.6  CL 102 103  CO2 23 20*  GLUCOSE 129* 172*  BUN 13 13  CREATININE 0.97 1.18*  CALCIUM 9.4 9.4   Liver Function Tests: Recent Labs  Lab 07/05/20 1033 07/07/20 1333  AST 169* 218*  ALT 100* 113*  ALKPHOS 172* 179*  BILITOT 4.6* 5.6*  PROT 8.0 7.8  ALBUMIN 3.9 3.7   No results for input(s): LIPASE, AMYLASE in the last 168 hours. No results for input(s): AMMONIA in the last 168 hours. CBC: Recent Labs  Lab 07/05/20 1033 07/07/20 1333  WBC 8.9 9.1  NEUTROABS 5.6 6.3  HGB 12.7 12.1  HCT 39.4 37.4  MCV 84.0 83.7  PLT 204 196   Cardiac Enzymes: No results for input(s): CKTOTAL, CKMB, CKMBINDEX, TROPONINI in the last 168 hours.  BNP (last 3 results) No results for input(s): BNP in the last 8760 hours.  ProBNP (last 3 results) No results for input(s): PROBNP in the last 8760 hours.  CBG: No results for input(s): GLUCAP in the last 168 hours.  Radiological Exams on Admission: No results found.  EKG: I independently viewed the EKG done and my findings are as followed: Not available at the time of this visit.  Assessment/Plan Present on Admission: . Biliary obstruction  Active Problems:   Biliary obstruction  Biliary obstruction secondary to newly diagnosed pancreatic mass Patient underwent evaluation at [facility] unfortunately given the duodenal stricture from the pancreatic mass patient did not get EUS or ERCP.  Patient is referred to IR for biliary drain placement.  IR consulted Total  bilirubin is up-trending Repeat CMP in the morning N.p.o. after midnight, hold home aspirin, due to possible procedure in the morning  Elevated liver chemistries/obstructive jaundice Liver chemistries are uptrending Total bilirubin is also uptrending Repeat CMP in the morning IR for possible biliary drain placement Hold aspirin due to possible procedure in the morning N.p.o. after midnight. No evidence of coagulopathy, INR 1.2.  Anion gap metabolic acidosis  Presented with serum bicarb of 20 and anion gap of 16 Continue gentle IV fluid hydration Repeat renal panel in the morning.  Newly diagnosed pancreatic mass Management per oncology.  Type 2 diabetes with hyperglycemia Obtain hemoglobin A1c Start insulin sliding scale Avoid hypoglycemia.  Hyperlipidemia Hold off home Lipitor due to elevated liver chemistries.  Essential hypertension BP soft Hold off home oral antihypertensive Maintain MAP greater  than 65. Closely monitor vital signs  GERD Resume home PPI  Gout Resume home regimen.    DVT prophylaxis: Subcu Lovenox daily, no evidence of bleeding or coagulopathy.  Code Status: Full code as stated by the patient herself.  Family Communication: None at bedside.  Disposition Plan: Admit to MedSurg unit with remote telemetry.  Consults called: IR.  Admission status: Observation status.   Status is: Observation   Dispo: The patient is from: Home.              Anticipated d/c is to: Home.              Patient currently, but stable for discharge due to ongoing management of biliary obstruction.   Difficult to place patient, not applicable.       Kayleen Memos MD [facility] Pager [phone]  If 7PM-7AM, please contact night-coverage [email or web address] Password [facility]  07/07/2020, 7:16 PM

## 2020-07-07 NOTE — Telephone Encounter (Signed)
I spoke with the patient last evening. We will plan to have her come to the cancer center today at 1:30 pm for labs. Lauren stated that she will call the hospitalist regarding the admission.

## 2020-07-08 ENCOUNTER — Ambulatory Visit
Admission: RE | Admit: 2020-07-08 | Discharge: 2020-07-08 | Disposition: A | Discharge status: Home / Self Care | Payer: Medicare PPO | Source: Ambulatory Visit | Attending: Internal Medicine | Admitting: Internal Medicine

## 2020-07-08 ENCOUNTER — Observation Stay: Payer: Medicare PPO

## 2020-07-08 DIAGNOSIS — K831 Obstruction of bile duct: Secondary | ICD-10-CM

## 2020-07-08 DIAGNOSIS — C259 Malignant neoplasm of pancreas, unspecified: Secondary | ICD-10-CM

## 2020-07-08 HISTORY — PX: IR BILIARY DRAIN PLACEMENT WITH CHOLANGIOGRAM: IMG6043

## 2020-07-08 LAB — CBC WITH DIFFERENTIAL/PLATELET
Abs Immature Granulocytes: 0.02 10*3/uL (ref 0.00–0.07)
Basophils Absolute: 0 10*3/uL (ref 0.0–0.1)
Basophils Relative: 0 %
Eosinophils Absolute: 0.3 10*3/uL (ref 0.0–0.5)
Eosinophils Relative: 4 %
HCT: 34.2 % — ABNORMAL LOW (ref 36.0–46.0)
Hemoglobin: 11.1 g/dL — ABNORMAL LOW (ref 12.0–15.0)
Immature Granulocytes: 0 %
Lymphocytes Relative: 27 %
Lymphs Abs: 2.2 10*3/uL (ref 0.7–4.0)
MCH: 26.8 pg (ref 26.0–34.0)
MCHC: 32.5 g/dL (ref 30.0–36.0)
MCV: 82.6 fL (ref 80.0–100.0)
Monocytes Absolute: 0.8 10*3/uL (ref 0.1–1.0)
Monocytes Relative: 11 %
Neutro Abs: 4.6 10*3/uL (ref 1.7–7.7)
Neutrophils Relative %: 58 %
Platelets: 165 10*3/uL (ref 150–400)
RBC: 4.14 MIL/uL (ref 3.87–5.11)
RDW: 15.8 % — ABNORMAL HIGH (ref 11.5–15.5)
WBC: 7.9 10*3/uL (ref 4.0–10.5)
nRBC: 0 % (ref 0.0–0.2)

## 2020-07-08 LAB — GLUCOSE, CAPILLARY
Glucose-Capillary: 93 mg/dL (ref 70–99)
Glucose-Capillary: 175 mg/dL — ABNORMAL HIGH (ref 70–99)
Glucose-Capillary: 139 mg/dL — ABNORMAL HIGH (ref 70–99)

## 2020-07-08 LAB — COMPREHENSIVE METABOLIC PANEL
ALT: 104 U/L — ABNORMAL HIGH (ref 0–44)
AST: 192 U/L — ABNORMAL HIGH (ref 15–41)
Albumin: 3.2 g/dL — ABNORMAL LOW (ref 3.5–5.0)
Alkaline Phosphatase: 167 U/L — ABNORMAL HIGH (ref 38–126)
Anion gap: 13 (ref 5–15)
BUN: 13 mg/dL (ref 8–23)
CO2: 24 mmol/L (ref 22–32)
Calcium: 9.1 mg/dL (ref 8.9–10.3)
Chloride: 104 mmol/L (ref 98–111)
Creatinine, Ser: 1.04 mg/dL — ABNORMAL HIGH (ref 0.44–1.00)
GFR, Estimated: 55 mL/min — ABNORMAL LOW (ref >60)
Glucose, Bld: 99 mg/dL (ref 70–99)
Potassium: 3.9 mmol/L (ref 3.5–5.1)
Sodium: 141 mmol/L (ref 135–145)
Total Bilirubin: 5.6 mg/dL — ABNORMAL HIGH (ref 0.3–1.2)
Total Protein: 6.8 g/dL (ref 6.5–8.1)

## 2020-07-08 LAB — PROTIME-INR
INR: 1.2 (ref 0.8–1.2)
Prothrombin Time: 15.2 seconds (ref 11.4–15.2)

## 2020-07-08 LAB — MAGNESIUM: Magnesium: 1.5 mg/dL — ABNORMAL LOW (ref 1.7–2.4)

## 2020-07-08 LAB — SARS CORONAVIRUS 2 (TAT 6-24 HRS): SARS Coronavirus 2: NEGATIVE

## 2020-07-08 LAB — HEMOGLOBIN A1C
Hgb A1c MFr Bld: 5.2 % (ref 4.8–5.6)
Mean Plasma Glucose: 102.54 mg/dL

## 2020-07-08 LAB — PHOSPHORUS: Phosphorus: 2.8 mg/dL (ref 2.5–4.6)

## 2020-07-08 LAB — CANCER ANTIGEN 19-9: CA 19-9: 69 U/mL — ABNORMAL HIGH (ref 0–35)

## 2020-07-08 MED ORDER — IODIXANOL 320 MG/ML IV SOLN
50.0000 mL | Freq: Once | INTRAVENOUS | Status: AC
  Administered 2020-07-08: 10 mL

## 2020-07-08 MED ORDER — METFORMIN HCL ER 500 MG PO TB24
1000.0000 mg | ORAL_TABLET | Freq: Every day | ORAL | Status: DC
  Administered 2020-07-09: 10:00:00 1000 mg via ORAL
  Filled 2020-07-08: qty 2

## 2020-07-08 MED ORDER — MIDAZOLAM HCL 2 MG/2ML IJ SOLN
INTRAMUSCULAR | Status: AC
  Filled 2020-07-08: qty 2

## 2020-07-08 MED ORDER — ATENOLOL 50 MG PO TABS
50.0000 mg | ORAL_TABLET | Freq: Every day | ORAL | Status: DC
  Administered 2020-07-09: 50 mg via ORAL
  Filled 2020-07-08 (×2): qty 1

## 2020-07-08 MED ORDER — MAGNESIUM SULFATE 50 % IJ SOLN
3.0000 g | Freq: Once | INTRAVENOUS | Status: AC
  Administered 2020-07-08: 14:00:00 3 g via INTRAVENOUS
  Filled 2020-07-08: qty 6

## 2020-07-08 MED ORDER — SODIUM CHLORIDE 0.9% FLUSH: 5.0000 mL | Freq: Three times a day (TID) | INTRAVENOUS | Status: DC

## 2020-07-08 MED ORDER — AMLODIPINE BESYLATE 10 MG PO TABS
10.0000 mg | ORAL_TABLET | Freq: Every day | ORAL | Status: DC
  Administered 2020-07-09: 10:00:00 10 mg via ORAL
  Filled 2020-07-08: qty 1

## 2020-07-08 MED ORDER — SODIUM CHLORIDE 0.9 % IV SOLN
2.0000 g | INTRAVENOUS | Status: AC
  Filled 2020-07-08: qty 2

## 2020-07-08 MED ORDER — FENTANYL CITRATE (PF) 100 MCG/2ML IJ SOLN
INTRAMUSCULAR | Status: AC | PRN
  Administered 2020-07-08 (×3): 50 ug via INTRAVENOUS

## 2020-07-08 MED ORDER — FENTANYL CITRATE (PF) 100 MCG/2ML IJ SOLN
INTRAMUSCULAR | Status: AC
  Filled 2020-07-08: qty 2

## 2020-07-08 MED ORDER — MIDAZOLAM HCL 2 MG/2ML IJ SOLN
INTRAMUSCULAR | Status: AC | PRN
  Administered 2020-07-08 (×2): 1 mg via INTRAVENOUS

## 2020-07-08 MED ORDER — SODIUM CHLORIDE 0.9 % IV SOLN: INTRAVENOUS | Status: DC

## 2020-07-08 MED ORDER — SODIUM CHLORIDE 0.9 % IV SOLN
INTRAVENOUS | Status: AC | PRN
  Administered 2020-07-08: 2 g via INTRAVENOUS

## 2020-07-08 NOTE — TOC Initial Note (Signed)
Transition of Care [facility]) - Initial/Assessment Note    Patient Details  Name: Tracy Bolton MRN: [id number] Date of Birth: February 28, 1941  Transition of Care [facility]) CM/SW Contact:    Shelbie Hutching, RN Phone Number: 07/08/2020, 2:58 PM  Clinical Narrative:                 Patient placed under observation for biliary drain placement for biliary obstruction.  RNCM met with patient at the bedside and reviewed MOON.  Patient is from home where she lives alone.  Patient is independent in ADL's and has a walker.  Patient drives.  Patient's daughter can pick her up from the hospital at discharge.  Patient agrees with home health nursing.   [place] with [facility] accepted referral and nursing should be able to go out and see patient on Monday if she discharges over the weekend.    Expected Discharge Plan: [place] Barriers to Discharge: Continued Medical Work up   Patient Goals and CMS Choice Patient states their goals for this hospitalization and ongoing recovery are:: would like to go home and would like home health services CMS Medicare.gov Compare Post Acute Care list provided to:: Patient Choice offered to / list presented to : Patient  Expected Discharge Plan and Services Expected Discharge Plan: [place]   Discharge Planning Services: CM Consult Post Acute Care Choice: [place] arrangements for the past 2 months: Single Family Home                 DME Arranged: N/A DME Agency: NA       HH Arranged: RN [place] Agency: [facility] Date [place]: 07/08/20 Time [place]: 1457    Prior Living Arrangements/Services Living arrangements for the past 2 months: Single Family Home Lives with:: Self Patient language and need for interpreter reviewed:: Yes Do you feel safe going back to the place where you live?: Yes      Need for Family Participation in Patient Care: Yes (Comment) (biliary obstruction) Care giver  support system in place?: Yes (comment) (daughter) Current home services: DME (walker) Criminal Activity/Legal Involvement Pertinent to Current Situation/Hospitalization: No - Comment as needed  Activities of Daily Living Home Assistive Devices/Equipment: Walker (specify type) ADL Screening (condition at time of admission) Patient's cognitive ability adequate to safely complete daily activities?: Yes Is the patient deaf or have difficulty hearing?: No Does the patient have difficulty seeing, even when wearing glasses/contacts?: No Does the patient have difficulty concentrating, remembering, or making decisions?: No Patient able to express need for assistance with ADLs?: Yes Does the patient have difficulty dressing or bathing?: No Independently performs ADLs?: Yes (appropriate for developmental age) Does the patient have difficulty walking or climbing stairs?: No Weakness of Legs: None Weakness of Arms/Hands: None  Permission Sought/Granted Permission sought to share information with : Case Manager,Other (comment) Permission granted to share information with : Yes, Verbal Permission Granted     Permission granted to share info w AGENCY: Home health agencies        Emotional Assessment Appearance:: Appears stated age Attitude/Demeanor/Rapport: Engaged Affect (typically observed): Accepting Orientation: : Oriented to Self,Oriented to Place,Oriented to  Time,Oriented to Situation Alcohol / Substance Use: Not Applicable Psych Involvement: No (comment)  Admission diagnosis:  Biliary obstruction [K83.1] Patient Active Problem List   Diagnosis Date Noted  . Biliary obstruction 07/07/2020  . Primary pancreatic cancer ([place]) 07/05/2020  . Osteopenia 07/02/2019  . Limited  mobility 03/03/2019  . Abnormality of gait and mobility 03/03/2019  . Risk for falls 03/03/2019  . Urine test positive for microalbuminuria 06/06/2017  . Full code status 05/21/2017  . Preventative health care  05/21/2017  . Elevated serum glutamic pyruvic transaminase (SGPT) level 05/19/2016  . Callus of foot 05/18/2016  . Urine abnormality 11/14/2015  . Encounter for medication monitoring 11/14/2015  . Hyperlipidemia 11/14/2015  . Pre-diabetes 05/18/2015  . OP (osteoporosis) 09/06/2014  . Chronic gouty arthritis 09/06/2014  . Obesity, Class III, BMI 40-49.9 (morbid obesity) ([place]) 09/06/2014  . 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 Hypertension goal BP (blood pressure) < 140/90 07/12/2014  . Weakness of both lower extremities 07/12/2014  . Dry skin 07/12/2014  . Right-sided uninodular goiter 07/12/2014  . Cyst of eye 07/12/2014  . Constipation, chronic 07/12/2014  . Subclinical hypothyroidism 02/06/2013   PCP:  Delsa Grana, PA-C Pharmacy:   [id number] [place] [id number]: [phone]: [phone]    Social Determinants of Health (SDOH) Interventions    Readmission Risk Interventions No flowsheet data found.

## 2020-07-08 NOTE — Hospital Course (Signed)
80 year old female community dwelling Prior history of cholelithiasis periumbilical pain umbilical hernia followed by Dr. 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 Cephas Darby 07/01/2020 unable to traverse the stricture-duodenal biopsy positive for adenocarcinoma Was in the process of being referred to [place] surgery Dr. Manuella Ghazi who might consider upfront resectable surgery to avoid duodenal obstruction  Patient was directly admitted to [facility] regional 4/14 for possible biliary drain placement Labs = BUNs/creatinine 1.18 bicarb 20 bilirubin 5.6 CA 19-9 on 4/12 was 71  Kept n.p.o. after midnight for IR drain placement   Data BUNs/creatinine 13/1.1-->13/1.0 Magnesium 1.5 AST/ALT 218/113-->192/104 WBC 7 White count 11

## 2020-07-08 NOTE — Progress Notes (Signed)
PROGRESS NOTE   Tracy Bolton  YIR:[id number] DOB: 10-14-1940 DOA: 07/07/2020 PCP: Delsa Grana, PA-C  Brief Narrative:   80 year old female community dwelling Prior history of cholelithiasis periumbilical pain umbilical hernia followed by Dr. 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 Cephas Darby 07/01/2020 unable to traverse the stricture-duodenal biopsy positive for adenocarcinoma--patient was being set up for IR guided drainage at [facility] and was in the process of being referred to [facility] surgery Dr. Manuella Ghazi who might consider upfront resectable surgery to avoid duodenal obstruction  Patient was directly admitted to [facility] regional 4/14 for internal/external biliary drain placement Labs = BUNs/creatinine 1.18 bicarb 20 bilirubin 5.6 CA 19-9 on 4/12 was 71  Hospital-Problem based course pancreatic head adeno CA status post IR drain guided 10 [place] internal/external biliary drain placement 4/15 Greatly appreciate IR assistance Dr. Grayland Ormond added to treatment team Check a.m. lipase LFTs to rule out post procedure issues such as pancreatitis Family/patient will learn drain management Home health engaged HTN Resume amlodipine 10, atenolol 50 DM TY 2 Continue sliding scale coverage Resume Metformin 1000 XR every morning    DVT prophylaxis: SCDs Code Status: Full code Family Communication: Discussed with daughter at the bedside Disposition:  Status is: Observation  The patient remains OBS appropriate and will d/c before 2 midnights.  Dispo:  Patient From: Home  Planned Disposition: Home with Health Care Svc  Medically stable for discharge: No      Consultants:   Interventional radiology  Oncology  Procedures:  As above  Antimicrobials: Perioperative   Subjective: Back from procedure earlier this afternoon Seems to be doing  fair although had some back pain No chest pain no fever no chills no nausea  Objective: Vitals:   07/07/20 2002 07/07/20 2329 07/08/20 0355 07/08/20 0731  BP: 120/73 103/63 111/65 (!) 101/59  Pulse: 66 71 69 71  Resp: 16 16 16 14   Temp: 98.7 F (37.1 C) (!) 97.5 F (36.4 C) 98.2 F (36.8 C) 98.6 F (37 C)  TempSrc: Oral Oral Oral   SpO2: 100% 96% 95% 99%  Weight:      Height:        Intake/Output Summary (Last 24 hours) at 07/08/2020 1217 Last data filed at 07/08/2020 0347 Gross per 24 hour  Intake 468.78 ml  Output --  Net 468.78 ml   Filed Weights   07/07/20 1834  Weight: 90.9 kg    Examination:  Awake coherent no distress EOMI NCAT no focal deficit Chest clear no added sound Abdomen very obese nontender softlarge 4 x 4 bandage in place over epigastrium with drain noted S1-S2 no murmur sinus No lower extremity edema   Data Reviewed: personally reviewed    Data BUNs/creatinine 13/1.1-->13/1.0 Magnesium 1.5 AST/ALT 218/113-->192/104 WBC 7 White count 11  Radiology Studies: No results found.   Scheduled Meds: . allopurinol  100 mg Oral Daily  . amLODipine  10 mg Oral Daily  . atenolol  50 mg Oral Daily  . enoxaparin (LOVENOX) injection  0.5 mg/kg Subcutaneous Q24H  . insulin aspart  0-6 Units Subcutaneous TID WC  . metFORMIN  1,000 mg Oral Daily  . pantoprazole  40 mg Oral Daily   Continuous Infusions: . cefOXitin    . lactated ringers 50 mL/hr at 07/07/20 2039  . magnesium sulfate bolus IVPB Stopped (07/08/20 1037)     LOS: 1 day   Time spent:  [place], MD [facility] To contact the attending provider between 7A-7P or the covering provider during after hours 7P-7A, please log into the web site [email or web address] and access using universal  password for that web site. If you do not have the password, please call the hospital operator.  07/08/2020, 12:17 PM

## 2020-07-08 NOTE — Progress Notes (Signed)
  Pt leaving floor at this time for ordered procedure

## 2020-07-08 NOTE — Consult Note (Signed)
Chief Complaint: Patient was seen in consultation today for biliary obstruction secondary to pancreatic mass  Referring Physician(s): Dr. Irene Pap   Supervising Physician: Daryll Brod  Patient Status: [name] - In-pt  History of Present Illness: Tracy Bolton is a 80 y.o. female with a medical history significant for HTN, gout and thyroid disease. She was taken to the hospital via EMS on 05/22/20 for abdominal pain. Lab work up was unremarkable with the exception of mild leukocytosis and CT imaging showed an ill-defined hypodense area within the pancreatic head (suspicious for possible neoplasm), diverticulosis and cholelithiasis. She was discharged with outpatient follow up. Additional imaging and lab work were ordered by Oncology.   MR Abdomen 06/27/20 IMPRESSION: 1. 3.5 cm ill-defined hypovascular mass in the pancreatic head, highly suspicious for pancreatic adenocarcinoma. This causes mild diffuse biliary and pancreatic ductal dilatation. 2. 1 cm peripancreatic lymph node, suspicious for metastatic disease. 3. Distended gallbladder with numerous small gallstones. No radiographic evidence of acute cholecystitis.  Lab work trends showed worsening bilirubin and the patient was sent to the hospital as a direct admit. Interventional Radiology has been asked to evaluate this patient for an image-guided percutaneous biliary drain. This case has been reviewed and procedure approved by Dr. Pascal Lux.   Of note, the patient was seen at [facility] 07/01/20 and an upper GI was performed with biopsy of a duodenal ulcer returning positive for invasive adenocarcinoma. 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 Arthritis   . Gout, chronic   . Hyperlipidemia   . Hypertension   . Osteoporosis   . Thyroid disease     Past Surgical History:   Procedure Laterality Date  . CATARACT EXTRACTION, BILATERAL  02/2018  . COLONOSCOPY  2016  . LUNG SURGERY Right 2004   biopsy normal    Allergies: Patient has no known allergies.  Medications: Prior to Admission medications   Medication Sig Start Date End Date Taking? Authorizing Provider  allopurinol (ZYLOPRIM) 100 MG tablet TAKE 1 TABLET BY MOUTH EVERY DAY 09/29/19   Delsa Grana, PA-C  amLODipine (NORVASC) 10 MG tablet TAKE 1 TABLET BY MOUTH EVERY DAY 09/29/19   Delsa Grana, PA-C  aspirin 81 MG tablet Take 81 mg by mouth. 03/13/06   [provider]  atenolol (TENORMIN) 50 MG tablet TAKE 1 TABLET BY MOUTH EVERY DAY 09/29/19   Delsa Grana, PA-C  atorvastatin (LIPITOR) 10 MG tablet TAKE 1 TABLET BY MOUTH EVERYDAY AT BEDTIME 08/24/19   Delsa Grana, PA-C  lidocaine-prilocaine (EMLA) cream Apply 30 -45 mins prior to port access. 07/05/20   Cammie Sickle, MD  metFORMIN (GLUCOPHAGE-XR) 500 MG 24 hr tablet TAKE 2 TABLETS BY MOUTH EVERY DAY 09/29/19   Delsa Grana, PA-C  Multiple Vitamin (MULTI-VITAMINS) TABS Take 1 tablet by mouth daily. 11/14/07   [provider]  nystatin (MYCOSTATIN/NYSTOP) powder APPLY TO AFFECTED AREA OF SKIN 3X A DAY AS NEEDED IN A SUFFICIENT AMOUNT 01/27/20   Delsa Grana, PA-C  pantoprazole (PROTONIX) 40 MG tablet Take 1 tablet (40 mg total) by mouth daily. 07/05/20   Cammie Sickle, MD  prochlorperazine (COMPAZINE) 10 MG tablet Take 1 tablet (10 mg total) by mouth every 6 (six) hours as needed for nausea or vomiting. Patient not taking: Reported on 07/07/2020 07/05/20   Cammie Sickle, MD     Family History  Problem Relation  Age of Onset  . Bone cancer Father 32       not biological father  . Thyroid disease Sister   . Cancer Mother 75       brain tumor  . Breast cancer Neg Hx     Social History   Socioeconomic History  . Marital status: Single    Spouse name: Not on file  . Number of children: 2  . Years of education: Not  on file  . Highest education level: Some college, no degree  Occupational History  . Occupation: retired  Tobacco Use  . Smoking status: Never Smoker  . Smokeless tobacco: Never Used  Vaping Use  . Vaping Use: Never used  Substance and Sexual Activity  . Alcohol use: No    Alcohol/week: 0.0 standard drinks  . Drug use: No  . Sexual activity: Not Currently  Other Topics Concern  . Not on file  Social History Narrative   Pt lives alone. With [place]- med alert [closely -daughter, salibury]. Never smoked; no alcohol. Worked for [employer]; retd [1191]    Social Determinants of Radio broadcast assistant Strain: Low Risk   . Difficulty of Paying Living Expenses: Not hard at all  Food Insecurity: No Food Insecurity  . Worried About Charity fundraiser in the Last Year: Never true  . Ran Out of Food in the Last Year: Never true  Transportation Needs: No Transportation Needs  . Lack of Transportation (Medical): No  . Lack of Transportation (Non-Medical): No  Physical Activity: Insufficiently Active  . Days of Exercise per Week: 7 days  . Minutes of Exercise per Session: 20 min  Stress: No Stress Concern Present  . Feeling of Stress : Not at all  Social Connections: Moderately Integrated  . Frequency of Communication with Friends and Family: More than three times a week  . Frequency of Social Gatherings with Friends and Family: More than three times a week  . Attends Religious Services: More than 4 times per year  . Active Member of Clubs or Organizations: Yes  . Attends Archivist Meetings: More than 4 times per year  . Marital Status: Never married    Review of Systems: A 12 point ROS discussed and pertinent positives are indicated in the HPI above.  All other systems are negative.  Review of Systems  Constitutional: Negative for appetite change and fatigue.  Respiratory: Negative for cough and shortness of breath.   Cardiovascular: Negative for chest pain and leg  swelling.  Gastrointestinal: Negative for abdominal pain, diarrhea, nausea and vomiting.  Genitourinary: Negative for flank pain.  Musculoskeletal: Negative for back pain.  Skin: Negative for color change.  Neurological: Negative for headaches.    Vital Signs: BP (!) 101/59 (BP Location: Right Arm)   Pulse 71   Temp 98.6 F (37 C)   Resp 14   Ht 5\' 3"  (1.6 m)   Wt 200 lb 6.4 oz (90.9 kg)   SpO2 99%   BMI 35.50 kg/m   Physical Exam Constitutional:      General: She is not in acute distress. HENT:     Mouth/Throat:     Mouth: Mucous membranes are moist.     Pharynx: Oropharynx is clear.  Cardiovascular:     Rate and Rhythm: Normal rate and regular rhythm.     Pulses: Normal pulses.     Heart sounds: Normal heart sounds.  Pulmonary:     Effort: Pulmonary effort is normal.  Breath sounds: Normal breath sounds.  Abdominal:     General: Bowel sounds are normal.     Palpations: Abdomen is soft.     Tenderness: There is no abdominal tenderness.  Musculoskeletal:        General: Normal range of motion.  Skin:    General: Skin is warm and dry.     Coloration: Skin is not jaundiced.  Neurological:     Mental Status: She is alert and oriented to person, place, and time.     Imaging: MR Abdomen W Wo Contrast  Result Date: 06/27/2020 CLINICAL DATA:  Abdominal pain.  Pancreatic lesion on recent CT. EXAM: MRI ABDOMEN WITHOUT AND WITH CONTRAST TECHNIQUE: Multiplanar multisequence MR imaging of the abdomen was performed both before and after the administration of intravenous contrast. CONTRAST:  44mL GADAVIST GADOBUTROL 1 MMOL/ML IV SOLN COMPARISON:  CT on 05/23/2020 FINDINGS: Lower chest: No acute findings. Hepatobiliary: No hepatic masses identified. A few tiny sub-cm cysts are noted in the left lobe. Gallbladder is dilated and contains numerous tiny less than 1 cm gallstones. No evidence of acute cholecystitis. Diffuse biliary ductal dilatation is seen, with common bile duct  measuring 10 mm. Stricture of the distal common bile duct is seen at the level the pancreatic head. Pancreas: Mild pancreatic ductal dilatation is seen to the level of the pancreatic head. A poorly defined hypovascular mass is seen in the pancreatic head which measures 3.5 x 3.2 cm on image 57/22. This shows no evidence of vascular encasement. No evidence peripancreatic inflammatory changes or fluid collections. Spleen:  Within normal limits in size and appearance. Adrenals/Urinary Tract: No masses identified. A few tiny sub-cm left renal cysts are noted. No evidence of hydronephrosis. Stomach/Bowel: Visualized portion unremarkable. Vascular/Lymphatic: 1 cm peripancreatic lymph node seen along the anterior and superior margin of the pancreatic head, suspicious for metastatic disease. No other pathologically enlarged lymph nodes identified. No abdominal aortic aneurysm. Other:  None. Musculoskeletal:  No suspicious bone lesions identified. IMPRESSION: 3.5 cm ill-defined hypovascular mass in the pancreatic head, highly suspicious for pancreatic adenocarcinoma. This causes mild diffuse biliary and pancreatic ductal dilatation. 1 cm peripancreatic lymph node, suspicious for metastatic disease. Distended gallbladder with numerous small gallstones. No radiographic evidence of acute cholecystitis. These results will be called to the ordering clinician or representative by the Radiologist Assistant, and communication documented in the PACS or [employer]. Electronically Signed   By: Marlaine Hind M.D.   On: 06/27/2020 15:27    Labs:  CBC: Recent Labs    06/24/20 1452 07/05/20 1033 07/07/20 1333 07/08/20 0517  WBC 10.0 8.9 9.1 7.9  HGB 12.6 12.7 12.1 11.1*  HCT 40.0 39.4 37.4 34.2*  PLT 195 204 196 165    COAGS: Recent Labs    07/07/20 1333 07/08/20 0517  INR 1.2 1.2  APTT 24  --     BMP: Recent Labs    10/06/19 1056 04/08/20 1139 05/22/20 2212 06/24/20 1452 07/05/20 1033 07/07/20 1333  07/08/20 0517  NA 140 140   < > 141 140 139 141  K 4.6 4.3   < > 3.6 3.7 3.6 3.9  CL 102 103   < > 107 102 103 104  CO2 26 24   < > 22 23 20* 24  GLUCOSE 150* 156*   < > 166* 129* 172* 99  BUN 20 22   < > 13 13 13 13   CALCIUM 10.0 10.0   < > 9.5 9.4 9.4 9.1  CREATININE  1.17* 0.96*   < > 1.08* 0.97 1.18* 1.04*  GFRNONAA 45* 57*   < > 53* >60 48* 55*  GFRAA 52* 66  --   --   --   --   --    < > = values in this interval not displayed.    LIVER FUNCTION TESTS: Recent Labs    06/24/20 1452 07/05/20 1033 07/07/20 1333 07/08/20 0517  BILITOT 3.6* 4.6* 5.6* 5.6*  AST 191* 169* 218* 192*  ALT 121* 100* 113* 104*  ALKPHOS 157* 172* 179* 167*  PROT 8.0 8.0 7.8 6.8  ALBUMIN 3.9 3.9 3.7 3.2*    TUMOR MARKERS: No results for input(s): AFPTM, CEA, CA199, CHROMGRNA in the last 8760 hours.  Assessment and Plan:  Pancreatic mass with biliary obstruction; worsening bilirubin levels: Aalaiyah L.Levitan, 80 year-old female, presents today to the [facility] Interventional Radiology department for an image-guided percutaneous biliary drain.  Risks and benefits discussed with the patient including bleeding, infection, damage to adjacent structures and sepsis.  All of the patient's questions were answered, patient is agreeable to proceed. She has been NPO. Last dose of Lovenox (45 mg) was 07/07/20 at 2040.  Consent signed and in chart.  Thank you for this interesting consult.  I greatly enjoyed meeting SAAYA PROCELL and look forward to participating in their care.  A copy of this report was sent to the requesting provider on this date.  Electronically Signed: Soyla Dryer, AGACNP-BC [phone] 07/08/2020, 8:56 AM   I spent a total of 20 Minutes    in face to face in clinical consultation, greater than 50% of which was counseling/coordinating care for biliary drain.

## 2020-07-08 NOTE — Procedures (Signed)
Interventional Radiology Procedure Note  Procedure: left int ext biliary drain    Complications: None  Estimated Blood Loss:  min  Findings: 48fr drain inserted thru to the duodenum    Tamera Punt, MD

## 2020-07-08 NOTE — Progress Notes (Signed)
Patient clinically stable post Biliary tube placement per Dr Annamaria Boots, tolerated well. Received Versed 2 mg along with Fentanyl 150 mcg IV for procedure. Vitals stable pre and post procedure. 10 Fr drain placed, dressing dry/intact.report given to [place],

## 2020-07-08 NOTE — Progress Notes (Signed)
OT Cancellation Note  Patient Details Name: Tracy Bolton MRN: [id number] DOB: Dec 05, 1940   Cancelled Treatment:    Reason Eval/Treat Not Completed: Fatigue/lethargy limiting ability to participate. Orders received and chart reviewed. Attempted to evaluate pt this afternoon however pt deferring d/t fatigue following administration of pain medicine and recent biliary tube placement. Will attempt to evaluate at later time/date as able.    Fredirick Maudlin, OTR/L [place]

## 2020-07-08 NOTE — Plan of Care (Signed)
Shift Summary: Pt Aox4, on RA, VSS. No c/o pain overnight. NPO at midnight for IR drain placement, COVID PCR swab collected and results pending. LR infusing per MAR. Fall/safety precautions in place, rounding performed, needs/concerns addressed.   Problem: Education: Goal: Knowledge of General Education information will improve Description: Including pain rating scale, medication(s)/side effects and non-pharmacologic comfort measures Outcome: Progressing   Problem: Health Behavior/Discharge Planning: Goal: Ability to manage health-related needs will improve Outcome: Progressing   Problem: Clinical Measurements: Goal: Ability to maintain clinical measurements within normal limits will improve Outcome: Progressing Goal: Will remain free from infection Outcome: Progressing Goal: Diagnostic test results will improve Outcome: Progressing Goal: Respiratory complications will improve Outcome: Progressing Goal: Cardiovascular complication will be avoided Outcome: Progressing   Problem: Activity: Goal: Risk for activity intolerance will decrease Outcome: Progressing   Problem: Nutrition: Goal: Adequate nutrition will be maintained Outcome: Progressing   Problem: Coping: Goal: Level of anxiety will decrease Outcome: Progressing   Problem: Elimination: Goal: Will not experience complications related to bowel motility Outcome: Progressing Goal: Will not experience complications related to urinary retention Outcome: Progressing   Problem: Pain Managment: Goal: General experience of comfort will improve Outcome: Progressing   Problem: Safety: Goal: Ability to remain free from injury will improve Outcome: Progressing   Problem: Skin Integrity: Goal: Risk for impaired skin integrity will decrease Outcome: Progressing

## 2020-07-08 NOTE — Care Management Obs Status (Signed)
MEDICARE OBSERVATION STATUS NOTIFICATION   Patient Details  Name: Tracy Bolton MRN: [id number] Date of Birth: 02-14-1941   Medicare Observation Status Notification Given:  Yes    Shelbie Hutching, RN 07/08/2020, 2:55 PM

## 2020-07-08 NOTE — Patient Instructions (Signed)
Irinotecan injection What is this medicine? IRINOTECAN (ir in oh TEE kan ) is a chemotherapy drug. It is used to treat colon and rectal cancer. This medicine may be used for other purposes; ask your health care provider or pharmacist if you have questions. COMMON BRAND NAME(S): Camptosar What should I tell my health care provider before I take this medicine? They need to know if you have any of these conditions:  dehydration  diarrhea  infection (especially a virus infection such as chickenpox, cold sores, or herpes)  liver disease  low blood counts, like low white cell, platelet, or red cell counts  low levels of calcium, magnesium, or potassium in the blood  recent or ongoing radiation therapy  an unusual or allergic reaction to irinotecan, other medicines, foods, dyes, or preservatives  pregnant or trying to get pregnant  breast-feeding How should I use this medicine? This drug is given as an infusion into a vein. It is administered in a hospital or clinic by a specially trained health care professional. Talk to your pediatrician regarding the use of this medicine in children. Special care may be needed. Overdosage: If you think you have taken too much of this medicine contact a poison control center or emergency room at once. NOTE: This medicine is only for you. Do not share this medicine with others. What if I miss a dose? It is important not to miss your dose. Call your doctor or health care professional if you are unable to keep an appointment. What may interact with this medicine? Do not take this medicine with any of the following medications:  cobicistat  itraconazole This medicine may interact with the following medications:  antiviral medicines for HIV or AIDS  certain antibiotics like rifampin or rifabutin  certain medicines for fungal infections like ketoconazole, posaconazole, and voriconazole  certain medicines for seizures like carbamazepine,  phenobarbital, phenotoin  clarithromycin  gemfibrozil  nefazodone  St. John's Wort This list may not describe all possible interactions. Give your health care provider a list of all the medicines, herbs, non-prescription drugs, or dietary supplements you use. Also tell them if you smoke, drink alcohol, or use illegal drugs. Some items may interact with your medicine. What should I watch for while using this medicine? Your condition will be monitored carefully while you are receiving this medicine. You will need important blood work done while you are taking this medicine. This drug may make you feel generally unwell. This is not uncommon, as chemotherapy can affect healthy cells as well as cancer cells. Report any side effects. Continue your course of treatment even though you feel ill unless your doctor tells you to stop. In some cases, you may be given additional medicines to help with side effects. Follow all directions for their use. You may get drowsy or dizzy. Do not drive, use machinery, or do anything that needs mental alertness until you know how this medicine affects you. Do not stand or sit up quickly, especially if you are an older patient. This reduces the risk of dizzy or fainting spells. Call your health care professional for advice if you get a fever, chills, or sore throat, or other symptoms of a cold or flu. Do not treat yourself. This medicine decreases your body's ability to fight infections. Try to avoid being around people who are sick. Avoid taking products that contain aspirin, acetaminophen, ibuprofen, naproxen, or ketoprofen unless instructed by your doctor. These medicines may hide a fever. This medicine may increase your risk  to bruise or bleed. Call your doctor or health care professional if you notice any unusual bleeding. Be careful brushing and flossing your teeth or using a toothpick because you may get an infection or bleed more easily. If you have any dental work  done, tell your dentist you are receiving this medicine. Do not become pregnant while taking this medicine or for 6 months after stopping it. Women should inform their health care professional if they wish to become pregnant or think they might be pregnant. Men should not father a child while taking this medicine and for 3 months after stopping it. There is potential for serious side effects to an unborn child. Talk to your health care professional for more information. Do not breast-feed an infant while taking this medicine or for 7 days after stopping it. This medicine has caused ovarian failure in some women. This medicine may make it more difficult to get pregnant. Talk to your health care professional if you are concerned about your fertility. This medicine has caused decreased sperm counts in some men. This may make it more difficult to father a child. Talk to your health care professional if you are concerned about your fertility. What side effects may I notice from receiving this medicine? Side effects that you should report to your doctor or health care professional as soon as possible:  allergic reactions like skin rash, itching or hives, swelling of the face, lips, or tongue  chest pain  diarrhea  flushing, runny nose, sweating during infusion  low blood counts - this medicine may decrease the number of white blood cells, red blood cells and platelets. You may be at increased risk for infections and bleeding.  nausea, vomiting  pain, swelling, warmth in the leg  signs of decreased platelets or bleeding - bruising, pinpoint red spots on the skin, black, tarry stools, blood in the urine  signs of infection - fever or chills, cough, sore throat, pain or difficulty passing urine  signs of decreased red blood cells - unusually weak or tired, fainting spells, lightheadedness Side effects that usually do not require medical attention (report to your doctor or health care professional  if they continue or are bothersome):  constipation  hair loss  headache  loss of appetite  mouth sores  stomach pain This list may not describe all possible side effects. Call your doctor for medical advice about side effects. You may report side effects to FDA at 1-800-FDA-1088. Where should I keep my medicine? This drug is given in a hospital or clinic and will not be stored at home. NOTE: This sheet is a summary. It may not cover all possible information. If you have questions about this medicine, talk to your doctor, pharmacist, or health care provider.  2021 [employer] Standard (2019-02-10 17:46:13) Oxaliplatin Injection What is this medicine? OXALIPLATIN (ox AL i PLA tin) is a chemotherapy drug. It targets fast dividing cells, like cancer cells, and causes these cells to die. This medicine is used to treat cancers of the colon and rectum, and many other cancers. This medicine may be used for other purposes; ask your health care provider or pharmacist if you have questions. COMMON BRAND NAME(S): Eloxatin What should I tell my health care provider before I take this medicine? They need to know if you have any of these conditions:  heart disease  history of irregular heartbeat  liver disease  low blood counts, like white cells, platelets, or red blood cells  lung or breathing disease,  like asthma  take medicines that treat or prevent blood clots  tingling of the fingers or toes, or other nerve disorder  an unusual or allergic reaction to oxaliplatin, other chemotherapy, other medicines, foods, dyes, or preservatives  pregnant or trying to get pregnant  breast-feeding How should I use this medicine? This drug is given as an infusion into a vein. It is administered in a hospital or clinic by a specially trained health care professional. Talk to your pediatrician regarding the use of this medicine in children. Special care may be needed. Overdosage: If you think  you have taken too much of this medicine contact a poison control center or emergency room at once. NOTE: This medicine is only for you. Do not share this medicine with others. What if I miss a dose? It is important not to miss a dose. Call your doctor or health care professional if you are unable to keep an appointment. What may interact with this medicine? Do not take this medicine with any of the following medications:  cisapride  dronedarone  pimozide  thioridazine This medicine may also interact with the following medications:  aspirin and aspirin-like medicines  certain medicines that treat or prevent blood clots like warfarin, apixaban, dabigatran, and rivaroxaban  cisplatin  cyclosporine  diuretics  medicines for infection like acyclovir, adefovir, amphotericin B, bacitracin, cidofovir, foscarnet, ganciclovir, gentamicin, pentamidine, vancomycin  NSAIDs, medicines for pain and inflammation, like ibuprofen or naproxen  other medicines that prolong the QT interval (an abnormal heart rhythm)  pamidronate  zoledronic acid This list may not describe all possible interactions. Give your health care provider a list of all the medicines, herbs, non-prescription drugs, or dietary supplements you use. Also tell them if you smoke, drink alcohol, or use illegal drugs. Some items may interact with your medicine. What should I watch for while using this medicine? Your condition will be monitored carefully while you are receiving this medicine. You may need blood work done while you are taking this medicine. This medicine may make you feel generally unwell. This is not uncommon as chemotherapy can affect healthy cells as well as cancer cells. Report any side effects. Continue your course of treatment even though you feel ill unless your healthcare professional tells you to stop. This medicine can make you more sensitive to cold. Do not drink cold drinks or use ice. Cover exposed skin  before coming in contact with cold temperatures or cold objects. When out in cold weather wear warm clothing and cover your mouth and nose to warm the air that goes into your lungs. Tell your doctor if you get sensitive to the cold. Do not become pregnant while taking this medicine or for 9 months after stopping it. Women should inform their health care professional if they wish to become pregnant or think they might be pregnant. Men should not father a child while taking this medicine and for 6 months after stopping it. There is potential for serious side effects to an unborn child. Talk to your health care professional for more information. Do not breast-feed a child while taking this medicine or for 3 months after stopping it. This medicine has caused ovarian failure in some women. This medicine may make it more difficult to get pregnant. Talk to your health care professional if you are concerned about your fertility. This medicine has caused decreased sperm counts in some men. This may make it more difficult to father a child. Talk to your health care professional if you  are concerned about your fertility. This medicine may increase your risk of getting an infection. Call your health care professional for advice if you get a fever, chills, or sore throat, or other symptoms of a cold or flu. Do not treat yourself. Try to avoid being around people who are sick. Avoid taking medicines that contain aspirin, acetaminophen, ibuprofen, naproxen, or ketoprofen unless instructed by your health care professional. These medicines may hide a fever. Be careful brushing or flossing your teeth or using a toothpick because you may get an infection or bleed more easily. If you have any dental work done, tell your dentist you are receiving this medicine. What side effects may I notice from receiving this medicine? Side effects that you should report to your doctor or health care professional as soon as  possible:  allergic reactions like skin rash, itching or hives, swelling of the face, lips, or tongue  breathing problems  cough  low blood counts - this medicine may decrease the number of white blood cells, red blood cells, and platelets. You may be at increased risk for infections and bleeding  nausea, vomiting  pain, redness, or irritation at site where injected  pain, tingling, numbness in the hands or feet  signs and symptoms of bleeding such as bloody or black, tarry stools; red or dark brown urine; spitting up blood or brown material that looks like coffee grounds; red spots on the skin; unusual bruising or bleeding from the eyes, gums, or nose  signs and symptoms of a dangerous change in heartbeat or heart rhythm like chest pain; dizziness; fast, irregular heartbeat; palpitations; feeling faint or lightheaded; falls  signs and symptoms of infection like fever; chills; cough; sore throat; pain or trouble passing urine  signs and symptoms of liver injury like dark yellow or brown urine; general ill feeling or flu-like symptoms; light-colored stools; loss of appetite; nausea; right upper belly pain; unusually weak or tired; yellowing of the eyes or skin  signs and symptoms of low red blood cells or anemia such as unusually weak or tired; feeling faint or lightheaded; falls  signs and symptoms of muscle injury like dark urine; trouble passing urine or change in the amount of urine; unusually weak or tired; muscle pain; back pain Side effects that usually do not require medical attention (report to your doctor or health care professional if they continue or are bothersome):  changes in taste  diarrhea  gas  hair loss  loss of appetite  mouth sores This list may not describe all possible side effects. Call your doctor for medical advice about side effects. You may report side effects to FDA at 1-800-FDA-1088. Where should I keep my medicine? This drug is given in a  hospital or clinic and will not be stored at home. NOTE: This sheet is a summary. It may not cover all possible information. If you have questions about this medicine, talk to your doctor, pharmacist, or health care provider.  2021 [employer] Standard (2018-07-30 12:20:35) Fluorouracil, 5-FU injection What is this medicine? FLUOROURACIL, 5-FU (flure oh YOOR a sil) is a chemotherapy drug. It slows the growth of cancer cells. This medicine is used to treat many types of cancer like breast cancer, colon or rectal cancer, pancreatic cancer, and stomach cancer. This medicine may be used for other purposes; ask your health care provider or pharmacist if you have questions. COMMON BRAND NAME(S): Adrucil What should I tell my health care provider before I take this medicine? They need to  know if you have any of these conditions:  blood disorders  dihydropyrimidine dehydrogenase (DPD) deficiency  infection (especially a virus infection such as chickenpox, cold sores, or herpes)  kidney disease  liver disease  malnourished, poor nutrition  recent or ongoing radiation therapy  an unusual or allergic reaction to fluorouracil, other chemotherapy, other medicines, foods, dyes, or preservatives  pregnant or trying to get pregnant  breast-feeding How should I use this medicine? This drug is given as an infusion or injection into a vein. It is administered in a hospital or clinic by a specially trained health care professional. Talk to your pediatrician regarding the use of this medicine in children. Special care may be needed. Overdosage: If you think you have taken too much of this medicine contact a poison control center or emergency room at once. NOTE: This medicine is only for you. Do not share this medicine with others. What if I miss a dose? It is important not to miss your dose. Call your doctor or health care professional if you are unable to keep an appointment. What may interact  with this medicine? Do not take this medicine with any of the following medications:  live virus vaccines This medicine may also interact with the following medications:  medicines that treat or prevent blood clots like warfarin, enoxaparin, and dalteparin This list may not describe all possible interactions. Give your health care provider a list of all the medicines, herbs, non-prescription drugs, or dietary supplements you use. Also tell them if you smoke, drink alcohol, or use illegal drugs. Some items may interact with your medicine. What should I watch for while using this medicine? Visit your doctor for checks on your progress. This drug may make you feel generally unwell. This is not uncommon, as chemotherapy can affect healthy cells as well as cancer cells. Report any side effects. Continue your course of treatment even though you feel ill unless your doctor tells you to stop. In some cases, you may be given additional medicines to help with side effects. Follow all directions for their use. Call your doctor or health care professional for advice if you get a fever, chills or sore throat, or other symptoms of a cold or flu. Do not treat yourself. This drug decreases your body's ability to fight infections. Try to avoid being around people who are sick. This medicine may increase your risk to bruise or bleed. Call your doctor or health care professional if you notice any unusual bleeding. Be careful brushing and flossing your teeth or using a toothpick because you may get an infection or bleed more easily. If you have any dental work done, tell your dentist you are receiving this medicine. Avoid taking products that contain aspirin, acetaminophen, ibuprofen, naproxen, or ketoprofen unless instructed by your doctor. These medicines may hide a fever. Do not become pregnant while taking this medicine. Women should inform their doctor if they wish to become pregnant or think they might be pregnant.  There is a potential for serious side effects to an unborn child. Talk to your health care professional or pharmacist for more information. Do not breast-feed an infant while taking this medicine. Men should inform their doctor if they wish to father a child. This medicine may lower sperm counts. Do not treat diarrhea with over the counter products. Contact your doctor if you have diarrhea that lasts more than 2 days or if it is severe and watery. This medicine can make you more sensitive to the sun.  Keep out of the sun. If you cannot avoid being in the sun, wear protective clothing and use sunscreen. Do not use sun lamps or tanning beds/booths. What side effects may I notice from receiving this medicine? Side effects that you should report to your doctor or health care professional as soon as possible:  allergic reactions like skin rash, itching or hives, swelling of the face, lips, or tongue  low blood counts - this medicine may decrease the number of white blood cells, red blood cells and platelets. You may be at increased risk for infections and bleeding.  signs of infection - fever or chills, cough, sore throat, pain or difficulty passing urine  signs of decreased platelets or bleeding - bruising, pinpoint red spots on the skin, black, tarry stools, blood in the urine  signs of decreased red blood cells - unusually weak or tired, fainting spells, lightheadedness  breathing problems  changes in vision  chest pain  mouth sores  nausea and vomiting  pain, swelling, redness at site where injected  pain, tingling, numbness in the hands or feet  redness, swelling, or sores on hands or feet  stomach pain  unusual bleeding Side effects that usually do not require medical attention (report to your doctor or health care professional if they continue or are bothersome):  changes in finger or toe nails  diarrhea  dry or itchy skin  hair loss  headache  loss of  appetite  sensitivity of eyes to the light  stomach upset  unusually teary eyes This list may not describe all possible side effects. Call your doctor for medical advice about side effects. You may report side effects to FDA at 1-800-FDA-1088. Where should I keep my medicine? This drug is given in a hospital or clinic and will not be stored at home. NOTE: This sheet is a summary. It may not cover all possible information. If you have questions about this medicine, talk to your doctor, pharmacist, or health care provider.  2021 [employer] Standard (2019-02-10 15:00:03) Leucovorin injection What is this medicine? LEUCOVORIN (loo koe VOR in) is used to prevent or treat the harmful effects of some medicines. This medicine is used to treat anemia caused by a low amount of folic acid in the body. It is also used with 5-fluorouracil (5-FU) to treat colon cancer. This medicine may be used for other purposes; ask your health care provider or pharmacist if you have questions. What should I tell my health care provider before I take this medicine? They need to know if you have any of these conditions:  anemia from low levels of vitamin B-12 in the blood  an unusual or allergic reaction to leucovorin, folic acid, other medicines, foods, dyes, or preservatives  pregnant or trying to get pregnant  breast-feeding How should I use this medicine? This medicine is for injection into a muscle or into a vein. It is given by a health care professional in a hospital or clinic setting. Talk to your pediatrician regarding the use of this medicine in children. Special care may be needed. Overdosage: If you think you have taken too much of this medicine contact a poison control center or emergency room at once. NOTE: This medicine is only for you. Do not share this medicine with others. What if I miss a dose? This does not apply. What may interact with this  medicine?  capecitabine  fluorouracil  phenobarbital  phenytoin  primidone  trimethoprim-sulfamethoxazole This list may not describe all possible interactions.  Give your health care provider a list of all the medicines, herbs, non-prescription drugs, or dietary supplements you use. Also tell them if you smoke, drink alcohol, or use illegal drugs. Some items may interact with your medicine. What should I watch for while using this medicine? Your condition will be monitored carefully while you are receiving this medicine. This medicine may increase the side effects of 5-fluorouracil, 5-FU. Tell your doctor or health care professional if you have diarrhea or mouth sores that do not get better or that get worse. What side effects may I notice from receiving this medicine? Side effects that you should report to your doctor or health care professional as soon as possible:  allergic reactions like skin rash, itching or hives, swelling of the face, lips, or tongue  breathing problems  fever, infection  mouth sores  unusual bleeding or bruising  unusually weak or tired Side effects that usually do not require medical attention (report to your doctor or health care professional if they continue or are bothersome):  constipation or diarrhea  loss of appetite  nausea, vomiting This list may not describe all possible side effects. Call your doctor for medical advice about side effects. You may report side effects to FDA at 1-800-FDA-1088. Where should I keep my medicine? This drug is given in a hospital or clinic and will not be stored at home. NOTE: This sheet is a summary. It may not cover all possible information. If you have questions about this medicine, talk to your doctor, pharmacist, or health care provider.  2021 [employer] Standard (2007-09-16 16:50:29)

## 2020-07-09 DIAGNOSIS — K831 Obstruction of bile duct: Secondary | ICD-10-CM | POA: Diagnosis not present

## 2020-07-09 LAB — COMPREHENSIVE METABOLIC PANEL
ALT: 119 U/L — ABNORMAL HIGH (ref 0–44)
AST: 227 U/L — ABNORMAL HIGH (ref 15–41)
Albumin: 3.2 g/dL — ABNORMAL LOW (ref 3.5–5.0)
Alkaline Phosphatase: 196 U/L — ABNORMAL HIGH (ref 38–126)
Anion gap: 12 (ref 5–15)
BUN: 10 mg/dL (ref 8–23)
CO2: 25 mmol/L (ref 22–32)
Calcium: 9 mg/dL (ref 8.9–10.3)
Chloride: 101 mmol/L (ref 98–111)
Creatinine, Ser: 0.85 mg/dL (ref 0.44–1.00)
GFR, Estimated: >60 mL/min (ref >60)
Glucose, Bld: 103 mg/dL — ABNORMAL HIGH (ref 70–99)
Potassium: 3.6 mmol/L (ref 3.5–5.1)
Sodium: 138 mmol/L (ref 135–145)
Total Bilirubin: 4.9 mg/dL — ABNORMAL HIGH (ref 0.3–1.2)
Total Protein: 7.1 g/dL (ref 6.5–8.1)

## 2020-07-09 LAB — PROTIME-INR
INR: 1.1 (ref 0.8–1.2)
Prothrombin Time: 14.7 seconds (ref 11.4–15.2)

## 2020-07-09 LAB — CBC
HCT: 34.4 % — ABNORMAL LOW (ref 36.0–46.0)
Hemoglobin: 11.3 g/dL — ABNORMAL LOW (ref 12.0–15.0)
MCH: 27 pg (ref 26.0–34.0)
MCHC: 32.8 g/dL (ref 30.0–36.0)
MCV: 82.1 fL (ref 80.0–100.0)
Platelets: 161 10*3/uL (ref 150–400)
RBC: 4.19 MIL/uL (ref 3.87–5.11)
RDW: 15.9 % — ABNORMAL HIGH (ref 11.5–15.5)
WBC: 9 10*3/uL (ref 4.0–10.5)
nRBC: 0 % (ref 0.0–0.2)

## 2020-07-09 LAB — LIPASE, BLOOD: Lipase: 258 U/L — ABNORMAL HIGH (ref 11–51)

## 2020-07-09 MED ORDER — OXYCODONE HCL 5 MG PO TABS: 5.0000 mg | ORAL_TABLET | Freq: Four times a day (QID) | ORAL | 0 refills | Status: AC | PRN

## 2020-07-09 MED ORDER — SENNA 8.6 MG PO TABS: 1.0000 | ORAL_TABLET | Freq: Every evening | ORAL | 0 refills | Status: DC | PRN

## 2020-07-09 NOTE — Plan of Care (Signed)
Shift Summary: Pt orientedx3, thought the day was Saturday but easily reoriented. VSS, on RA. LR infusing per MAR. C/o addressed with PRN oxycodone with adequate relief. Biliary drain dressing CDI. Fall/safety precautions in place, rounding performed, needs/concerns addressed.  Problem: Education: Goal: Knowledge of General Education information will improve Description: Including pain rating scale, medication(s)/side effects and non-pharmacologic comfort measures Outcome: Progressing   Problem: Health Behavior/Discharge Planning: Goal: Ability to manage health-related needs will improve Outcome: Progressing   Problem: Clinical Measurements: Goal: Ability to maintain clinical measurements within normal limits will improve Outcome: Progressing Goal: Will remain free from infection Outcome: Progressing Goal: Diagnostic test results will improve Outcome: Progressing Goal: Respiratory complications will improve Outcome: Progressing Goal: Cardiovascular complication will be avoided Outcome: Progressing   Problem: Activity: Goal: Risk for activity intolerance will decrease Outcome: Progressing   Problem: Nutrition: Goal: Adequate nutrition will be maintained Outcome: Progressing   Problem: Coping: Goal: Level of anxiety will decrease Outcome: Progressing   Problem: Elimination: Goal: Will not experience complications related to bowel motility Outcome: Progressing Goal: Will not experience complications related to urinary retention Outcome: Progressing   Problem: Pain Managment: Goal: General experience of comfort will improve Outcome: Progressing   Problem: Safety: Goal: Ability to remain free from injury will improve Outcome: Progressing   Problem: Skin Integrity: Goal: Risk for impaired skin integrity will decrease Outcome: Progressing

## 2020-07-09 NOTE — Progress Notes (Signed)
PT Cancellation Note  Patient Details Name: Tracy Bolton MRN: [id number] DOB: [birth year]   Cancelled Treatment:    Reason Eval/Treat Not Completed: PT screened, no needs identified, will sign off.  After chart reviewing and speaking to nurse and OT, pt is at baseline level and ready to be d/c at this time.  Patient is at baseline, all education completed, and time is given to address all questions/concerns. No additional skilled PT services needed at this time, PT signing off. PT recommends daily ambulation ad lib or with nursing staff as needed to prevent deconditioning.    Gwenlyn Saran, PT, DPT 07/09/20, 12:11 PM

## 2020-07-09 NOTE — Evaluation (Signed)
Occupational Therapy Evaluation Patient Details Name: Tracy Bolton MRN: [id number] DOB: 24-Jan-1941 Today's Date: 07/09/2020    History of Present Illness Pt is a 80 y/o F with PMH: HTN, DM and cholelithiasis periumbilical pain umbilical hernia followed by Dr. Hampton Abbot. 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 Pt presented to [facility] ED d/t Biliary obstruction secondary to newly diagnosed pancreatic mass and is now status post IR drain placement 4/15 at [facility].   Clinical Impression   Pt seen for OT evaluation this date in setting of acute hospitalization d/t biliary obstruction. Pt reports being INDEP at baseline including driving and running her own errands. Pt presents this date with no obvious focal deficits in terms of balance or strength relative to her functional baseilne. Pt able to complete ADL transfers with SUPV initially with progress to MOD I with use of her own personal 2WW. Pt able to perform fxl mobility with MOD I in the room for short distances with no gross LOB. In addition, OT engages pt in bathing/dressing tasks. SUPV provided throughout bathing for safety, but pt needs no physical assistance or cueing and has shower chair at home. Pt does require MOD A to don socks, but reports having and using sock-aide adaptive equipment at home on a regular basis. Do not anticipate further OT needs at this time. Will complete order.     Follow Up Recommendations  No OT follow up    Equipment Recommendations  None recommended by OT    Recommendations for Other Services       Precautions / Restrictions Precautions Precautions: Fall Precaution Comments: low Restrictions Weight Bearing Restrictions: No      Mobility Bed Mobility Overal bed mobility: Independent                  Transfers Overall transfer level: Modified independent Equipment used: Rolling walker (2 wheeled)                  Balance Overall balance assessment: Mild deficits  observed, not formally tested                                         ADL either performed or assessed with clinical judgement   ADL Overall ADL's : Modified independent;At baseline                                       General ADL Comments: difficulty with LB ADLs d/t pressure in abdomen, uses sock aide at home. Otherwise able to perform ADLs at [facility] to MOD I level on assessment     Vision Patient Visual Report: No change from baseline       Perception     Praxis      Pertinent Vitals/Pain Pain Assessment: 0-10 Pain Score: 3  Pain Location: back Pain Descriptors / Indicators: Aching Pain Intervention(s): Limited activity within patient's tolerance;Monitored during session     Hand Dominance     Extremity/Trunk Assessment Upper Extremity Assessment Upper Extremity Assessment: Overall WFL for tasks assessed;Generalized weakness (ROM WFL, MMT grossly 4-/5)   Lower Extremity Assessment Lower Extremity Assessment: Overall WFL for tasks assessed;Generalized weakness       Communication Communication Communication: No difficulties   Cognition Arousal/Alertness: Awake/alert Behavior During Therapy: WFL for tasks assessed/performed Overall  Cognitive Status: Within Functional Limits for tasks assessed                                     General Comments       Exercises Other Exercises Other Exercises: facilitates ed re: role of OT in acute setting, safety considerations. Pt engaged in bathing and dressing tasks. Toelrates well.   Shoulder Instructions      Home Living Family/patient expects to be discharged to:: Private residence Living Arrangements: Alone Available Help at Discharge: Family;Available PRN/intermittently (dtr close) Type of Home: House Home Access: Stairs to enter Entrance Stairs-Number of Steps: 1   Home Layout: One level         Bathroom Toilet: Standard     Home Equipment: Environmental consultant - 2  wheels;Shower Theme park manager: Sock aid        Prior Functioning/Environment Level of Independence: Independent with assistive device(s)        Comments: drives, runs own errands, uses 2WW for home and community amb.        OT Problem List: Decreased activity tolerance;Pain      OT Treatment/Interventions: Self-care/ADL training;Therapeutic activities    OT Goals(Current goals can be found in the care plan section) Acute Rehab OT Goals Patient Stated Goal: to get my back feeling better and then go home OT Goal Formulation: All assessment and education complete, DC therapy  OT Frequency:     Barriers to D/C:            Co-evaluation              AM-PAC OT "6 Clicks" Daily Activity     Outcome Measure Help from another person eating meals?: None Help from another person taking care of personal grooming?: None Help from another person toileting, which includes using toliet, bedpan, or urinal?: None Help from another person bathing (including washing, rinsing, drying)?: A Little (SUPV) Help from another person to put on and taking off regular upper body clothing?: None Help from another person to put on and taking off regular lower body clothing?: None 6 Click Score: 23   End of Session Equipment Utilized During Treatment: Rolling walker Nurse Communication: Mobility status  Activity Tolerance: Patient tolerated treatment well Patient left: in bed;with call bell/phone within reach  OT Visit Diagnosis: Muscle weakness (generalized) (M62.81)                Time: 7564-3329 OT Time Calculation (min): 36 min Charges:  OT General Charges $OT Visit: 1 Visit OT Evaluation $OT Eval Moderate Complexity: 1 Mod OT Treatments $Self Care/Home Management : 8-22 mins $Therapeutic Activity: 8-22 mins  Gerrianne Scale, MS, OTR/L ascom [phone] 07/09/20, 12:10 PM

## 2020-07-09 NOTE — Discharge Summary (Addendum)
Physician Discharge Summary  Tracy Bolton UQJ:[id number] DOB: 07-05-1940 DOA: 07/07/2020  PCP: Delsa Grana, PA-C  Admit date: 07/07/2020 Discharge date: 07/09/2020  Time spent: 20 minutes  Recommendations for Outpatient Follow-up:  1. Outpatient care consolidation with oncology-I have intimated her oncologist  2. Outpatient Chem-12 1 week  Discharge Diagnoses:  MAIN problem for hospitalization   Biliary stricture status post procedure as below  Please see below for itemized issues addressed in HOpsital- refer to other progress notes for clarity if needed  Discharge Condition: Improved  Diet recommendation: Heart healthy  Filed Weights   07/07/20 1834  Weight: 90.9 kg    History of present illness:  80 year old female community dwelling Prior history of cholelithiasis periumbilical pain umbilical hernia followed by Dr. 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 Cephas Darby 07/01/2020 unable to traverse the stricture-duodenal biopsy positive for adenocarcinoma--patient was being set up for IR guided drainage at [facility] and was in the process of being referred to [facility] surgery Dr. 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 Grayland Ormond added to treatment team Lipase elevated however patient completely asymptomatic tolerated breakfast of grits eggs banana etc. this morning Family/patient will learn drain management--Home health engaged and requested to help with  follow-up Patient maximally benefited from overnight stay and can discharge with close follow-up with her oncologist who has been CCed HTN Resume amlodipine 10, atenolol 50 DM TY 2 Continue sliding scale coverage Resume Metformin 1000 XR every morning  Procedures:  Internal and external biliary drain placement 4/15 Dr. Reesa Chew  Consultations:  IR  Discharge Exam: Vitals:   07/09/20 0612 07/09/20 0747  BP: 130/69 112/64  Pulse: 64 65  Resp: 16 17  Temp: 98.3 F (36.8 C) 97.8 F (36.6 C)  SpO2: 95% 97%    Subj on day of d/c   Awake coherent no distress sitting up in bed no pain no fever no chills  General Exam on discharge  EOMI NCAT no focal deficit CTA B no added sound no rales rhonchi Bandage overlying epigastrium with dark draining bilious material in bag ROM intact no lower extremity edema  Discharge Instructions   Discharge Instructions    Diet - low sodium heart healthy   Complete by: As directed    Discharge instructions   Complete by: As directed    Please ensure that you sparingly take your pain medications Also ensure that you take your laxatives You will need close follow-up with either your [facility] or your local oncologist for coordination of the next steps of your care for your pancreatic process You will also need lab work in about 1 week If you have any nausea vomiting or any other issues you need to report this to your family doctor   Increase activity slowly   Complete by: As directed      Allergies as of 07/09/2020   No Known Allergies     Medication List    STOP taking these medications   Multi-Vitamins Tabs     TAKE these medications   allopurinol 100 MG  tablet Commonly known as: ZYLOPRIM TAKE 1 TABLET BY MOUTH EVERY DAY   amLODipine 10 MG tablet Commonly known as: NORVASC TAKE 1 TABLET BY MOUTH EVERY DAY   aspirin 81 MG tablet Take 81 mg by mouth.   atenolol 50 MG tablet Commonly known as: TENORMIN TAKE 1 TABLET BY MOUTH  EVERY DAY   atorvastatin 10 MG tablet Commonly known as: LIPITOR TAKE 1 TABLET BY MOUTH EVERYDAY AT BEDTIME   lidocaine-prilocaine cream Commonly known as: EMLA Apply 30 -45 mins prior to port access.   metFORMIN 500 MG 24 hr tablet Commonly known as: GLUCOPHAGE-XR TAKE 2 TABLETS BY MOUTH EVERY DAY   nystatin powder Commonly known as: MYCOSTATIN/NYSTOP APPLY TO AFFECTED AREA OF SKIN 3X A DAY AS NEEDED IN A SUFFICIENT AMOUNT   oxyCODONE 5 MG immediate release tablet Commonly known as: Oxy IR/ROXICODONE Take 1 tablet (5 mg total) by mouth every 6 (six) hours as needed for severe pain or breakthrough pain.   pantoprazole 40 MG tablet Commonly known as: Protonix Take 1 tablet (40 mg total) by mouth daily.   prochlorperazine 10 MG tablet Commonly known as: COMPAZINE Take 1 tablet (10 mg total) by mouth every 6 (six) hours as needed for nausea or vomiting.   senna 8.6 MG Tabs tablet Commonly known as: SENOKOT Take 1 tablet (8.6 mg total) by mouth at bedtime as needed for mild constipation.      No Known Allergies    The results of significant diagnostics from this hospitalization (including imaging, microbiology, ancillary and laboratory) are listed below for reference.    Significant Diagnostic Studies: MR Abdomen W Wo Contrast  Result Date: 06/27/2020 CLINICAL DATA:  Abdominal pain.  Pancreatic lesion on recent CT. EXAM: MRI ABDOMEN WITHOUT AND WITH CONTRAST TECHNIQUE: Multiplanar multisequence MR imaging of the abdomen was performed both before and after the administration of intravenous contrast. CONTRAST:  75mL GADAVIST GADOBUTROL 1 MMOL/ML IV SOLN COMPARISON:  CT on 05/23/2020 FINDINGS: Lower chest: No acute findings. Hepatobiliary: No hepatic masses identified. A few tiny sub-cm cysts are noted in the left lobe. Gallbladder is dilated and contains numerous tiny less than 1 cm gallstones. No evidence of acute cholecystitis. Diffuse biliary ductal dilatation is seen, with  common bile duct measuring 10 mm. Stricture of the distal common bile duct is seen at the level the pancreatic head. Pancreas: Mild pancreatic ductal dilatation is seen to the level of the pancreatic head. A poorly defined hypovascular mass is seen in the pancreatic head which measures 3.5 x 3.2 cm on image 57/22. This shows no evidence of vascular encasement. No evidence peripancreatic inflammatory changes or fluid collections. Spleen:  Within normal limits in size and appearance. Adrenals/Urinary Tract: No masses identified. A few tiny sub-cm left renal cysts are noted. No evidence of hydronephrosis. Stomach/Bowel: Visualized portion unremarkable. Vascular/Lymphatic: 1 cm peripancreatic lymph node seen along the anterior and superior margin of the pancreatic head, suspicious for metastatic disease. No other pathologically enlarged lymph nodes identified. No abdominal aortic aneurysm. Other:  None. Musculoskeletal:  No suspicious bone lesions identified. IMPRESSION: 3.5 cm ill-defined hypovascular mass in the pancreatic head, highly suspicious for pancreatic adenocarcinoma. This causes mild diffuse biliary and pancreatic ductal dilatation. 1 cm peripancreatic lymph node, suspicious for metastatic disease. Distended gallbladder with numerous small gallstones. No radiographic evidence of acute cholecystitis. These results will be called to the ordering clinician or representative by the Radiologist Assistant, and communication documented in the PACS or [employer]. Electronically Signed   By:  Marlaine Hind M.D.   On: 06/27/2020 15:27   [place] Intraoperative  Result Date: 07/08/2020 INDICATION: Jaundice, biliary obstruction, pancreas cancer, successful ERCP EXAM: ULTRASOUND FLUOROSCOPIC LEFT INTERNAL EXTERNAL 10 FRENCH BILIARY DRAIN MEDICATIONS: 2 g Mefoxin; The antibiotic was administered within an appropriate time frame prior to the initiation of the procedure. ANESTHESIA/SEDATION: Moderate (conscious) sedation  was employed during this procedure. A total of Versed 2.0 mg and Fentanyl 150 mcg was administered intravenously. Moderate Sedation Time: 32 minutes. The patient's level of consciousness and vital signs were monitored continuously by radiology nursing throughout the procedure under my direct supervision. FLUOROSCOPY TIME:  Fluoroscopy Time: 5 minutes 12 seconds (89 mGy). COMPLICATIONS: None immediate. PROCEDURE: Informed written consent was obtained from the patient after a thorough discussion of the procedural risks, benefits and alternatives. All questions were addressed. Maximal Sterile Barrier Technique was utilized including caps, mask, sterile gowns, sterile gloves, sterile drape, hand hygiene and skin antiseptic. A timeout was performed prior to the initiation of the procedure. Previous imaging reviewed. Preliminary ultrasound performed of the liver. Left hepatic ducts were localized from a subxiphoid approach. Overlying skin marked. Under sterile conditions and local anesthesia, a 21 gauge 15 cm access was advanced percutaneously under ultrasound into a peripheral left hepatic duct. Needle position confirmed with ultrasound and return of clear bile. Ultrasound images obtained for documentation. Contrast injection performed for initial cholangiogram. This demonstrates diffuse biliary dilatation and distal obstruction. 018 guidewire advanced into the biliary tree followed by the transitional dilator set. Amplatz guidewire advanced followed by a Kumpe catheter. Kumpe catheter advanced into the duodenum. Contrast injection confirms position in the duodenum. Over an Amplatz guidewire, tract dilatation performed to insert a 10 [place] internal external biliary drain. Retention loop positioned in the duodenum. Contrast injection confirms position. Images obtained for documentation. Cholangiogram confirms biliary obstruction. Cystic duct is patent with numerous gallstones noted. IMPRESSION: Successful 10 French left  internal external biliary drain for distal CBD obstruction. Electronically Signed   By: Jerilynn Mages.  Shick M.D.   On: 07/08/2020 12:19   IR BILIARY DRAIN PLACEMENT WITH CHOLANGIOGRAM  Result Date: 07/08/2020 INDICATION: Jaundice, biliary obstruction, pancreas cancer, successful ERCP EXAM: ULTRASOUND FLUOROSCOPIC LEFT INTERNAL EXTERNAL 10 FRENCH BILIARY DRAIN MEDICATIONS: 2 g Mefoxin; The antibiotic was administered within an appropriate time frame prior to the initiation of the procedure. ANESTHESIA/SEDATION: Moderate (conscious) sedation was employed during this procedure. A total of Versed 2.0 mg and Fentanyl 150 mcg was administered intravenously. Moderate Sedation Time: 32 minutes. The patient's level of consciousness and vital signs were monitored continuously by radiology nursing throughout the procedure under my direct supervision. FLUOROSCOPY TIME:  Fluoroscopy Time: 5 minutes 12 seconds (89 mGy). COMPLICATIONS: None immediate. PROCEDURE: Informed written consent was obtained from the patient after a thorough discussion of the procedural risks, benefits and alternatives. All questions were addressed. Maximal Sterile Barrier Technique was utilized including caps, mask, sterile gowns, sterile gloves, sterile drape, hand hygiene and skin antiseptic. A timeout was performed prior to the initiation of the procedure. Previous imaging reviewed. Preliminary ultrasound performed of the liver. Left hepatic ducts were localized from a subxiphoid approach. Overlying skin marked. Under sterile conditions and local anesthesia, a 21 gauge 15 cm access was advanced percutaneously under ultrasound into a peripheral left hepatic duct. Needle position confirmed with ultrasound and return of clear bile. Ultrasound images obtained for documentation. Contrast injection performed for initial cholangiogram. This demonstrates diffuse biliary dilatation and distal obstruction. 018 guidewire advanced into the biliary tree followed by the  transitional dilator set. Amplatz guidewire advanced followed by a Kumpe catheter. Kumpe catheter advanced into the duodenum. Contrast injection confirms position in the duodenum. Over an Amplatz guidewire, tract dilatation performed to insert a 10 [place] internal external biliary drain. Retention loop positioned in the duodenum. Contrast injection confirms position. Images obtained for documentation. Cholangiogram confirms biliary obstruction. Cystic duct is patent with numerous gallstones noted. IMPRESSION: Successful 10 French left internal external biliary drain for distal CBD obstruction. Electronically Signed   By: Jerilynn Mages.  Shick M.D.   On: 07/08/2020 12:19    Microbiology: Recent Results (from the past 240 hour(s))  SARS CORONAVIRUS 2 (TAT 6-24 HRS) Nasopharyngeal Nasopharyngeal Swab     Status: None   Collection Time: 07/08/20  1:00 AM   Specimen: Nasopharyngeal Swab  Result Value Ref Range Status   SARS Coronavirus 2 NEGATIVE NEGATIVE Final    Comment: (NOTE) SARS-CoV-2 target nucleic acids are NOT DETECTED.  The SARS-CoV-2 RNA is generally detectable in upper and lower respiratory specimens during the acute phase of infection. Negative results do not preclude SARS-CoV-2 infection, do not rule out co-infections with other pathogens, and should not be used as the sole basis for treatment or other patient management decisions. Negative results must be combined with clinical observations, patient history, and epidemiological information. The expected result is Negative.  Fact Sheet for Patients: SugarRoll.be  Fact Sheet for Healthcare Providers: https://www.woods-mathews.com/  This test is not yet approved or cleared by the [place] FDA and  has been authorized for detection and/or diagnosis of SARS-CoV-2 by FDA under an Emergency Use Authorization (EUA). This EUA will remain  in effect (meaning this test can be used) for the duration of  the COVID-19 declaration under Se ction 564(b)(1) of the Act, 21 U.S.C. section 360bbb-3(b)(1), unless the authorization is terminated or revoked sooner.  Performed at [facility], [address] [place], [place] [address]      Labs: Basic Metabolic Panel: Recent Labs  Lab 07/05/20 1033 07/07/20 1333 07/08/20 0517 07/09/20 0405  NA 140 139 141 138  K 3.7 3.6 3.9 3.6  CL 102 103 104 101  CO2 23 20* 24 25  GLUCOSE 129* 172* 99 103*  BUN 13 13 13 10   CREATININE 0.97 1.18* 1.04* 0.85  CALCIUM 9.4 9.4 9.1 9.0  MG  --   --  1.5*  --   PHOS  --   --  2.8  --    Liver Function Tests: Recent Labs  Lab 07/05/20 1033 07/07/20 1333 07/08/20 0517 07/09/20 0405  AST 169* 218* 192* 227*  ALT 100* 113* 104* 119*  ALKPHOS 172* 179* 167* 196*  BILITOT 4.6* 5.6* 5.6* 4.9*  PROT 8.0 7.8 6.8 7.1  ALBUMIN 3.9 3.7 3.2* 3.2*   Recent Labs  Lab 07/09/20 0405  LIPASE 258*   No results for input(s): AMMONIA in the last 168 hours. CBC: Recent Labs  Lab 07/05/20 1033 07/07/20 1333 07/08/20 0517 07/09/20 0405  WBC 8.9 9.1 7.9 9.0  NEUTROABS 5.6 6.3 4.6  --   HGB 12.7 12.1 11.1* 11.3*  HCT 39.4 37.4 34.2* 34.4*  MCV 84.0 83.7 82.6 82.1  PLT 204 196 165 161   Cardiac Enzymes: No results for input(s): CKTOTAL, CKMB, CKMBINDEX, TROPONINI in the last 168 hours. BNP: BNP (last 3 results) No results for input(s): BNP in the last 8760 hours.  ProBNP (last 3 results) No results for input(s): PROBNP in the last 8760 hours.  CBG: Recent Labs  Lab  07/07/20 2045 07/08/20 0734 07/08/20 1833 07/08/20 2130  GLUCAP 117* 93 175* 139*       Signed:  Nita Sells MD   [facility] 07/09/2020, 10:49 AM

## 2020-07-09 NOTE — Progress Notes (Signed)
Pt stated her daughter would be here for discharge around 12 or 1.

## 2020-07-09 NOTE — TOC Transition Note (Signed)
Transition of Care [facility]) - CM/SW Discharge Note   Patient Details  Name: Tracy Bolton MRN: [id number] Date of Birth: [birth year]  [facility] CM/SW Contact:  Trecia Rogers, [place] Phone Number: 07/09/2020, 11:21 AM   Clinical Narrative:     CSW left information with [place] at [facility] that patient is discharging today. Per past notes, home health will start on Monday.  CSW called patient's daughter, Olin Hauser, who will be picking up the patient at 1pm.   No other needs.   Final next level of care: [place] Barriers to Discharge: No Barriers Identified   Patient Goals and CMS Choice Patient states their goals for this hospitalization and ongoing recovery are:: would like to go home and would like home health services CMS Medicare.gov Compare Post Acute Care list provided to:: Patient Choice offered to / list presented to : Patient  Discharge Placement                  Name of family member notified: Daughter, Olin Hauser Patient and family notified of of transfer: 07/09/20  Discharge Plan and Services   Discharge Planning Services: CM Consult Post Acute Care Choice: Home Health          DME Arranged: N/A DME Agency: NA       HH Arranged: RN [place] Agency: [facility] Date [place]: 07/08/20 Time [place]: 2257    Social Determinants of Health (SDOH) Interventions     Readmission Risk Interventions No flowsheet data found.

## 2020-07-11 ENCOUNTER — Other Ambulatory Visit: Payer: Self-pay

## 2020-07-11 ENCOUNTER — Telehealth: Payer: Self-pay | Admitting: *Deleted

## 2020-07-11 ENCOUNTER — Inpatient Hospital Stay: Payer: Medicare PPO

## 2020-07-11 LAB — GLUCOSE, CAPILLARY
Glucose-Capillary: 98 mg/dL (ref 70–99)
Glucose-Capillary: 125 mg/dL — ABNORMAL HIGH (ref 70–99)

## 2020-07-11 NOTE — Telephone Encounter (Signed)
Tracy Le- please follow-up on port cath placement s/p [facility] apt.

## 2020-07-11 NOTE — Telephone Encounter (Signed)
Followed up with Dr. Rogue Bussing regarding patient's port placement.   Per Dr. Jacinto Reap "hold off the port; and chemo education for now- as pt has evaluation on [facility] for possilble upfront surgery tomorrow." "if [facility] says no surgery upfront then- will have to go chemo; port route. hopefully we will have more clarity in next couple of days"

## 2020-07-13 ENCOUNTER — Telehealth: Payer: Self-pay | Admitting: Internal Medicine

## 2020-07-13 NOTE — Telephone Encounter (Signed)
Reviewed chart. RN did not see any apts at [facility] for this week on patient's chart in care everywhere.  RN contacted patient. She still does not have any apts at [facility]

## 2020-07-13 NOTE — Telephone Encounter (Signed)
Tracy Bolton will you follow-up on patient's apt at [facility]. Dr. B spoke with the [place] provider. They have been trying to reach the patient but has been unsucessful (per dr. B).

## 2020-07-13 NOTE — Telephone Encounter (Signed)
On 4/20-I spoke to Dr. Manuella Ghazi at [facility] regarding patient appointments.   As a follow-up recall patient-awaiting appointment at [facility] on the 4/27.  Recommend patient keep appointment with [place] as planned.   GB

## 2020-07-14 ENCOUNTER — Other Ambulatory Visit: Payer: Self-pay

## 2020-07-14 ENCOUNTER — Telehealth: Payer: Self-pay | Admitting: *Deleted

## 2020-07-14 ENCOUNTER — Ambulatory Visit
Admission: RE | Admit: 2020-07-14 | Discharge: 2020-07-14 | Disposition: A | Discharge status: Home / Self Care | Payer: Medicare PPO | Source: Ambulatory Visit | Attending: Internal Medicine | Admitting: Internal Medicine

## 2020-07-14 DIAGNOSIS — K8689 Other specified diseases of pancreas: Secondary | ICD-10-CM | POA: Insufficient documentation

## 2020-07-14 DIAGNOSIS — C259 Malignant neoplasm of pancreas, unspecified: Secondary | ICD-10-CM

## 2020-07-14 LAB — GLUCOSE, CAPILLARY: Glucose-Capillary: 101 mg/dL — ABNORMAL HIGH (ref 70–99)

## 2020-07-14 MED ORDER — FLUDEOXYGLUCOSE F - 18 (FDG) INJECTION
10.2000 | Freq: Once | INTRAVENOUS | Status: AC | PRN
  Administered 2020-07-14: 10.2 via INTRAVENOUS

## 2020-07-14 NOTE — Telephone Encounter (Signed)
Called addended report  ADDENDUM REPORT: 07/14/2020 16:13  ADDENDUM: Note that there is some mild to moderate airway narrowing at the thoracic inlet. The patient has a chronic mass in this area. Would still however correlate with any respiratory symptoms given the marked displacement of the trachea. For reference the trachea in the upper chest is 15 mm and at the thoracic inlet is 9 mm greatest width.  Note also that nodular areas in the chest are without increased metabolic activity.  These results will be called to the ordering clinician or representative by the Radiologist Assistant, and communication documented in the PACS or [employer].   Electronically Signed   By: Zetta Bills M.D.   On: 07/14/2020 16:13

## 2020-07-14 NOTE — Telephone Encounter (Signed)
FYI- Dr. Jacinto Reap - Patient's daughter has fmla. Lovena Le will complete these once the plan of care is known

## 2020-07-15 ENCOUNTER — Encounter: Payer: Self-pay | Admitting: Internal Medicine

## 2020-07-15 ENCOUNTER — Inpatient Hospital Stay (HOSPITAL_BASED_OUTPATIENT_CLINIC_OR_DEPARTMENT_OTHER): Payer: Medicare PPO | Admitting: Internal Medicine

## 2020-07-15 ENCOUNTER — Inpatient Hospital Stay: Payer: Medicare PPO

## 2020-07-15 VITALS — BP 124/79 | HR 57

## 2020-07-15 DIAGNOSIS — C259 Malignant neoplasm of pancreas, unspecified: Secondary | ICD-10-CM

## 2020-07-15 DIAGNOSIS — K8689 Other specified diseases of pancreas: Secondary | ICD-10-CM | POA: Diagnosis not present

## 2020-07-15 LAB — COMPREHENSIVE METABOLIC PANEL
ALT: 109 U/L — ABNORMAL HIGH (ref 0–44)
AST: 236 U/L — ABNORMAL HIGH (ref 15–41)
Albumin: 3.5 g/dL (ref 3.5–5.0)
Alkaline Phosphatase: 151 U/L — ABNORMAL HIGH (ref 38–126)
Anion gap: 18 — ABNORMAL HIGH (ref 5–15)
BUN: 30 mg/dL — ABNORMAL HIGH (ref 8–23)
CO2: 20 mmol/L — ABNORMAL LOW (ref 22–32)
Calcium: 9.4 mg/dL (ref 8.9–10.3)
Chloride: 97 mmol/L — ABNORMAL LOW (ref 98–111)
Creatinine, Ser: 1.61 mg/dL — ABNORMAL HIGH (ref 0.44–1.00)
GFR, Estimated: 33 mL/min — ABNORMAL LOW (ref >60)
Glucose, Bld: 141 mg/dL — ABNORMAL HIGH (ref 70–99)
Potassium: 4 mmol/L (ref 3.5–5.1)
Sodium: 135 mmol/L (ref 135–145)
Total Bilirubin: 6.2 mg/dL — ABNORMAL HIGH (ref 0.3–1.2)
Total Protein: 8.2 g/dL — ABNORMAL HIGH (ref 6.5–8.1)

## 2020-07-15 LAB — CBC WITH DIFFERENTIAL/PLATELET
Abs Immature Granulocytes: 0.02 10*3/uL (ref 0.00–0.07)
Basophils Absolute: 0.1 10*3/uL (ref 0.0–0.1)
Basophils Relative: 1 %
Eosinophils Absolute: 0.2 10*3/uL (ref 0.0–0.5)
Eosinophils Relative: 2 %
HCT: 39.8 % (ref 36.0–46.0)
Hemoglobin: 13.1 g/dL (ref 12.0–15.0)
Immature Granulocytes: 0 %
Lymphocytes Relative: 23 %
Lymphs Abs: 2.3 10*3/uL (ref 0.7–4.0)
MCH: 27.2 pg (ref 26.0–34.0)
MCHC: 32.9 g/dL (ref 30.0–36.0)
MCV: 82.6 fL (ref 80.0–100.0)
Monocytes Absolute: 0.8 10*3/uL (ref 0.1–1.0)
Monocytes Relative: 8 %
Neutro Abs: 6.5 10*3/uL (ref 1.7–7.7)
Neutrophils Relative %: 66 %
Platelets: 225 10*3/uL (ref 150–400)
RBC: 4.82 MIL/uL (ref 3.87–5.11)
RDW: 15.8 % — ABNORMAL HIGH (ref 11.5–15.5)
WBC: 9.8 10*3/uL (ref 4.0–10.5)
nRBC: 0 % (ref 0.0–0.2)

## 2020-07-15 MED ORDER — BLOOD GLUCOSE MONITOR KIT
PACK | 0 refills | Status: DC
Start: 2020-07-15 — End: 2020-07-31

## 2020-07-15 MED ORDER — SODIUM CHLORIDE 0.9 % IV SOLN
INTRAVENOUS | Status: DC
  Filled 2020-07-15 (×2): qty 250

## 2020-07-15 NOTE — Progress Notes (Signed)
Concerns about loss of appetite within the last week. States she is at abut 25% normal. Just got boosts but has not used them yet. Plans to start drinking them to help.

## 2020-07-15 NOTE — Assessment & Plan Note (Addendum)
#  Pancreatic adenocarcinoma locally advanced/invading duodenum; 1 cm peripancreatic lymphadenopathy on MRI-3.5 cm ill-defined hypovascular mass in the pancreatic head,   No vascular invasion noted.  April 2022-PET scan shows no evidence of distant metastatic disease [incidental/goiter see below]  #Await surgical evaluation for upfront surgery at [facility] next week 4/27.  Discussed with Dr. Madie Reno.  # If patient is not a candidate for surgery for any reason-would recommend neoadjuvant/palliative chemotherapy [gem-Abarxane]-after evaluation at [facility].  #Obstructive jaundice elevated LFTs bilirubin-3.8; worsening bilirubin today 4.6.-S/p biliary drain- TB 6.4; disucussed IR-drain in place as per recent imaging.  Suspect labs lagging behind.   #Hypotension/acute renal failure-creatinine 1.6 recommend stop amlodipine/atenolol [given low blood pressures]-we will start when the blood pressure improves.  Today IV fluids 1 L; blood pressure improved to 361 systolic.  # DM- [acute renal failure]HOLD metformin; check BG [scipt for glucometer]; recommend stop metformin; until further directions.  Check blood glucose fasting in the morning; and at night.  Blood sugars are greater than 250-call us for medication/instructions  # DISPOSITION # IVfs over 1 hour today # IVFs on 4/25-1 hour #  Follow up  In 1 week- labs- cbc/bmp; possible IVFs over 1 hour- Dr.B

## 2020-07-15 NOTE — Progress Notes (Signed)
[place] NOTE  Patient Care Team: Delsa Grana, PA-C as PCP - General (Family Medicine) Loney Loh, Michaell Cowing, MD as Referring Physician (Endocrinology) Olean Ree, MD as Consulting Physician (General Surgery) Clent Jacks, RN as Oncology Nurse Navigator  CHIEF COMPLAINTS/PURPOSE OF CONSULTATION: Pancreatic cancer    Oncology History Overview Note  # FEB 28th 2022-  Ill-defined hypodense area seen within the pancreatic head which appears to abut the duodenum and adjacent to coarse calcifications. 2. Duodenal diverticula with focal wall thickening/narrowing which; MRI Abdomen- 3.5 cm ill-defined hypovascular mass in the pancreatic head, highly suspicious for pancreatic adenocarcinoma. This causes mild diffuse biliary and pancreatic ductal dilatation. 1 cm peripancreatic lymph node, suspicious for metastatic disease.  # EUS/ERCP-attempted at [facility]]; Dr.Spaete-unable to traverse duodenal stricture; duodenal biopsy positive adenocarcinoma.   could be due to mild duodenitis. 5. 8 mm pulmonary nodule within the right lung base. N # Goiter [Dr.Clemmons; [facility]: now with [facility]]    Primary pancreatic cancer ([place])  07/05/2020 Initial Diagnosis   Primary pancreatic cancer (H[facility])      HISTORY OF PRESENTING ILLNESS:  Tracy Bolton 80 y.o.  female with newly diagnosed pancreatic cancer/locally advanced; obstructive jaundice is here for a follow-up to review the results of her PET scan.  In the interim patient was admitted to hospital for biliary drain; s/p IR intervention.   Denies any nausea vomiting.  Denies any worsening abdominal pain.  Patient is currently awaiting appointment at [facility] next week-surgery.  Review of Systems  Constitutional: Negative for chills, diaphoresis, fever and malaise/fatigue.  HENT: Negative for nosebleeds and sore throat.   Eyes: Negative for double vision.  Respiratory: Negative for cough, hemoptysis, sputum  production, shortness of breath and wheezing.   Cardiovascular: Negative for chest pain, palpitations, orthopnea and leg swelling.  Gastrointestinal: Negative for blood in stool, constipation, diarrhea, heartburn, melena, nausea and vomiting.  Genitourinary: Negative for dysuria, frequency and urgency.  Musculoskeletal: Positive for back pain and joint pain.  Skin: Negative.  Negative for itching and rash.  Neurological: Negative for dizziness, tingling, focal weakness, weakness and headaches.  Endo/Heme/Allergies: Does not bruise/bleed easily.  Psychiatric/Behavioral: Negative for depression. The patient is not nervous/anxious and does not have insomnia.      MEDICAL HISTORY:  Past Medical History:  Diagnosis Date  . Arthritis   . Gout, chronic   . Hyperlipidemia   . Hypertension   . Osteoporosis   . Thyroid disease     SURGICAL HISTORY: Past Surgical History:  Procedure Laterality Date  . CATARACT EXTRACTION, BILATERAL  02/2018  . COLONOSCOPY  2016  . IR BILIARY DRAIN PLACEMENT WITH CHOLANGIOGRAM  07/08/2020  . LUNG SURGERY Right 2004   biopsy normal    SOCIAL HISTORY: Social History   Socioeconomic History  . Marital status: Single    Spouse name: Not on file  . Number of children: 2  . Years of education: Not on file  . Highest education level: Some college, no degree  Occupational History  . Occupation: retired  Tobacco Use  . Smoking status: Never Smoker  . Smokeless tobacco: Never Used  Vaping Use  . Vaping Use: Never used  Substance and Sexual Activity  . Alcohol use: No    Alcohol/week: 0.0 standard drinks  . Drug use: No  . Sexual activity: Not Currently  Other Topics Concern  . Not on file  Social History Narrative   Pt lives alone. With [employer]- med alert [closely -daughter, salibury]. Never  smoked; no alcohol. Worked for [employer]; retd [2376]    Social Determinants of Radio broadcast assistant Strain: Low Risk   . Difficulty of Paying  Living Expenses: Not hard at all  Food Insecurity: No Food Insecurity  . Worried About Charity fundraiser in the Last Year: Never true  . Ran Out of Food in the Last Year: Never true  Transportation Needs: No Transportation Needs  . Lack of Transportation (Medical): No  . Lack of Transportation (Non-Medical): No  Physical Activity: Insufficiently Active  . Days of Exercise per Week: 7 days  . Minutes of Exercise per Session: 20 min  Stress: No Stress Concern Present  . Feeling of Stress : Not at all  Social Connections: Moderately Integrated  . Frequency of Communication with Friends and Family: More than three times a week  . Frequency of Social Gatherings with Friends and Family: More than three times a week  . Attends Religious Services: More than 4 times per year  . Active Member of Clubs or Organizations: Yes  . Attends Archivist Meetings: More than 4 times per year  . Marital Status: Never married  Intimate Partner Violence: Not At Risk  . Fear of Current or Ex-Partner: No  . Emotionally Abused: No  . Physically Abused: No  . Sexually Abused: No    FAMILY HISTORY: Family History  Problem Relation Age of Onset  . Bone cancer Father 31       not biological father  . Thyroid disease Sister   . Cancer Mother 5       brain tumor  . Breast cancer Neg Hx     ALLERGIES:  has No Known Allergies.  MEDICATIONS:  Current Outpatient Medications  Medication Sig Dispense Refill  . allopurinol (ZYLOPRIM) 100 MG tablet TAKE 1 TABLET BY MOUTH EVERY DAY 90 tablet 3  . amLODipine (NORVASC) 10 MG tablet TAKE 1 TABLET BY MOUTH EVERY DAY 90 tablet 3  . aspirin 81 MG tablet Take 81 mg by mouth.    Marland Kitchen atenolol (TENORMIN) 50 MG tablet TAKE 1 TABLET BY MOUTH EVERY DAY 90 tablet 3  . atorvastatin (LIPITOR) 10 MG tablet TAKE 1 TABLET BY MOUTH EVERYDAY AT BEDTIME 90 tablet 3  . blood glucose meter kit and supplies KIT Dispense based on patient and insurance preference. Use up to  four times daily as directed. 1 each 0  . lidocaine-prilocaine (EMLA) cream Apply 30 -45 mins prior to port access. 30 g 0  . metFORMIN (GLUCOPHAGE-XR) 500 MG 24 hr tablet TAKE 2 TABLETS BY MOUTH EVERY DAY 180 tablet 3  . nystatin (MYCOSTATIN/NYSTOP) powder APPLY TO AFFECTED AREA OF SKIN 3X A DAY AS NEEDED IN A SUFFICIENT AMOUNT 30 g 2  . oxyCODONE (OXY IR/ROXICODONE) 5 MG immediate release tablet Take 1 tablet (5 mg total) by mouth every 6 (six) hours as needed for severe pain or breakthrough pain. 30 tablet 0  . pantoprazole (PROTONIX) 40 MG tablet Take 1 tablet (40 mg total) by mouth daily. 30 tablet 3  . prochlorperazine (COMPAZINE) 10 MG tablet Take 1 tablet (10 mg total) by mouth every 6 (six) hours as needed for nausea or vomiting. 40 tablet 1  . senna (SENOKOT) 8.6 MG TABS tablet Take 1 tablet (8.6 mg total) by mouth at bedtime as needed for mild constipation. 120 tablet 0   No current facility-administered medications for this visit.   Facility-Administered Medications Ordered in Other Visits  Medication Dose Route  Frequency Provider Last Rate Last Admin  . 0.9 %  sodium chloride infusion   Intravenous Continuous Cammie Sickle, MD   Stopped at 07/15/20 1315      .  PHYSICAL EXAMINATION: ECOG PERFORMANCE STATUS: 0 - Asymptomatic  Vitals:   07/15/20 1056  BP: (!) 72/47  Pulse: 68  Resp: 18  Temp: (!) 96.2 F (35.7 C)  SpO2: 99%   Filed Weights   07/15/20 1056  Weight: 191 lb 6.4 oz (86.8 kg)    Physical Exam Constitutional:      Comments: Patient is accompanied by daughter.  Walking with a cane.   HENT:     Head: Normocephalic and atraumatic.     Mouth/Throat:     Pharynx: No oropharyngeal exudate.  Eyes:     Pupils: Pupils are equal, round, and reactive to light.  Neck:     Comments: Right-sided neck mass chronic Cardiovascular:     Rate and Rhythm: Normal rate and regular rhythm.  Pulmonary:     Effort: Pulmonary effort is normal. No respiratory  distress.     Breath sounds: Normal breath sounds. No wheezing.  Abdominal:     General: Bowel sounds are normal. There is no distension.     Palpations: Abdomen is soft. There is no mass.     Tenderness: There is no abdominal tenderness. There is no guarding or rebound.  Musculoskeletal:        General: No tenderness. Normal range of motion.     Cervical back: Normal range of motion and neck supple.  Skin:    General: Skin is warm.  Neurological:     Mental Status: She is alert and oriented to person, place, and time.  Psychiatric:        Mood and Affect: Affect normal.    LABORATORY DATA:  I have reviewed the data as listed Lab Results  Component Value Date   WBC 9.8 07/15/2020   HGB 13.1 07/15/2020   HCT 39.8 07/15/2020   MCV 82.6 07/15/2020   PLT 225 07/15/2020   Recent Labs    10/06/19 1056 04/08/20 1139 05/22/20 2212 07/08/20 0517 07/09/20 0405 07/15/20 1037  NA 140 140   < > 141 138 135  K 4.6 4.3   < > 3.9 3.6 4.0  CL 102 103   < > 104 101 97*  CO2 26 24   < > 24 25 20*  GLUCOSE 150* 156*   < > 99 103* 141*  BUN 20 22   < > 13 10 30*  CREATININE 1.17* 0.96*   < > 1.04* 0.85 1.61*  CALCIUM 10.0 10.0   < > 9.1 9.0 9.4  GFRNONAA 45* 57*   < > 55* >60 33*  GFRAA 52* 66  --   --   --   --   PROT 7.2 7.1   < > 6.8 7.1 8.2*  ALBUMIN  --   --    < > 3.2* 3.2* 3.5  AST 21 17   < > 192* 227* 236*  ALT 15 9   < > 104* 119* 109*  ALKPHOS  --   --    < > 167* 196* 151*  BILITOT 0.9 1.0   < > 5.6* 4.9* 6.2*   < > = values in this interval not displayed.    RADIOGRAPHIC STUDIES: I have personally reviewed the radiological images as listed and agreed with the findings in the report. MR Abdomen W Wo Contrast  Result Date: 06/27/2020 CLINICAL DATA:  Abdominal pain.  Pancreatic lesion on recent CT. EXAM: MRI ABDOMEN WITHOUT AND WITH CONTRAST TECHNIQUE: Multiplanar multisequence MR imaging of the abdomen was performed both before and after the administration of  intravenous contrast. CONTRAST:  52m GADAVIST GADOBUTROL 1 MMOL/ML IV SOLN COMPARISON:  CT on 05/23/2020 FINDINGS: Lower chest: No acute findings. Hepatobiliary: No hepatic masses identified. A few tiny sub-cm cysts are noted in the left lobe. Gallbladder is dilated and contains numerous tiny less than 1 cm gallstones. No evidence of acute cholecystitis. Diffuse biliary ductal dilatation is seen, with common bile duct measuring 10 mm. Stricture of the distal common bile duct is seen at the level the pancreatic head. Pancreas: Mild pancreatic ductal dilatation is seen to the level of the pancreatic head. A poorly defined hypovascular mass is seen in the pancreatic head which measures 3.5 x 3.2 cm on image 57/22. This shows no evidence of vascular encasement. No evidence peripancreatic inflammatory changes or fluid collections. Spleen:  Within normal limits in size and appearance. Adrenals/Urinary Tract: No masses identified. A few tiny sub-cm left renal cysts are noted. No evidence of hydronephrosis. Stomach/Bowel: Visualized portion unremarkable. Vascular/Lymphatic: 1 cm peripancreatic lymph node seen along the anterior and superior margin of the pancreatic head, suspicious for metastatic disease. No other pathologically enlarged lymph nodes identified. No abdominal aortic aneurysm. Other:  None. Musculoskeletal:  No suspicious bone lesions identified. IMPRESSION: 3.5 cm ill-defined hypovascular mass in the pancreatic head, highly suspicious for pancreatic adenocarcinoma. This causes mild diffuse biliary and pancreatic ductal dilatation. 1 cm peripancreatic lymph node, suspicious for metastatic disease. Distended gallbladder with numerous small gallstones. No radiographic evidence of acute cholecystitis. These results will be called to the ordering clinician or representative by the Radiologist Assistant, and communication documented in the PACS or [employer] Electronically Signed   By: JMarlaine HindM.D.    On: 06/27/2020 15:27   [place]ra[place]  Result Date: 07/08/2020 INDICATION: Jaundice, biliary obstruction, pancreas cancer, successful ERCP EXAM: ULTRASOUND FLUOROSCOPIC LEFT INTERNAL EXTERNAL 10 FRENCH BILIARY DRAIN MEDICATIONS: 2 g Mefoxin; The antibiotic was administered within an appropriate time frame prior to the initiation of the procedure. ANESTHESIA/SEDATION: Moderate (conscious) sedation was employed during this procedure. A total of Versed 2.0 mg and Fentanyl 150 mcg was administered intravenously. Moderate Sedation Time: 32 minutes. The patient's level of consciousness and vital signs were monitored continuously by radiology nursing throughout the procedure under my direct supervision. FLUOROSCOPY TIME:  Fluoroscopy Time: 5 minutes 12 seconds (89 mGy). COMPLICATIONS: None immediate. PROCEDURE: Informed written consent was obtained from the patient after a thorough discussion of the procedural risks, benefits and alternatives. All questions were addressed. Maximal Sterile Barrier Technique was utilized including caps, mask, sterile gowns, sterile gloves, sterile drape, hand hygiene and skin antiseptic. A timeout was performed prior to the initiation of the procedure. Previous imaging reviewed. Preliminary ultrasound performed of the liver. Left hepatic ducts were localized from a subxiphoid approach. Overlying skin marked. Under sterile conditions and local anesthesia, a 21 gauge 15 cm access was advanced percutaneously under ultrasound into a peripheral left hepatic duct. Needle position confirmed with ultrasound and return of clear bile. Ultrasound images obtained for documentation. Contrast injection performed for initial cholangiogram. This demonstrates diffuse biliary dilatation and distal obstruction. 018 guidewire advanced into the biliary tree followed by the transitional dilator set. Amplatz guidewire advanced followed by a Kumpe catheter. Kumpe catheter advanced into the duodenum. Contrast  injection confirms position in the duodenum. Over  an Amplatz guidewire, tract dilatation performed to insert a 10 [place] internal external biliary drain. Retention loop positioned in the duodenum. Contrast injection confirms position. Images obtained for documentation. Cholangiogram confirms biliary obstruction. Cystic duct is patent with numerous gallstones noted. IMPRESSION: Successful 10 French left internal external biliary drain for distal CBD obstruction. Electronically Signed   By: Jerilynn Mages.  Shick M.D.   On: 07/08/2020 12:19   NM PET Image Initial (PI) Skull Base To Thigh  Addendum Date: 07/14/2020   ADDENDUM REPORT: 07/14/2020 16:13 ADDENDUM: Note that there is some mild to moderate airway narrowing at the thoracic inlet. The patient has a chronic mass in this area. Would still however correlate with any respiratory symptoms given the marked displacement of the trachea. For reference the trachea in the upper chest is 15 mm and at the thoracic inlet is 9 mm greatest width. Note also that nodular areas in the chest are without increased metabolic activity. These results will be called to the ordering clinician or representative by the Radiologist Assistant, and communication documented in the PACS or [employer]. Electronically Signed   By: Zetta Bills M.D.   On: 07/14/2020 16:13   Result Date: 07/14/2020 CLINICAL DATA:  Initial treatment strategy for pancreatic cancer. EXAM: NUCLEAR MEDICINE PET SKULL BASE TO THIGH TECHNIQUE: 10.2 mCi F-18 FDG was injected intravenously. Full-ring PET imaging was performed from the skull base to thigh after the radiotracer. CT data was obtained and used for attenuation correction and anatomic localization. Fasting blood glucose: CT of the abdomen and pelvis from May 23, 2020 mg/dl COMPARISON:  Abdominal CT from May 23, 2020 FINDINGS: Mediastinal blood pool activity: SUV max 3.7 Liver activity: SUV max NA NECK: Large RIGHT neck mass 9.6 x 7.8 cm with areas of  increased FDG uptake with maximum SUV of 5.9. There is tracheal deviation that is been described on multiple prior chest x-rays dating back to 2006. No discrete adenopathy is present at the thoracic inlet or in the neck. Normal anatomy is highly distorted and the airway is narrowed secondary to mass effect from this thoracic inlet/neck mass that extends from the submandibular region into the upper chest. Incidental CT findings: none CHEST: No hypermetabolic mediastinal or hilar nodes. Scattered pulmonary nodules, described below show no increased metabolic activity above blood pool largest in the RIGHT chest with a maximum SUV of 1.2 Incidental CT findings: Calcified atheromatous plaque of the thoracic aorta. Nonaneurysmal. Mildly engorged central pulmonary vasculature 3.2 cm. Mitral annular calcifications. Normal heart size. No pericardial effusion. No adenopathy by size criteria in the chest low-density area measuring water density in the mediastinum may represent a small pericardial cyst seen on image 62 and 63 of series 3 and shows no increased FDG uptake. Basilar atelectasis. Areas of nodularity are present, 1 area along the major fissure in the RIGHT chest measures 10 x 7 mm. Apical pleural scarring. Ill-defined nodule in the LEFT upper lobe measuring 4 mm. Another in the LEFT upper lobe measuring 4 mm on images 53 and 57 respectively. Pulmonary emphysema. ABDOMEN/PELVIS: Large hypermetabolic mass in the pancreatic-duodenal groove with a maximum SUV of 30 corresponding to the abnormality seen on prior images, difficult to separate from duodenum on the current exam not grossly changed compared to the recent MRI evaluation Heterogeneous hepatic activity without discrete lesion. Increased metabolic uptake along the patient's biliary drainage catheter passing through the LEFT hepatic lobe. Lymph node adjacent to the pancreatic head measuring approximately 12 mm with increased metabolic activity (image 062,  series  3. Metabolic activity is difficult to separate from the adjacent pancreatic mass maximum SUV estimated to be in the range of 6.5. Hepatic gastric/celiac nodes, a cluster of lymph nodes seen on image 114 of series 3 also with some dilated vascular channels in this area metabolic activity at approximately 3.5 maximum SUV, largest node in this area approximately 6 mm. No retroperitoneal adenopathy below the renal veins. Diffuse bowel activity Incidental CT findings: Cholelithiasis. Numerous gallstones in the gallbladder. Spleen, adrenal glands and kidneys without acute process. No perinephric stranding. Urinary bladder collapsed. No acute gastrointestinal process. Normal appendix. Uterus and adnexa unremarkable by CT. Small fat containing umbilical hernia. SKELETON: No focal hypermetabolic activity to suggest skeletal metastasis. 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 Spinal degenerative changes. IMPRESSION: Pancreatic mass with local adenopathy now with biliary drain in place. Signs of peripancreatic nodal enlargement with smaller nodes in the gastrohepatic recess. No definitive evidence of distant metastatic disease. Areas of ill-defined nodularity in the chest most notably in the RIGHT mid chest along the minor fissure, of uncertain significance. The dominant area shows tubular morphology. Close attention on follow-up is suggested, consider short interval follow-up for further evaluation. Large mediastinal/lower lobe neck mass may represent a large border and is been commented on on multiple prior chest x-rays. No comparison cross-sectional imaging is available. This area shows increased FDG uptake. Focused ultrasound and biopsy is suggested. Recommend thyroid US and biopsy yes sarcasm just cleanup this 1 finger and there is(ref: J Am Coll Radiol. 2015 Feb;12(2): 143-50). Electronically Signed: By: Zetta Bills M.D. On: 07/14/2020 15:53   IR BILIARY DRAIN PLACEMENT WITH  CHOLANGIOGRAM  Result Date: 07/08/2020 INDICATION: Jaundice, biliary obstruction, pancreas cancer, successful ERCP EXAM: ULTRASOUND FLUOROSCOPIC LEFT INTERNAL EXTERNAL 10 FRENCH BILIARY DRAIN MEDICATIONS: 2 g Mefoxin; The antibiotic was administered within an appropriate time frame prior to the initiation of the procedure. ANESTHESIA/SEDATION: Moderate (conscious) sedation was employed during this procedure. A total of Versed 2.0 mg and Fentanyl 150 mcg was administered intravenously. Moderate Sedation Time: 32 minutes. The patient's level of consciousness and vital signs were monitored continuously by radiology nursing throughout the procedure under my direct supervision. FLUOROSCOPY TIME:  Fluoroscopy Time: 5 minutes 12 seconds (89 mGy). COMPLICATIONS: None immediate. PROCEDURE: Informed written consent was obtained from the patient after a thorough discussion of the procedural risks, benefits and alternatives. All questions were addressed. Maximal Sterile Barrier Technique was utilized including caps, mask, sterile gowns, sterile gloves, sterile drape, hand hygiene and skin antiseptic. A timeout was performed prior to the initiation of the procedure. Previous imaging reviewed. Preliminary ultrasound performed of the liver. Left hepatic ducts were localized from a subxiphoid approach. Overlying skin marked. Under sterile conditions and local anesthesia, a 21 gauge 15 cm access was advanced percutaneously under ultrasound into a peripheral left hepatic duct. Needle position confirmed with ultrasound and return of clear bile. Ultrasound images obtained for documentation. Contrast injection performed for initial cholangiogram. This demonstrates diffuse biliary dilatation and distal obstruction. 018 guidewire advanced into the biliary tree followed by the transitional dilator set. Amplatz guidewire advanced followed by a Kumpe catheter. Kumpe catheter advanced into the duodenum. Contrast injection confirms position  in the duodenum. Over an Amplatz guidewire, tract dilatation performed to insert a 10 [place] internal external biliary drain. Retention loop positioned in the duodenum. Contrast injection confirms position. Images obtained for documentation. Cholangiogram confirms biliary obstruction. Cystic duct is patent with numerous gallstones noted. IMPRESSION: Successful 10 French left internal  external biliary drain for distal CBD obstruction. Electronically Signed   By: Jerilynn Mages.  Shick M.D.   On: 07/08/2020 12:19    ASSESSMENT & PLAN:   Primary pancreatic cancer ([place]) #Pancreatic adenocarcinoma locally advanced/invading duodenum; 1 cm peripancreatic lymphadenopathy on MRI-3.5 cm ill-defined hypovascular mass in the pancreatic head,   No vascular invasion noted.  April 2022-PET scan shows no evidence of distant metastatic disease [incidental/goiter see below]  #Await surgical evaluation for upfront surgery at [facility] next week 4/27.  Discussed with Dr. Madie Reno.  # If patient is not a candidate for surgery for any reason-would recommend neoadjuvant/palliative chemotherapy [gem-Abarxane]-after evaluation at [facility].  #Obstructive jaundice elevated LFTs bilirubin-3.8; worsening bilirubin today 4.6.-S/p biliary drain- TB 6.4; disucussed IR-drain in place as per recent imaging.  Suspect labs lagging behind.   #Hypotension/acute renal failure-creatinine 1.6 recommend stop amlodipine/atenolol [given low blood pressures]-we will start when the blood pressure improves.  Today IV fluids 1 L; blood pressure improved to 005 systolic.  # DM- [acute renal failure]HOLD metformin; check BG [scipt for glucometer]; recommend stop metformin; until further directions.  Check blood glucose fasting in the morning; and at night.  Blood sugars are greater than 250-call us for medication/instructions  # DISPOSITION # IVfs over 1 hour today # IVFs on 4/25-1 hour #  Follow up  In 1 week- labs- cbc/bmp; possible IVFs over 1 hour-  Dr.B   All questions were answered. The patient knows to call the clinic with any problems, questions or concerns.    Cammie Sickle, MD 07/15/2020 1:42 PM

## 2020-07-15 NOTE — Patient Instructions (Addendum)
#  Stop taking your amlodipine/atenolol [given low blood pressures]-we will start when the blood pressure improves  #Stop metformin; until further directions.  Check blood glucose fasting in the morning; and at night.  Blood sugars are greater than 250-call us for medication/instructions

## 2020-07-18 ENCOUNTER — Other Ambulatory Visit: Payer: Self-pay

## 2020-07-18 ENCOUNTER — Inpatient Hospital Stay: Payer: Medicare PPO

## 2020-07-18 ENCOUNTER — Telehealth: Payer: Self-pay | Admitting: Internal Medicine

## 2020-07-18 VITALS — BP 99/67 | HR 81 | Temp 96.6°F

## 2020-07-18 DIAGNOSIS — K8689 Other specified diseases of pancreas: Secondary | ICD-10-CM | POA: Diagnosis not present

## 2020-07-18 DIAGNOSIS — C259 Malignant neoplasm of pancreas, unspecified: Secondary | ICD-10-CM

## 2020-07-18 MED ORDER — SODIUM CHLORIDE 0.9 % IV SOLN
Freq: Once | INTRAVENOUS | Status: AC
Start: 2020-07-18 — End: 2020-07-18
  Filled 2020-07-18: qty 250

## 2020-07-18 NOTE — Progress Notes (Signed)
Pt receivied 1 Liter of NS per PIV. Daughter, [name], informed me at time of pt's discharge that they have not heard from Dr Raul Del office at [facility]. They have been waiting for an appt.Meda Coffee notified Heather RN from Dr [employer] team. Discharged to home. Stable.

## 2020-07-18 NOTE — Telephone Encounter (Signed)
Called and spoke with Tracy Bolton. Verified phone number for [facility] to use for scheduling. Contacted Buddy Duty (whom has previously attempted to contact Tracy Bolton) at [facility] for scheduling. She states they will be scheduling her in [place] and she will get them to reach out to her for scheduling now. Verified we all have same phone number.

## 2020-07-18 NOTE — Telephone Encounter (Signed)
On 4/25-I discussed with Dr.Kevin Manuella Ghazi at [facility]; patient finally has appointment on 4/27. Drue Dun confirmed with pt/family.

## 2020-07-19 LAB — CANCER ANTIGEN 19-9: CA 19-9: 83 U/mL — ABNORMAL HIGH (ref 0–35)

## 2020-07-20 ENCOUNTER — Telehealth: Payer: Self-pay | Admitting: Internal Medicine

## 2020-07-20 NOTE — Telephone Encounter (Signed)
On 4/28-discussed with Dr. 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  Recommend neoadjuvant chemotherapy prior evaluation resection.  If concern for clinical duodenal obstruction from tumor-recommend duodenal stent at [facility]/Dr.Shah.  Await follow-up as planned this week.   GB

## 2020-07-21 NOTE — Telephone Encounter (Signed)
Dr. Jacinto Reap- patient does not have port a cath placed yet. Do you want me to contact IR dept to see if port can still be placed asap?

## 2020-07-21 NOTE — Telephone Encounter (Signed)
Msg sent to [facility] in specialty scheduling to schedule IR port placement asap

## 2020-07-21 NOTE — Telephone Encounter (Signed)
I agree; pt needs a port placed. Please make the referral to IR. Thanks GB

## 2020-07-22 ENCOUNTER — Other Ambulatory Visit: Payer: Self-pay

## 2020-07-22 ENCOUNTER — Inpatient Hospital Stay: Payer: Medicare PPO

## 2020-07-22 ENCOUNTER — Encounter: Payer: Self-pay | Admitting: Internal Medicine

## 2020-07-22 ENCOUNTER — Inpatient Hospital Stay (HOSPITAL_BASED_OUTPATIENT_CLINIC_OR_DEPARTMENT_OTHER): Payer: Medicare PPO | Admitting: Internal Medicine

## 2020-07-22 VITALS — BP 106/68 | HR 65 | Temp 96.9°F | Resp 17

## 2020-07-22 VITALS — BP 91/65 | HR 92 | Temp 97.6°F | Resp 16 | Ht 63.0 in | Wt 185.0 lb

## 2020-07-22 DIAGNOSIS — K8689 Other specified diseases of pancreas: Secondary | ICD-10-CM

## 2020-07-22 DIAGNOSIS — K862 Cyst of pancreas: Secondary | ICD-10-CM

## 2020-07-22 DIAGNOSIS — C259 Malignant neoplasm of pancreas, unspecified: Secondary | ICD-10-CM

## 2020-07-22 LAB — CBC WITH DIFFERENTIAL/PLATELET
Abs Immature Granulocytes: 0.02 10*3/uL (ref 0.00–0.07)
Basophils Absolute: 0.1 10*3/uL (ref 0.0–0.1)
Basophils Relative: 1 %
Eosinophils Absolute: 0.2 10*3/uL (ref 0.0–0.5)
Eosinophils Relative: 2 %
HCT: 39.7 % (ref 36.0–46.0)
Hemoglobin: 12.9 g/dL (ref 12.0–15.0)
Immature Granulocytes: 0 %
Lymphocytes Relative: 26 %
Lymphs Abs: 2.4 10*3/uL (ref 0.7–4.0)
MCH: 27.4 pg (ref 26.0–34.0)
MCHC: 32.5 g/dL (ref 30.0–36.0)
MCV: 84.3 fL (ref 80.0–100.0)
Monocytes Absolute: 0.8 10*3/uL (ref 0.1–1.0)
Monocytes Relative: 9 %
Neutro Abs: 5.8 10*3/uL (ref 1.7–7.7)
Neutrophils Relative %: 62 %
Platelets: 280 10*3/uL (ref 150–400)
RBC: 4.71 MIL/uL (ref 3.87–5.11)
RDW: 16.7 % — ABNORMAL HIGH (ref 11.5–15.5)
WBC: 9.2 10*3/uL (ref 4.0–10.5)
nRBC: 0 % (ref 0.0–0.2)

## 2020-07-22 LAB — HEPATIC FUNCTION PANEL
ALT: 123 U/L — ABNORMAL HIGH (ref 0–44)
AST: 251 U/L — ABNORMAL HIGH (ref 15–41)
Albumin: 3.5 g/dL (ref 3.5–5.0)
Alkaline Phosphatase: 137 U/L — ABNORMAL HIGH (ref 38–126)
Bilirubin, Direct: 3.1 mg/dL — ABNORMAL HIGH (ref 0.0–0.2)
Indirect Bilirubin: 3.4 mg/dL — ABNORMAL HIGH (ref 0.3–0.9)
Total Bilirubin: 6.5 mg/dL — ABNORMAL HIGH (ref 0.3–1.2)
Total Protein: 8.5 g/dL — ABNORMAL HIGH (ref 6.5–8.1)

## 2020-07-22 LAB — BASIC METABOLIC PANEL
Anion gap: 18 — ABNORMAL HIGH (ref 5–15)
BUN: 21 mg/dL (ref 8–23)
CO2: 21 mmol/L — ABNORMAL LOW (ref 22–32)
Calcium: 10 mg/dL (ref 8.9–10.3)
Chloride: 100 mmol/L (ref 98–111)
Creatinine, Ser: 1.32 mg/dL — ABNORMAL HIGH (ref 0.44–1.00)
GFR, Estimated: 42 mL/min — ABNORMAL LOW (ref >60)
Glucose, Bld: 132 mg/dL — ABNORMAL HIGH (ref 70–99)
Potassium: 4 mmol/L (ref 3.5–5.1)
Sodium: 139 mmol/L (ref 135–145)

## 2020-07-22 MED ORDER — SODIUM CHLORIDE 0.9 % IV SOLN
Freq: Once | INTRAVENOUS | Status: AC
Start: 2020-07-22 — End: 2020-07-22
  Filled 2020-07-22: qty 250

## 2020-07-22 MED ORDER — ONDANSETRON HCL 4 MG/2ML IJ SOLN
8.0000 mg | Freq: Once | INTRAMUSCULAR | Status: AC
  Administered 2020-07-22: 8 mg via INTRAVENOUS
  Filled 2020-07-22: qty 4

## 2020-07-22 MED ORDER — SODIUM CHLORIDE 0.9 % IV SOLN: Freq: Once | INTRAVENOUS | Status: DC

## 2020-07-22 NOTE — Progress Notes (Signed)
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     A cycle is every 28 days:     Nab-paclitaxel (protein bound)      Gemcitabine   **Always confirm dose/schedule in your pharmacy ordering system**  Patient Characteristics: Preoperative (Clinical Staging), Borderline Resectable, PS = 0,1, BRCA1/2 and PALB2 Mutation Absent/Unknown Therapeutic Status: Preoperative (Clinical Staging) AJCC T Category: cT4 AJCC N Category: cN1 Resectability Status: Borderline Resectable AJCC M Category: cM0 AJCC 8 Stage Grouping: III ECOG Performance Status: 1 BRCA1/2 Mutation Status: Awaiting Test Results PALB2 Mutation Status: Awaiting Test Results Intent of Therapy: Curative Intent, Discussed with Patient 

## 2020-07-22 NOTE — Progress Notes (Signed)
[name] NOTE  Patient Care Team: Tracy Grana, PA-C as PCP - General (Family Medicine) Tracy Bolton, Tracy Cowing, MD as Referring Physician (Endocrinology) Tracy Ree, MD as Consulting Physician (General Surgery) Tracy Jacks, RN as Oncology Nurse Navigator  CHIEF COMPLAINTS/PURPOSE OF CONSULTATION: Pancreatic cancer    Oncology History Overview Note  # FEB 28th 2022-  Ill-defined hypodense area seen within the pancreatic head which appears to abut the duodenum and adjacent to coarse calcifications. 2. Duodenal diverticula with focal wall thickening/narrowing which; MRI Abdomen- 3.5 cm ill-defined hypovascular mass in the pancreatic head, highly suspicious for pancreatic adenocarcinoma. This causes mild diffuse biliary and pancreatic ductal dilatation. 1 cm peripancreatic lymph node, suspicious for metastatic disease.  # EUS/ERCP-attempted at [facility] 8th]; Tracy Bolton-unable to traverse duodenal stricture; duodenal biopsy positive adenocarcinoma.   could be due to mild duodenitis. 5. 8 mm pulmonary nodule within the right lung base. N # Goiter [Tracy Bolton; [facility]: now with [facility]]   Impression:  1. Pancreatic head mass with invasion of the adjacent duodenum. Mild  stranding inflammation may be postprocedural or reflect mild superimposed  pancreatitis.  2. Vascular involvement as above.  3. Prominent porta hepatis and peripancreatic lymph nodes, concerning for  nodal involvement. No other evidence of metastatic disease.  4. Endometrium abnormally thickened for age. Correlate with abnormal  bleeding and consider GYN workup as indicated.    #April 2022-pT4pN1- stage III pancreatic adenocarcinoma [s/p Eval at [facility], [place]   Primary pancreatic cancer ([place])  07/05/2020 Initial Diagnosis   Primary pancreatic cancer ([place])   07/22/2020 -  Chemotherapy    Patient is on Treatment Plan: PANCREATIC ABRAXANE / GEMCITABINE D1,8,15 Q28D         HISTORY  OF PRESENTING ILLNESS:  Tracy Bolton 80 y.o.  female with newly diagnosed pancreatic cancer/locally advanced; obstructive jaundice is here for a follow-up today with results/subsequent plan of care.  In the interim patient was evaluated at [facility] by Dr. Ines Bolton CT scan showed hepatic artery abutment.  Patient was felt not upfront surgical candidate.  Patient is here to discuss neoadjuvant chemotherapy.  Patient denies any nausea vomiting.  Denies any abdominal pain.  Denies chest pain.  She has lost weight.  Review of Systems  Constitutional: Positive for weight loss. Negative for chills, diaphoresis, fever and malaise/fatigue.  HENT: Negative for nosebleeds and sore throat.   Eyes: Negative for double vision.  Respiratory: Negative for cough, hemoptysis, sputum production, shortness of breath and wheezing.   Cardiovascular: Negative for chest pain, palpitations, orthopnea and leg swelling.  Gastrointestinal: Negative for blood in stool, constipation, diarrhea, heartburn, melena, nausea and vomiting.  Genitourinary: Negative for dysuria, frequency and urgency.  Musculoskeletal: Positive for back pain and joint pain.  Skin: Negative.  Negative for itching and rash.  Neurological: Negative for dizziness, tingling, focal weakness, weakness and headaches.  Endo/Heme/Allergies: Does not bruise/bleed easily.  Psychiatric/Behavioral: Negative for depression. The patient is not nervous/anxious and does not have insomnia.      MEDICAL HISTORY:  Past Medical History:  Diagnosis Date  . Arthritis   . Gout, chronic   . Hyperlipidemia   . Hypertension   . Osteoporosis   . Thyroid disease     SURGICAL HISTORY: Past Surgical History:  Procedure Laterality Date  . CATARACT EXTRACTION, BILATERAL  02/2018  . COLONOSCOPY  2016  . IR BILIARY DRAIN PLACEMENT WITH CHOLANGIOGRAM  07/08/2020  . LUNG SURGERY Right 2004   biopsy normal    SOCIAL HISTORY: Social  History   Socioeconomic History   . Marital status: Single    Spouse name: Not on file  . Number of children: 2  . Years of education: Not on file  . Highest education level: Some college, no degree  Occupational History  . Occupation: retired  Tobacco Use  . Smoking status: Never Smoker  . Smokeless tobacco: Never Used  Vaping Use  . Vaping Use: Never used  Substance and Sexual Activity  . Alcohol use: No    Alcohol/week: 0.0 standard drinks  . Drug use: No  . Sexual activity: Not Currently  Other Topics Concern  . Not on file  Social History Narrative   Pt lives alone. With [place]- med alert [closely -daughter, salibury]. Never smoked; no alcohol. Worked for [employer]; retd [4158]    Social Determinants of Radio broadcast assistant Strain: Low Risk   . Difficulty of Paying Living Expenses: Not hard at all  Food Insecurity: No Food Insecurity  . Worried About Charity fundraiser in the Last Year: Never true  . Ran Out of Food in the Last Year: Never true  Transportation Needs: No Transportation Needs  . Lack of Transportation (Medical): No  . Lack of Transportation (Non-Medical): No  Physical Activity: Insufficiently Active  . Days of Exercise per Week: 7 days  . Minutes of Exercise per Session: 20 min  Stress: No Stress Concern Present  . Feeling of Stress : Not at all  Social Connections: Moderately Integrated  . Frequency of Communication with Friends and Family: More than three times a week  . Frequency of Social Gatherings with Friends and Family: More than three times a week  . Attends Religious Services: More than 4 times per year  . Active Member of Clubs or Organizations: Yes  . Attends Archivist Meetings: More than 4 times per year  . Marital Status: Never married  Intimate Partner Violence: Not At Risk  . Fear of Current or Ex-Partner: No  . Emotionally Abused: No  . Physically Abused: No  . Sexually Abused: No    FAMILY HISTORY: Family History  Problem Relation Age of  Onset  . Bone cancer Father 67       not biological father  . Thyroid disease Sister   . Cancer Mother 87       brain tumor  . Breast cancer Neg Hx     ALLERGIES:  has No Known Allergies.  MEDICATIONS:  Current Outpatient Medications  Medication Sig Dispense Refill  . allopurinol (ZYLOPRIM) 100 MG tablet TAKE 1 TABLET BY MOUTH EVERY DAY 90 tablet 3  . amLODipine (NORVASC) 10 MG tablet TAKE 1 TABLET BY MOUTH EVERY DAY 90 tablet 3  . aspirin 81 MG tablet Take 81 mg by mouth.    Marland Kitchen atenolol (TENORMIN) 50 MG tablet TAKE 1 TABLET BY MOUTH EVERY DAY 90 tablet 3  . atorvastatin (LIPITOR) 10 MG tablet TAKE 1 TABLET BY MOUTH EVERYDAY AT BEDTIME 90 tablet 3  . blood glucose meter kit and supplies KIT Dispense based on patient and insurance preference. Use up to four times daily as directed. 1 each 0  . lidocaine-prilocaine (EMLA) cream Apply 30 -45 mins prior to port access. 30 g 0  . metFORMIN (GLUCOPHAGE-XR) 500 MG 24 hr tablet TAKE 2 TABLETS BY MOUTH EVERY DAY 180 tablet 3  . nystatin (MYCOSTATIN/NYSTOP) powder APPLY TO AFFECTED AREA OF SKIN 3X A DAY AS NEEDED IN A SUFFICIENT AMOUNT 30 g 2  .  oxyCODONE (OXY IR/ROXICODONE) 5 MG immediate release tablet Take 1 tablet (5 mg total) by mouth every 6 (six) hours as needed for severe pain or breakthrough pain. 30 tablet 0  . pantoprazole (PROTONIX) 40 MG tablet Take 1 tablet (40 mg total) by mouth daily. 30 tablet 3  . prochlorperazine (COMPAZINE) 10 MG tablet Take 1 tablet (10 mg total) by mouth every 6 (six) hours as needed for nausea or vomiting. 40 tablet 1  . senna (SENOKOT) 8.6 MG TABS tablet Take 1 tablet (8.6 mg total) by mouth at bedtime as needed for mild constipation. 120 tablet 0   No current facility-administered medications for this visit.      Marland Kitchen  PHYSICAL EXAMINATION: ECOG PERFORMANCE STATUS: 0 - Asymptomatic  Vitals:   07/22/20 0834  BP: 91/65  Pulse: 92  Resp: 16  Temp: 97.6 F (36.4 C)  SpO2: 99%   Filed Weights    07/22/20 0834  Weight: 185 lb (83.9 kg)    Physical Exam Constitutional:      Comments: Patient is accompanied by daughter.  Patient in wheelchair.  HENT:     Head: Normocephalic and atraumatic.     Mouth/Throat:     Pharynx: No oropharyngeal exudate.  Eyes:     General: Scleral icterus present.     Pupils: Pupils are equal, round, and reactive to light.  Neck:     Comments: Right-sided neck mass chronic Cardiovascular:     Rate and Rhythm: Normal rate and regular rhythm.  Pulmonary:     Effort: Pulmonary effort is normal. No respiratory distress.     Breath sounds: Normal breath sounds. No wheezing.  Abdominal:     General: Bowel sounds are normal. There is no distension.     Palpations: Abdomen is soft. There is no mass.     Tenderness: There is no abdominal tenderness. There is no guarding or rebound.  Musculoskeletal:        General: No tenderness. Normal range of motion.     Cervical back: Normal range of motion and neck supple.  Skin:    General: Skin is warm.  Neurological:     Mental Status: She is alert and oriented to person, place, and time.  Psychiatric:        Mood and Affect: Affect normal.    LABORATORY DATA:  I have reviewed the data as listed Lab Results  Component Value Date   WBC 9.2 07/22/2020   HGB 12.9 07/22/2020   HCT 39.7 07/22/2020   MCV 84.3 07/22/2020   PLT 280 07/22/2020   Recent Labs    10/06/19 1056 04/08/20 1139 05/22/20 2212 07/09/20 0405 07/15/20 1037 07/22/20 0815  NA 140 140   < > 138 135 139  K 4.6 4.3   < > 3.6 4.0 4.0  CL 102 103   < > 101 97* 100  CO2 26 24   < > 25 20* 21*  GLUCOSE 150* 156*   < > 103* 141* 132*  BUN 20 22   < > 10 30* 21  CREATININE 1.17* 0.96*   < > 0.85 1.61* 1.32*  CALCIUM 10.0 10.0   < > 9.0 9.4 10.0  GFRNONAA 45* 57*   < > >60 33* 42*  GFRAA 52* 66  --   --   --   --   PROT 7.2 7.1   < > 7.1 8.2* 8.5*  ALBUMIN  --   --    < > 3.2* 3.5 3.5  AST 21 17   < > 227* 236* 251*  ALT 15 9   < >  119* 109* 123*  ALKPHOS  --   --    < > 196* 151* 137*  BILITOT 0.9 1.0   < > 4.9* 6.2* 6.5*  BILIDIR  --   --   --   --   --  3.1*  IBILI  --   --   --   --   --  3.4*   < > = values in this interval not displayed.    RADIOGRAPHIC STUDIES: I have personally reviewed the radiological images as listed and agreed with the findings in the report. MR Abdomen W Wo Contrast  Result Date: 06/27/2020 CLINICAL DATA:  Abdominal pain.  Pancreatic lesion on recent CT. EXAM: MRI ABDOMEN WITHOUT AND WITH CONTRAST TECHNIQUE: Multiplanar multisequence MR imaging of the abdomen was performed both before and after the administration of intravenous contrast. CONTRAST:  43m GADAVIST GADOBUTROL 1 MMOL/ML IV SOLN COMPARISON:  CT on 05/23/2020 FINDINGS: Lower chest: No acute findings. Hepatobiliary: No hepatic masses identified. A few tiny sub-cm cysts are noted in the left lobe. Gallbladder is dilated and contains numerous tiny less than 1 cm gallstones. No evidence of acute cholecystitis. Diffuse biliary ductal dilatation is seen, with common bile duct measuring 10 mm. Stricture of the distal common bile duct is seen at the level the pancreatic head. Pancreas: Mild pancreatic ductal dilatation is seen to the level of the pancreatic head. A poorly defined hypovascular mass is seen in the pancreatic head which measures 3.5 x 3.2 cm on image 57/22. This shows no evidence of vascular encasement. No evidence peripancreatic inflammatory changes or fluid collections. Spleen:  Within normal limits in size and appearance. Adrenals/Urinary Tract: No masses identified. A few tiny sub-cm left renal cysts are noted. No evidence of hydronephrosis. Stomach/Bowel: Visualized portion unremarkable. Vascular/Lymphatic: 1 cm peripancreatic lymph node seen along the anterior and superior margin of the pancreatic head, suspicious for metastatic disease. No other pathologically enlarged lymph nodes identified. No abdominal aortic aneurysm. Other:   None. Musculoskeletal:  No suspicious bone lesions identified. IMPRESSION: 3.5 cm ill-defined hypovascular mass in the pancreatic head, highly suspicious for pancreatic adenocarcinoma. This causes mild diffuse biliary and pancreatic ductal dilatation. 1 cm peripancreatic lymph node, suspicious for metastatic disease. Distended gallbladder with numerous small gallstones. No radiographic evidence of acute cholecystitis. These results will be called to the ordering clinician or representative by the Radiologist Assistant, and communication documented in the PACS or [employer] Electronically Signed   By: JMarlaine HindM.D.   On: 06/27/2020 15:27   [place]raoperative  Result Date: 07/08/2020 INDICATION: Jaundice, biliary obstruction, pancreas cancer, successful ERCP EXAM: ULTRASOUND FLUOROSCOPIC LEFT INTERNAL EXTERNAL 10 FRENCH BILIARY DRAIN MEDICATIONS: 2 g Mefoxin; The antibiotic was administered within an appropriate time frame prior to the initiation of the procedure. ANESTHESIA/SEDATION: Moderate (conscious) sedation was employed during this procedure. A total of Versed 2.0 mg and Fentanyl 150 mcg was administered intravenously. Moderate Sedation Time: 32 minutes. The patient's level of consciousness and vital signs were monitored continuously by radiology nursing throughout the procedure under my direct supervision. FLUOROSCOPY TIME:  Fluoroscopy Time: 5 minutes 12 seconds (89 mGy). COMPLICATIONS: None immediate. PROCEDURE: Informed written consent was obtained from the patient after a thorough discussion of the procedural risks, benefits and alternatives. All questions were addressed. Maximal Sterile Barrier Technique was utilized including caps, mask, sterile gowns, sterile gloves, sterile drape, hand hygiene and  skin antiseptic. A timeout was performed prior to the initiation of the procedure. Previous imaging reviewed. Preliminary ultrasound performed of the liver. Left hepatic ducts were localized  from a subxiphoid approach. Overlying skin marked. Under sterile conditions and local anesthesia, a 21 gauge 15 cm access was advanced percutaneously under ultrasound into a peripheral left hepatic duct. Needle position confirmed with ultrasound and return of clear bile. Ultrasound images obtained for documentation. Contrast injection performed for initial cholangiogram. This demonstrates diffuse biliary dilatation and distal obstruction. 018 guidewire advanced into the biliary tree followed by the transitional dilator set. Amplatz guidewire advanced followed by a Kumpe catheter. Kumpe catheter advanced into the duodenum. Contrast injection confirms position in the duodenum. Over an Amplatz guidewire, tract dilatation performed to insert a 10 [place] internal external biliary drain. Retention loop positioned in the duodenum. Contrast injection confirms position. Images obtained for documentation. Cholangiogram confirms biliary obstruction. Cystic duct is patent with numerous gallstones noted. IMPRESSION: Successful 10 French left internal external biliary drain for distal CBD obstruction. Electronically Signed   By: Jerilynn Mages.  Shick M.D.   On: 07/08/2020 12:19   NM PET Image Initial (PI) Skull Base To Thigh  Addendum Date: 07/14/2020   ADDENDUM REPORT: 07/14/2020 16:13 ADDENDUM: Note that there is some mild to moderate airway narrowing at the thoracic inlet. The patient has a chronic mass in this area. Would still however correlate with any respiratory symptoms given the marked displacement of the trachea. For reference the trachea in the upper chest is 15 mm and at the thoracic inlet is 9 mm greatest width. Note also that nodular areas in the chest are without increased metabolic activity. These results will be called to the ordering clinician or representative by the Radiologist Assistant, and communication documented in the PACS or [employer]. Electronically Signed   By: Zetta Bills M.D.   On: 07/14/2020  16:13   Result Date: 07/14/2020 CLINICAL DATA:  Initial treatment strategy for pancreatic cancer. EXAM: NUCLEAR MEDICINE PET SKULL BASE TO THIGH TECHNIQUE: 10.2 mCi F-18 FDG was injected intravenously. Full-ring PET imaging was performed from the skull base to thigh after the radiotracer. CT data was obtained and used for attenuation correction and anatomic localization. Fasting blood glucose: CT of the abdomen and pelvis from May 23, 2020 mg/dl COMPARISON:  Abdominal CT from May 23, 2020 FINDINGS: Mediastinal blood pool activity: SUV max 3.7 Liver activity: SUV max NA NECK: Large RIGHT neck mass 9.6 x 7.8 cm with areas of increased FDG uptake with maximum SUV of 5.9. There is tracheal deviation that is been described on multiple prior chest x-rays dating back to 2006. No discrete adenopathy is present at the thoracic inlet or in the neck. Normal anatomy is highly distorted and the airway is narrowed secondary to mass effect from this thoracic inlet/neck mass that extends from the submandibular region into the upper chest. Incidental CT findings: none CHEST: No hypermetabolic mediastinal or hilar nodes. Scattered pulmonary nodules, described below show no increased metabolic activity above blood pool largest in the RIGHT chest with a maximum SUV of 1.2 Incidental CT findings: Calcified atheromatous plaque of the thoracic aorta. Nonaneurysmal. Mildly engorged central pulmonary vasculature 3.2 cm. Mitral annular calcifications. Normal heart size. No pericardial effusion. No adenopathy by size criteria in the chest low-density area measuring water density in the mediastinum may represent a small pericardial cyst seen on image 62 and 63 of series 3 and shows no increased FDG uptake. Basilar atelectasis. Areas of nodularity are present, 1  area along the major fissure in the RIGHT chest measures 10 x 7 mm. Apical pleural scarring. Ill-defined nodule in the LEFT upper lobe measuring 4 mm. Another in the LEFT  upper lobe measuring 4 mm on images 53 and 57 respectively. Pulmonary emphysema. ABDOMEN/PELVIS: Large hypermetabolic mass in the pancreatic-duodenal groove with a maximum SUV of 30 corresponding to the abnormality seen on prior images, difficult to separate from duodenum on the current exam not grossly changed compared to the recent MRI evaluation Heterogeneous hepatic activity without discrete lesion. Increased metabolic uptake along the patient's biliary drainage catheter passing through the LEFT hepatic lobe. Lymph node adjacent to the pancreatic head measuring approximately 12 mm with increased metabolic activity (image 374, series 3. Metabolic activity is difficult to separate from the adjacent pancreatic mass maximum SUV estimated to be in the range of 6.5. Hepatic gastric/celiac nodes, a cluster of lymph nodes seen on image 114 of series 3 also with some dilated vascular channels in this area metabolic activity at approximately 3.5 maximum SUV, largest node in this area approximately 6 mm. No retroperitoneal adenopathy below the renal veins. Diffuse bowel activity Incidental CT findings: Cholelithiasis. Numerous gallstones in the gallbladder. Spleen, adrenal glands and kidneys without acute process. No perinephric stranding. Urinary bladder collapsed. No acute gastrointestinal process. Normal appendix. Uterus and adnexa unremarkable by CT. Small fat containing umbilical hernia. SKELETON: No focal hypermetabolic activity to suggest skeletal metastasis. 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 Spinal degenerative changes. IMPRESSION: Pancreatic mass with local adenopathy now with biliary drain in place. Signs of peripancreatic nodal enlargement with smaller nodes in the gastrohepatic recess. No definitive evidence of distant metastatic disease. Areas of ill-defined nodularity in the chest most notably in the RIGHT mid chest along the minor fissure, of uncertain significance.  The dominant area shows tubular morphology. Close attention on follow-up is suggested, consider short interval follow-up for further evaluation. Large mediastinal/lower lobe neck mass may represent a large border and is been commented on on multiple prior chest x-rays. No comparison cross-sectional imaging is available. This area shows increased FDG uptake. Focused ultrasound and biopsy is suggested. Recommend thyroid US and biopsy yes sarcasm just cleanup this 1 finger and there is(ref: J Am Coll Radiol. 2015 Feb;12(2): 143-50). Electronically Signed: By: Zetta Bills M.D. On: 07/14/2020 15:53   IR BILIARY DRAIN PLACEMENT WITH CHOLANGIOGRAM  Result Date: 07/08/2020 INDICATION: Jaundice, biliary obstruction, pancreas cancer, successful ERCP EXAM: ULTRASOUND FLUOROSCOPIC LEFT INTERNAL EXTERNAL 10 FRENCH BILIARY DRAIN MEDICATIONS: 2 g Mefoxin; The antibiotic was administered within an appropriate time frame prior to the initiation of the procedure. ANESTHESIA/SEDATION: Moderate (conscious) sedation was employed during this procedure. A total of Versed 2.0 mg and Fentanyl 150 mcg was administered intravenously. Moderate Sedation Time: 32 minutes. The patient's level of consciousness and vital signs were monitored continuously by radiology nursing throughout the procedure under my direct supervision. FLUOROSCOPY TIME:  Fluoroscopy Time: 5 minutes 12 seconds (89 mGy). COMPLICATIONS: None immediate. PROCEDURE: Informed written consent was obtained from the patient after a thorough discussion of the procedural risks, benefits and alternatives. All questions were addressed. Maximal Sterile Barrier Technique was utilized including caps, mask, sterile gowns, sterile gloves, sterile drape, hand hygiene and skin antiseptic. A timeout was performed prior to the initiation of the procedure. Previous imaging reviewed. Preliminary ultrasound performed of the liver. Left hepatic ducts were localized from a subxiphoid approach.  Overlying skin marked. Under sterile conditions and local anesthesia, a 21 gauge 15 cm access  was advanced percutaneously under ultrasound into a peripheral left hepatic duct. Needle position confirmed with ultrasound and return of clear bile. Ultrasound images obtained for documentation. Contrast injection performed for initial cholangiogram. This demonstrates diffuse biliary dilatation and distal obstruction. 018 guidewire advanced into the biliary tree followed by the transitional dilator set. Amplatz guidewire advanced followed by a Kumpe catheter. Kumpe catheter advanced into the duodenum. Contrast injection confirms position in the duodenum. Over an Amplatz guidewire, tract dilatation performed to insert a 10 [place] internal external biliary drain. Retention loop positioned in the duodenum. Contrast injection confirms position. Images obtained for documentation. Cholangiogram confirms biliary obstruction. Cystic duct is patent with numerous gallstones noted. IMPRESSION: Successful 10 French left internal external biliary drain for distal CBD obstruction. Electronically Signed   By: Jerilynn Mages.  Shick M.D.   On: 07/08/2020 12:19    ASSESSMENT & PLAN:   Primary pancreatic cancer [facility]) #Pancreatic adenocarcinoma locally advanced/invading duodenum; 1 cm peripancreatic lymphadenopathy; CT scan at [facility] 2022]-hepatic artery abutment; T4N1-stage III disease.  #Had a long discussion the patient/daughter-regarding stage and overall seriousness of the disease.  Discussed that the long-term chance for cure is about 30% or less.  Discussed with Dr. Manuella Ghazi at [facility]  #Recommend gemcitabine Abraxane weekly chemo; Discussed the potential side effects including but not limited to-increasing fatigue, nausea vomiting, diarrhea, hair loss, sores in the mouth, increase risk of infection and also neuropathy.   # Obstructive jaundice/ elevated LFTs-bilirubin still elevated at 6; stable AST ALT alkaline phosphatase no evidence  of obvious obstruction noted on recent imaging at [facility]/PET scan at [facility].  Suspect labs are lagging behind.  Will monitor closely  #Borderline hypotension systolic blood pressure 76E; plan IV fluids.  Recommend holding off blood pressure medications.  # Palliative care evaluation: Introduced palliative care philosophy and services. I discussed the need for palliative care evaluation/symptom management to help quality of life in the context of difficult disease. .  Patient is interested; will make referral to [facility].  # DISPOSITION # IVfs over 1 hour today # Josh- pallaitvie care # Joli- nutrition # Follow up on 5/5-MD- labs- cbc/Cmp;Gem-Abarxane [new];- Dr.B  # 40 minutes face-to-face with the patient discussing the above plan of care; more than 50% of time spent on prognosis/ natural history; counseling and coordination.    All questions were answered. The patient knows to call the clinic with any problems, questions or concerns.    Cammie Sickle, MD 07/22/2020 12:49 PM

## 2020-07-22 NOTE — Progress Notes (Signed)
Having some weakness today. Noticed it this morning when waking up. She is in a wheelchair today. Has lost 6 pounds since 4/22

## 2020-07-22 NOTE — Progress Notes (Signed)
Pt had sudden onset of vomiting leaving exam room. Denies any pain. Started IV hydration and gave zofran for the nausea. Eating ice chips. Reports feeling much better at time of discharge. VSS.

## 2020-07-22 NOTE — Assessment & Plan Note (Addendum)
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  #Had a long discussion the patient/daughter-regarding stage and overall seriousness of the disease.  Discussed that the long-term chance for cure is about 30% or less.  Discussed with Dr. Manuella Ghazi at [facility].  #Recommend gemcitabine Abraxane weekly chemo; Discussed the potential side effects including but not limited to-increasing fatigue, nausea vomiting, diarrhea, hair loss, sores in the mouth, increase risk of infection and also neuropathy.   # Obstructive jaundice/ elevated LFTs-bilirubin still elevated at 6; stable AST ALT alkaline phosphatase no evidence of obvious obstruction noted on recent imaging at [facility]/PET scan at [facility].  Suspect labs are lagging behind.  Will monitor closely  #Borderline hypotension systolic blood pressure 70J; plan IV fluids.  Recommend holding off blood pressure medications.  # Palliative care evaluation: Introduced palliative care philosophy and services. I discussed the need for palliative care evaluation/symptom management to help quality of life in the context of difficult disease. .  Patient is interested; will make referral to [facility].  # DISPOSITION # IVfs over 1 hour today # Josh- pallaitvie care # Joli- nutrition # Follow up on 5/5-MD- labs- cbc/Cmp;Gem-Abarxane [new];- Dr.B  # 40 minutes face-to-face with the patient discussing the above plan of care; more than 50% of time spent on prognosis/ natural history; counseling and coordination.

## 2020-07-25 ENCOUNTER — Inpatient Hospital Stay: Payer: Medicare PPO

## 2020-07-25 ENCOUNTER — Inpatient Hospital Stay: Payer: Medicare PPO | Attending: Hospice and Palliative Medicine | Admitting: Hospice and Palliative Medicine

## 2020-07-25 VITALS — BP 101/70 | HR 98 | Temp 98.5°F | Resp 16 | Wt 183.2 lb

## 2020-07-25 DIAGNOSIS — R5383 Other fatigue: Secondary | ICD-10-CM | POA: Insufficient documentation

## 2020-07-25 DIAGNOSIS — Z515 Encounter for palliative care: Secondary | ICD-10-CM | POA: Diagnosis not present

## 2020-07-25 DIAGNOSIS — C250 Malignant neoplasm of head of pancreas: Secondary | ICD-10-CM | POA: Insufficient documentation

## 2020-07-25 DIAGNOSIS — K831 Obstruction of bile duct: Secondary | ICD-10-CM | POA: Diagnosis not present

## 2020-07-25 DIAGNOSIS — R112 Nausea with vomiting, unspecified: Secondary | ICD-10-CM | POA: Diagnosis not present

## 2020-07-25 DIAGNOSIS — C259 Malignant neoplasm of pancreas, unspecified: Secondary | ICD-10-CM

## 2020-07-25 DIAGNOSIS — I959 Hypotension, unspecified: Secondary | ICD-10-CM | POA: Diagnosis not present

## 2020-07-25 DIAGNOSIS — R911 Solitary pulmonary nodule: Secondary | ICD-10-CM | POA: Insufficient documentation

## 2020-07-25 MED ORDER — ONDANSETRON HCL 8 MG PO TABS: 8.0000 mg | ORAL_TABLET | Freq: Three times a day (TID) | ORAL | 1 refills | Status: AC | PRN

## 2020-07-25 NOTE — Progress Notes (Signed)
Nutrition Assessment   Reason for Assessment:  Poor appetite, weight loss, pancreatic cancer   ASSESSMENT:   80 year old female with pancreatic cancer invading duodenum.  Past medical history of HTN, DM, HLD.  Patient to start chemotherapy.   Met with patient and daughter in clinic.  Patient reports that her appetite has been decrease over the last 2 weeks after placement of biliary drain. Reports vomiting over the last few days.  Per MD note no obstruction.  Patient reports that over the last few days has been eating applesauce, popscile, fruit (melons), drinking water and gingerale.  Does not like gatorade.  Has some glucerna/glucose control shakes at home.    Patient reports that her family provides meals for her and goes to grocery store for her.  She has access to food.    Nutrition Focused Physical Exam:   Orbital: normal Buccal: normal Upper arm: normal Ribs: normal Temple: normal Clavicle: normal Scapula: mild Hand: moderate Thigh/patellar: mild Calf: mild   Medications: reviewed   Labs: reviewed   Anthropometrics:   Weight 183 lb today Ht: 63 inches BMI: 32  185 lb on 4/29 226 2/27  19% weight loss in the last 2 months   Estimated Energy Needs  Kcals: 1800-2000 Protein: 78-104 g (Using IBW of 52 kg 1.5-2.0 g) Fluid: 1.8L   NUTRITION DIAGNOSIS: Inadequate oral intake related to cancer and cancer related side effects (nausea, vomiting) as evidenced by 19% weight loss in the last 2 months and eating < or equal to 50% of estimated energy needs for > or equal to 5 days   MALNUTRITION DIAGNOSIS: Patient meets criteria for severe malnutrition in context of acute illness (likely progressing to chronic illness) as evidenced by 19% weight loss in the last 2 months and eating < or equal to 50% of estimated energy needs for > or equal to 5 days.   INTERVENTION:  Encouraged taking new nausea medication as prescribed to help relieve symptom.   Discussed foods  to choose with nausea and vomiting. Handout provided Contact information given   MONITORING, EVALUATION, GOAL: weight trends, intake   Next Visit: Thursday, May 19 during infusion  Braidyn Scorsone B. Zenia Resides, [place], [place] Registered Dietitian [phone] (mobile)

## 2020-07-25 NOTE — Progress Notes (Signed)
[place]  Telephone:([phone] [phone]   Name: Tracy Bolton Date: 07/25/2020 MRN: [id number]  DOB: July 04, 1940  Patient Care Team: Delsa Grana, PA-C as PCP - General (Family Medicine) Loney Loh, Michaell Cowing, MD as Referring Physician (Endocrinology) Olean Ree, MD as Consulting Physician (General Surgery) Clent Jacks, RN as Oncology Nurse Navigator    REASON FOR CONSULTATION: Tracy Bolton is a 80 y.o. 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  Patient has not felt to be a surgical candidate as the cancer abuts the hepatic artery.  She has had obstructive jaundice.  She is referred to palliative care to help address goals and manage ongoing symptoms.  SOCIAL HISTORY:     reports that she has never smoked. She has never used smokeless tobacco. She reports that she does not drink alcohol and does not use drugs.  Patient is divorced.  She lives at home and her daughters rotate caregiving.  ADVANCE DIRECTIVES:  Does not have  CODE STATUS:   PAST MEDICAL HISTORY: Past Medical History:  Diagnosis Date  . Arthritis   . Gout, chronic   . Hyperlipidemia   . Hypertension   . Osteoporosis   . Thyroid disease     PAST SURGICAL HISTORY:  Past Surgical History:  Procedure Laterality Date  . CATARACT EXTRACTION, BILATERAL  02/2018  . COLONOSCOPY  2016  . IR BILIARY DRAIN PLACEMENT WITH CHOLANGIOGRAM  07/08/2020  . LUNG SURGERY Right 2004   biopsy normal    HEMATOLOGY/ONCOLOGY HISTORY:  Oncology History Overview Note  # FEB 28th 2022-  Ill-defined hypodense area seen within the pancreatic head which appears to abut the duodenum and adjacent to coarse calcifications. 2. Duodenal diverticula with focal wall thickening/narrowing which; MRI Abdomen- 3.5 cm ill-defined hypovascular mass in the pancreatic head, highly suspicious  for pancreatic adenocarcinoma. This causes mild diffuse biliary and pancreatic ductal dilatation. 1 cm peripancreatic lymph node, suspicious for metastatic disease.  # EUS/ERCP-attempted at [facility] 8th]; Dr.Spaete-unable to traverse duodenal stricture; duodenal biopsy positive adenocarcinoma.   could be due to mild duodenitis. 5. 8 mm pulmonary nodule within the right lung base. N # Goiter [Dr.Clemmons; [facility]: now with [facility]]   Impression:  1. Pancreatic head mass with invasion of the adjacent duodenum. Mild  stranding inflammation may be postprocedural or reflect mild superimposed  pancreatitis.  2. Vascular involvement as above.  3. Prominent porta hepatis and peripancreatic lymph nodes, concerning for  nodal involvement. No other evidence of metastatic disease.  4. Endometrium abnormally thickened for age. Correlate with abnormal  bleeding and consider GYN workup as indicated.    #April 2022-pT4pN1- stage III pancreatic adenocarcinoma [s/p Eval at [facility], [place]   Primary pancreatic cancer ([place])  07/05/2020 Initial Diagnosis   Primary pancreatic cancer ([place])   07/22/2020 -  Chemotherapy    Patient is on Treatment Plan: PANCREATIC ABRAXANE / GEMCITABINE D1,8,15 Q28D        ALLERGIES:  has No Known Allergies.  MEDICATIONS:  Current Outpatient Medications  Medication Sig Dispense Refill  . allopurinol (ZYLOPRIM) 100 MG tablet TAKE 1 TABLET BY MOUTH EVERY DAY 90 tablet 3  . amLODipine (NORVASC) 10 MG tablet TAKE 1 TABLET BY MOUTH EVERY DAY 90 tablet 3  . aspirin 81 MG tablet Take 81 mg by mouth.    Marland Kitchen atenolol (TENORMIN) 50 MG tablet TAKE 1 TABLET BY MOUTH EVERY DAY 90 tablet 3  .  atorvastatin (LIPITOR) 10 MG tablet TAKE 1 TABLET BY MOUTH EVERYDAY AT BEDTIME 90 tablet 3  . blood glucose meter kit and supplies KIT Dispense based on patient and insurance preference. Use up to four times daily as directed. 1 each 0  . lidocaine-prilocaine (EMLA) cream Apply 30 -45 mins prior  to port access. 30 g 0  . metFORMIN (GLUCOPHAGE-XR) 500 MG 24 hr tablet TAKE 2 TABLETS BY MOUTH EVERY DAY 180 tablet 3  . nystatin (MYCOSTATIN/NYSTOP) powder APPLY TO AFFECTED AREA OF SKIN 3X A DAY AS NEEDED IN A SUFFICIENT AMOUNT 30 g 2  . oxyCODONE (OXY IR/ROXICODONE) 5 MG immediate release tablet Take 1 tablet (5 mg total) by mouth every 6 (six) hours as needed for severe pain or breakthrough pain. 30 tablet 0  . pantoprazole (PROTONIX) 40 MG tablet Take 1 tablet (40 mg total) by mouth daily. 30 tablet 3  . prochlorperazine (COMPAZINE) 10 MG tablet Take 1 tablet (10 mg total) by mouth every 6 (six) hours as needed for nausea or vomiting. 40 tablet 1  . senna (SENOKOT) 8.6 MG TABS tablet Take 1 tablet (8.6 mg total) by mouth at bedtime as needed for mild constipation. 120 tablet 0   No current facility-administered medications for this visit.    VITAL SIGNS: There were no vitals taken for this visit. There were no vitals filed for this visit.  Estimated body mass index is 32.77 kg/m as calculated from the following:   Height as of 07/22/20: '5\' 3"'  (1.6 m).   Weight as of 07/22/20: 185 lb (83.9 kg).  LABS: CBC:    Component Value Date/Time   WBC 9.2 07/22/2020 0815   HGB 12.9 07/22/2020 0815   HGB 13.7 05/19/2015 0951   HCT 39.7 07/22/2020 0815   HCT 42.7 05/19/2015 0951   PLT 280 07/22/2020 0815   PLT 233 05/19/2015 0951   MCV 84.3 07/22/2020 0815   MCV 83 05/19/2015 0951   MCV 86 08/31/2013 1300   NEUTROABS 5.8 07/22/2020 0815   NEUTROABS 4.6 05/19/2015 0951   NEUTROABS 6.9 (H) 08/31/2013 1300   LYMPHSABS 2.4 07/22/2020 0815   LYMPHSABS 3.9 (H) 05/19/2015 0951   LYMPHSABS 2.5 08/31/2013 1300   MONOABS 0.8 07/22/2020 0815   MONOABS 0.8 08/31/2013 1300   EOSABS 0.2 07/22/2020 0815   EOSABS 0.2 05/19/2015 0951   EOSABS 0.2 08/31/2013 1300   BASOSABS 0.1 07/22/2020 0815   BASOSABS 0.0 05/19/2015 0951   BASOSABS 0.1 08/31/2013 1300   Comprehensive Metabolic Panel:     Component Value Date/Time   NA 139 07/22/2020 0815   NA 141 05/19/2015 0951   NA 142 09/15/2013 1906   K 4.0 07/22/2020 0815   K 3.7 09/15/2013 1906   CL 100 07/22/2020 0815   CL 108 (H) 09/15/2013 1906   CO2 21 (L) 07/22/2020 0815   CO2 23 09/15/2013 1906   BUN 21 07/22/2020 0815   BUN 15 05/19/2015 0951   BUN 10 09/15/2013 1906   CREATININE 1.32 (H) 07/22/2020 0815   CREATININE 0.96 (H) 04/08/2020 1139   GLUCOSE 132 (H) 07/22/2020 0815   GLUCOSE 123 (H) 09/15/2013 1906   CALCIUM 10.0 07/22/2020 0815   CALCIUM 8.9 09/15/2013 1906   AST 251 (H) 07/22/2020 0815   AST 22 09/15/2013 1906   ALT 123 (H) 07/22/2020 0815   ALT 20 09/15/2013 1906   ALKPHOS 137 (H) 07/22/2020 0815   ALKPHOS 82 09/15/2013 1906   BILITOT 6.5 (H) 07/22/2020 0815  BILITOT 0.7 05/19/2015 0951   BILITOT 0.5 09/15/2013 1906   PROT 8.5 (H) 07/22/2020 0815   PROT 7.6 05/19/2015 0951   PROT 6.3 (L) 09/15/2013 1906   ALBUMIN 3.5 07/22/2020 0815   ALBUMIN 4.3 05/19/2015 0951   ALBUMIN 3.0 (L) 09/15/2013 1906    RADIOGRAPHIC STUDIES: MR Abdomen W Wo Contrast  Result Date: 06/27/2020 CLINICAL DATA:  Abdominal pain.  Pancreatic lesion on recent CT. EXAM: MRI ABDOMEN WITHOUT AND WITH CONTRAST TECHNIQUE: Multiplanar multisequence MR imaging of the abdomen was performed both before and after the administration of intravenous contrast. CONTRAST:  62m GADAVIST GADOBUTROL 1 MMOL/ML IV SOLN COMPARISON:  CT on 05/23/2020 FINDINGS: Lower chest: No acute findings. Hepatobiliary: No hepatic masses identified. A few tiny sub-cm cysts are noted in the left lobe. Gallbladder is dilated and contains numerous tiny less than 1 cm gallstones. No evidence of acute cholecystitis. Diffuse biliary ductal dilatation is seen, with common bile duct measuring 10 mm. Stricture of the distal common bile duct is seen at the level the pancreatic head. Pancreas: Mild pancreatic ductal dilatation is seen to the level of the pancreatic head. A  poorly defined hypovascular mass is seen in the pancreatic head which measures 3.5 x 3.2 cm on image 57/22. This shows no evidence of vascular encasement. No evidence peripancreatic inflammatory changes or fluid collections. Spleen:  Within normal limits in size and appearance. Adrenals/Urinary Tract: No masses identified. A few tiny sub-cm left renal cysts are noted. No evidence of hydronephrosis. Stomach/Bowel: Visualized portion unremarkable. Vascular/Lymphatic: 1 cm peripancreatic lymph node seen along the anterior and superior margin of the pancreatic head, suspicious for metastatic disease. No other pathologically enlarged lymph nodes identified. No abdominal aortic aneurysm. Other:  None. Musculoskeletal:  No suspicious bone lesions identified. IMPRESSION: 3.5 cm ill-defined hypovascular mass in the pancreatic head, highly suspicious for pancreatic adenocarcinoma. This causes mild diffuse biliary and pancreatic ductal dilatation. 1 cm peripancreatic lymph node, suspicious for metastatic disease. Distended gallbladder with numerous small gallstones. No radiographic evidence of acute cholecystitis. These results will be called to the ordering clinician or representative by the Radiologist Assistant, and communication documented in the PACS or [employer] Electronically Signed   By: JMarlaine HindM.D.   On: 06/27/2020 15:27   [place]  Result Date: 07/08/2020 INDICATION: Jaundice, biliary obstruction, pancreas cancer, successful ERCP EXAM: ULTRASOUND FLUOROSCOPIC LEFT INTERNAL EXTERNAL 10 FRENCH BILIARY DRAIN MEDICATIONS: 2 g Mefoxin; The antibiotic was administered within an appropriate time frame prior to the initiation of the procedure. ANESTHESIA/SEDATION: Moderate (conscious) sedation was employed during this procedure. A total of Versed 2.0 mg and Fentanyl 150 mcg was administered intravenously. Moderate Sedation Time: 32 minutes. The patient's level of consciousness and vital signs were  monitored continuously by radiology nursing throughout the procedure under my direct supervision. FLUOROSCOPY TIME:  Fluoroscopy Time: 5 minutes 12 seconds (89 mGy). COMPLICATIONS: None immediate. PROCEDURE: Informed written consent was obtained from the patient after a thorough discussion of the procedural risks, benefits and alternatives. All questions were addressed. Maximal Sterile Barrier Technique was utilized including caps, mask, sterile gowns, sterile gloves, sterile drape, hand hygiene and skin antiseptic. A timeout was performed prior to the initiation of the procedure. Previous imaging reviewed. Preliminary ultrasound performed of the liver. Left hepatic ducts were localized from a subxiphoid approach. Overlying skin marked. Under sterile conditions and local anesthesia, a 21 gauge 15 cm access was advanced percutaneously under ultrasound into a peripheral left hepatic duct. Needle position confirmed with ultrasound  and return of clear bile. Ultrasound images obtained for documentation. Contrast injection performed for initial cholangiogram. This demonstrates diffuse biliary dilatation and distal obstruction. 018 guidewire advanced into the biliary tree followed by the transitional dilator set. Amplatz guidewire advanced followed by a Kumpe catheter. Kumpe catheter advanced into the duodenum. Contrast injection confirms position in the duodenum. Over an Amplatz guidewire, tract dilatation performed to insert a 10 [place] internal external biliary drain. Retention loop positioned in the duodenum. Contrast injection confirms position. Images obtained for documentation. Cholangiogram confirms biliary obstruction. Cystic duct is patent with numerous gallstones noted. IMPRESSION: Successful 10 French left internal external biliary drain for distal CBD obstruction. Electronically Signed   By: Jerilynn Mages.  Shick M.D.   On: 07/08/2020 12:19   NM PET Image Initial (PI) Skull Base To Thigh  Addendum Date: 07/14/2020    ADDENDUM REPORT: 07/14/2020 16:13 ADDENDUM: Note that there is some mild to moderate airway narrowing at the thoracic inlet. The patient has a chronic mass in this area. Would still however correlate with any respiratory symptoms given the marked displacement of the trachea. For reference the trachea in the upper chest is 15 mm and at the thoracic inlet is 9 mm greatest width. Note also that nodular areas in the chest are without increased metabolic activity. These results will be called to the ordering clinician or representative by the Radiologist Assistant, and communication documented in the PACS or [employer]. Electronically Signed   By: Zetta Bills M.D.   On: 07/14/2020 16:13   Result Date: 07/14/2020 CLINICAL DATA:  Initial treatment strategy for pancreatic cancer. EXAM: NUCLEAR MEDICINE PET SKULL BASE TO THIGH TECHNIQUE: 10.2 mCi F-18 FDG was injected intravenously. Full-ring PET imaging was performed from the skull base to thigh after the radiotracer. CT data was obtained and used for attenuation correction and anatomic localization. Fasting blood glucose: CT of the abdomen and pelvis from May 23, 2020 mg/dl COMPARISON:  Abdominal CT from May 23, 2020 FINDINGS: Mediastinal blood pool activity: SUV max 3.7 Liver activity: SUV max NA NECK: Large RIGHT neck mass 9.6 x 7.8 cm with areas of increased FDG uptake with maximum SUV of 5.9. There is tracheal deviation that is been described on multiple prior chest x-rays dating back to 2006. No discrete adenopathy is present at the thoracic inlet or in the neck. Normal anatomy is highly distorted and the airway is narrowed secondary to mass effect from this thoracic inlet/neck mass that extends from the submandibular region into the upper chest. Incidental CT findings: none CHEST: No hypermetabolic mediastinal or hilar nodes. Scattered pulmonary nodules, described below show no increased metabolic activity above blood pool largest in the RIGHT  chest with a maximum SUV of 1.2 Incidental CT findings: Calcified atheromatous plaque of the thoracic aorta. Nonaneurysmal. Mildly engorged central pulmonary vasculature 3.2 cm. Mitral annular calcifications. Normal heart size. No pericardial effusion. No adenopathy by size criteria in the chest low-density area measuring water density in the mediastinum may represent a small pericardial cyst seen on image 62 and 63 of series 3 and shows no increased FDG uptake. Basilar atelectasis. Areas of nodularity are present, 1 area along the major fissure in the RIGHT chest measures 10 x 7 mm. Apical pleural scarring. Ill-defined nodule in the LEFT upper lobe measuring 4 mm. Another in the LEFT upper lobe measuring 4 mm on images 53 and 57 respectively. Pulmonary emphysema. ABDOMEN/PELVIS: Large hypermetabolic mass in the pancreatic-duodenal groove with a maximum SUV of 30 corresponding to the abnormality  seen on prior images, difficult to separate from duodenum on the current exam not grossly changed compared to the recent MRI evaluation Heterogeneous hepatic activity without discrete lesion. Increased metabolic uptake along the patient's biliary drainage catheter passing through the LEFT hepatic lobe. Lymph node adjacent to the pancreatic head measuring approximately 12 mm with increased metabolic activity (image 154, series 3. Metabolic activity is difficult to separate from the adjacent pancreatic mass maximum SUV estimated to be in the range of 6.5. Hepatic gastric/celiac nodes, a cluster of lymph nodes seen on image 114 of series 3 also with some dilated vascular channels in this area metabolic activity at approximately 3.5 maximum SUV, largest node in this area approximately 6 mm. No retroperitoneal adenopathy below the renal veins. Diffuse bowel activity Incidental CT findings: Cholelithiasis. Numerous gallstones in the gallbladder. Spleen, adrenal glands and kidneys without acute process. No perinephric stranding.  Urinary bladder collapsed. No acute gastrointestinal process. Normal appendix. Uterus and adnexa unremarkable by CT. Small fat containing umbilical hernia. SKELETON: No focal hypermetabolic activity to suggest skeletal metastasis. 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 Spinal degenerative changes. IMPRESSION: Pancreatic mass with local adenopathy now with biliary drain in place. Signs of peripancreatic nodal enlargement with smaller nodes in the gastrohepatic recess. No definitive evidence of distant metastatic disease. Areas of ill-defined nodularity in the chest most notably in the RIGHT mid chest along the minor fissure, of uncertain significance. The dominant area shows tubular morphology. Close attention on follow-up is suggested, consider short interval follow-up for further evaluation. Large mediastinal/lower lobe neck mass may represent a large border and is been commented on on multiple prior chest x-rays. No comparison cross-sectional imaging is available. This area shows increased FDG uptake. Focused ultrasound and biopsy is suggested. Recommend thyroid US and biopsy yes sarcasm just cleanup this 1 finger and there is(ref: J Am Coll Radiol. 2015 Feb;12(2): 143-50). Electronically Signed: By: Zetta Bills M.D. On: 07/14/2020 15:53   IR BILIARY DRAIN PLACEMENT WITH CHOLANGIOGRAM  Result Date: 07/08/2020 INDICATION: Jaundice, biliary obstruction, pancreas cancer, successful ERCP EXAM: ULTRASOUND FLUOROSCOPIC LEFT INTERNAL EXTERNAL 10 FRENCH BILIARY DRAIN MEDICATIONS: 2 g Mefoxin; The antibiotic was administered within an appropriate time frame prior to the initiation of the procedure. ANESTHESIA/SEDATION: Moderate (conscious) sedation was employed during this procedure. A total of Versed 2.0 mg and Fentanyl 150 mcg was administered intravenously. Moderate Sedation Time: 32 minutes. The patient's level of consciousness and vital signs were monitored continuously by  radiology nursing throughout the procedure under my direct supervision. FLUOROSCOPY TIME:  Fluoroscopy Time: 5 minutes 12 seconds (89 mGy). COMPLICATIONS: None immediate. PROCEDURE: Informed written consent was obtained from the patient after a thorough discussion of the procedural risks, benefits and alternatives. All questions were addressed. Maximal Sterile Barrier Technique was utilized including caps, mask, sterile gowns, sterile gloves, sterile drape, hand hygiene and skin antiseptic. A timeout was performed prior to the initiation of the procedure. Previous imaging reviewed. Preliminary ultrasound performed of the liver. Left hepatic ducts were localized from a subxiphoid approach. Overlying skin marked. Under sterile conditions and local anesthesia, a 21 gauge 15 cm access was advanced percutaneously under ultrasound into a peripheral left hepatic duct. Needle position confirmed with ultrasound and return of clear bile. Ultrasound images obtained for documentation. Contrast injection performed for initial cholangiogram. This demonstrates diffuse biliary dilatation and distal obstruction. 018 guidewire advanced into the biliary tree followed by the transitional dilator set. Amplatz guidewire advanced followed by a Kumpe catheter. Kumpe catheter  advanced into the duodenum. Contrast injection confirms position in the duodenum. Over an Amplatz guidewire, tract dilatation performed to insert a 10 [place] internal external biliary drain. Retention loop positioned in the duodenum. Contrast injection confirms position. Images obtained for documentation. Cholangiogram confirms biliary obstruction. Cystic duct is patent with numerous gallstones noted. IMPRESSION: Successful 10 French left internal external biliary drain for distal CBD obstruction. Electronically Signed   By: Jerilynn Mages.  Shick M.D.   On: 07/08/2020 12:19    PERFORMANCE STATUS (ECOG) : 1 - Symptomatic but completely ambulatory  Review of Systems Unless  otherwise noted, a complete review of systems is negative.  Physical Exam General: NAD Pulmonary: Unlabored Extremities: no edema, no joint deformities Skin: no rashes Neurological: Weakness but otherwise nonfocal  IMPRESSION: I met with patient and daughter.  I introduced palliative care services and attempted to establish therapeutic rapport.  Patient is pending initiation of systemic chemotherapy.  Symptomatically, she has had poor oral intake but this is primarily secondary to nausea.  She has had several instances of vomiting over the past week.  She has been intermittently taking prochlorperazine and finds it helpful but short-lived.  We will send her in a prescription for ondansetron to take on a scheduled basis.  Likely nausea is secondary to her pancreatic cancer.  She denies other symptomatic complaints.  At baseline, she lives at home with her daughters.  They rotate caregiving so that she is never alone.  She uses a walker to ambulate and denies recent falls.  Patient says that she has great support at home from family.  I would like for her to meet [facility] for rehab screening given baseline weakness.   I sent patient home with ACP documents to review with family.  She will benefit from future conversation regarding MOST form.  PLAN: -Continue current scope of treatment -Ondansetron 8 mg 3 times daily as needed -ACP documents reviewed -Patient pending nutritional eval -Referral for rehab screening -Follow up My Chart visit 1 month   Patient expressed understanding and was in agreement with this plan. She also understands that She can call the clinic at any time with any questions, concerns, or complaints.     Time Total: 25 minutes  Visit consisted of counseling and education dealing with the complex and emotionally intense issues of symptom management and palliative care in the setting of serious and potentially life-threatening illness.Greater than 50%  of this time was  spent counseling and coordinating care related to the above assessment and plan.  Signed by: Altha Harm, PhD, NP-C

## 2020-07-25 NOTE — Telephone Encounter (Signed)
FMLA paperwork completed and faxed to [employer] number listed on form. The paperwork was given directly to the daughter while patient was in office being seen today.

## 2020-07-25 NOTE — Telephone Encounter (Signed)
Colette, please patient with port a cath apts.

## 2020-07-25 NOTE — Progress Notes (Signed)
Patient on schedule for [facility] Placement 07/27/2020. Spoke with patien on phone with pre procedure instructions given. Made aware to be here @ 1230, NPO after 0500,prior to procedure and driver post procedure/recovery/discharge. Also to hold metformin day of procedure. Stated understanding.

## 2020-07-25 NOTE — Telephone Encounter (Signed)
Spoke to patient in the office today. She and her caregiver are aware of the port a cath placement procedure/date/time.

## 2020-07-26 ENCOUNTER — Other Ambulatory Visit: Payer: Self-pay | Admitting: Student

## 2020-07-27 ENCOUNTER — Other Ambulatory Visit: Payer: Self-pay | Admitting: Interventional Radiology

## 2020-07-27 ENCOUNTER — Ambulatory Visit
Admission: RE | Admit: 2020-07-27 | Discharge: 2020-07-27 | Disposition: A | Discharge status: Home / Self Care | Payer: Medicare PPO | Source: Ambulatory Visit | Attending: Internal Medicine | Admitting: Internal Medicine

## 2020-07-27 ENCOUNTER — Other Ambulatory Visit: Payer: Self-pay

## 2020-07-27 ENCOUNTER — Ambulatory Visit
Admission: RE | Admit: 2020-07-27 | Discharge: 2020-07-27 | Disposition: A | Discharge status: Home / Self Care | Payer: Medicare PPO | Source: Ambulatory Visit | Attending: Interventional Radiology | Admitting: Interventional Radiology

## 2020-07-27 DIAGNOSIS — C259 Malignant neoplasm of pancreas, unspecified: Secondary | ICD-10-CM

## 2020-07-27 DIAGNOSIS — Z79899 Other long term (current) drug therapy: Secondary | ICD-10-CM | POA: Insufficient documentation

## 2020-07-27 DIAGNOSIS — K315 Obstruction of duodenum: Secondary | ICD-10-CM | POA: Diagnosis not present

## 2020-07-27 DIAGNOSIS — R112 Nausea with vomiting, unspecified: Secondary | ICD-10-CM | POA: Diagnosis not present

## 2020-07-27 HISTORY — PX: IR EXCHANGE BILIARY DRAIN: IMG6046

## 2020-07-27 HISTORY — PX: IR FLUORO GUIDE CV LINE LEFT: IMG2282

## 2020-07-27 LAB — GLUCOSE, CAPILLARY: Glucose-Capillary: 127 mg/dL — ABNORMAL HIGH (ref 70–99)

## 2020-07-27 MED ORDER — SODIUM CHLORIDE 0.9 % IV SOLN: INTRAVENOUS | Status: DC

## 2020-07-27 MED ORDER — IODIXANOL 320 MG/ML IV SOLN: 50.0000 mL | Freq: Once | INTRAVENOUS | Status: DC

## 2020-07-27 MED ORDER — FENTANYL CITRATE (PF) 100 MCG/2ML IJ SOLN
INTRAMUSCULAR | Status: AC
  Filled 2020-07-27: qty 2

## 2020-07-27 MED ORDER — MIDAZOLAM HCL 2 MG/2ML IJ SOLN
INTRAMUSCULAR | Status: AC | PRN
  Administered 2020-07-27 (×2): 1 mg via INTRAVENOUS

## 2020-07-27 MED ORDER — MIDAZOLAM HCL 2 MG/2ML IJ SOLN
INTRAMUSCULAR | Status: AC
  Filled 2020-07-27: qty 2

## 2020-07-27 MED ORDER — FENTANYL CITRATE (PF) 100 MCG/2ML IJ SOLN
INTRAMUSCULAR | Status: AC | PRN
  Administered 2020-07-27 (×2): 50 ug via INTRAVENOUS

## 2020-07-27 NOTE — H&P (Signed)
Chief Complaint: Pancreas Cancer  Referring Physician(s): Dr. Rogue Bussing  Supervising Physician: Corrie Mckusick  Patient Status: [facility] - Out-pt  History of Present Illness: Tracy Bolton is a 80 y.o. female presenting today to [facility] as outpatient referred by Dr. Rogue Bussing with pancreas cancer.   Today she has 2 requirements to address:  First, she needs venous access for future chemotherapy, and second she has an increasing total bilirubin level despite a recent transhepatic biliary drain.   Tracy Bolton was treated 07/08/20 with int/ext biliiary drainage secondary to obstruction.  At this time, total bilirubin is increasing, now above 6. I have spoken to Dr. Rogue Bussing, and he is concerned that chemotherapy might not be an option if the levels remain elevated.   This needs to be addressed, likely with a drain check and change. Tracy Bolton tells me that there has not been much drainage from the drain, which has been decreasing output.  She is not flushing it.   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  The bilateral IJ veins are somewhat distorted by the goiter, and may not be accessible.  She would need dedicated US likely to sort this out.    Past Medical History:  Diagnosis Date  . Arthritis   . Gout, chronic   . Hyperlipidemia   . Hypertension   . Osteoporosis   . Thyroid disease     Past Surgical History:  Procedure Laterality Date  . CATARACT EXTRACTION, BILATERAL  02/2018  . COLONOSCOPY  2016  . IR BILIARY DRAIN PLACEMENT WITH CHOLANGIOGRAM  07/08/2020  . LUNG SURGERY Right 2004   biopsy normal    Allergies: Patient has no known allergies.  Medications: Prior to Admission medications   Medication Sig Start Date End Date Taking? Authorizing Provider  allopurinol (ZYLOPRIM) 100 MG tablet TAKE 1 TABLET BY MOUTH EVERY DAY 09/29/19  Yes Delsa Grana, PA-C  atorvastatin (LIPITOR) 10 MG tablet TAKE 1 TABLET BY MOUTH EVERYDAY AT  BEDTIME 08/24/19  Yes Tapia, Leisa, PA-C  pantoprazole (PROTONIX) 40 MG tablet Take 1 tablet (40 mg total) by mouth daily. 07/05/20  Yes Cammie Sickle, MD  amLODipine (NORVASC) 10 MG tablet TAKE 1 TABLET BY MOUTH EVERY DAY Patient not taking: Reported on 07/27/2020 09/29/19   Delsa Grana, PA-C  aspirin 81 MG tablet Take 81 mg by mouth. Patient not taking: Reported on 07/27/2020 03/13/06   [provider]  atenolol (TENORMIN) 50 MG tablet TAKE 1 TABLET BY MOUTH EVERY DAY Patient not taking: Reported on 07/27/2020 09/29/19   Delsa Grana, PA-C  blood glucose meter kit and supplies KIT Dispense based on patient and insurance preference. Use up to four times daily as directed. 07/15/20   Cammie Sickle, MD  lidocaine-prilocaine (EMLA) cream Apply 30 -45 mins prior to port access. 07/05/20   Cammie Sickle, MD  metFORMIN (GLUCOPHAGE-XR) 500 MG 24 hr tablet TAKE 2 TABLETS BY MOUTH EVERY DAY Patient not taking: Reported on 07/27/2020 09/29/19   Delsa Grana, PA-C  nystatin (MYCOSTATIN/NYSTOP) powder APPLY TO AFFECTED AREA OF SKIN 3X A DAY AS NEEDED IN A SUFFICIENT AMOUNT 01/27/20   Delsa Grana, PA-C  ondansetron (ZOFRAN) 8 MG tablet Take 1 tablet (8 mg total) by mouth every 8 (eight) hours as needed for nausea or vomiting. 07/25/20   Borders, Kirt Boys, NP  oxyCODONE (OXY IR/ROXICODONE) 5 MG immediate release tablet Take 1 tablet (5 mg total) by mouth every 6 (six) hours as  needed for severe pain or breakthrough pain. 07/09/20   Nita Sells, MD  prochlorperazine (COMPAZINE) 10 MG tablet Take 1 tablet (10 mg total) by mouth every 6 (six) hours as needed for nausea or vomiting. 07/05/20   Cammie Sickle, MD  senna (SENOKOT) 8.6 MG TABS tablet Take 1 tablet (8.6 mg total) by mouth at bedtime as needed for mild constipation. Patient not taking: Reported on 07/27/2020 07/09/20   Nita Sells, MD     Family History  Problem Relation Age of Onset  . Bone cancer Father 91        not biological father  . Thyroid disease Sister   . Cancer Mother 39       brain tumor  . Breast cancer Neg Hx     Social History   Socioeconomic History  . Marital status: Single    Spouse name: Not on file  . Number of children: 2  . Years of education: Not on file  . Highest education level: Some college, no degree  Occupational History  . Occupation: retired  Tobacco Use  . Smoking status: Never Smoker  . Smokeless tobacco: Never Used  Vaping Use  . Vaping Use: Never used  Substance and Sexual Activity  . Alcohol use: No    Alcohol/week: 0.0 standard drinks  . Drug use: No  . Sexual activity: Not Currently  Other Topics Concern  . Not on file  Social History Narrative   Pt lives alone. With [place]- [facility] [closely -daughter, salibury]. Never smoked; no alcohol. Worked for [employer]; retd [0350]    Social Determinants of Radio broadcast assistant Strain: Low Risk   . Difficulty of Paying Living Expenses: Not hard at all  Food Insecurity: No Food Insecurity  . Worried About Charity fundraiser in the Last Year: Never true  . Ran Out of Food in the Last Year: Never true  Transportation Needs: No Transportation Needs  . Lack of Transportation (Medical): No  . Lack of Transportation (Non-Medical): No  Physical Activity: Insufficiently Active  . Days of Exercise per Week: 7 days  . Minutes of Exercise per Session: 20 min  Stress: No Stress Concern Present  . Feeling of Stress : Not at all  Social Connections: Moderately Integrated  . Frequency of Communication with Friends and Family: More than three times a week  . Frequency of Social Gatherings with Friends and Family: More than three times a week  . Attends Religious Services: More than 4 times per year  . Active Member of Clubs or Organizations: Yes  . Attends Archivist Meetings: More than 4 times per year  . Marital Status: Never married       Review of Systems: A 12 point ROS discussed  and pertinent positives are indicated in the HPI above.  All other systems are negative.  Review of Systems  Vital Signs: BP 97/71   Pulse 77   Temp (!) 97.2 F (36.2 C) (Oral)   Resp 16   Ht '5\' 2"'  (1.575 m)   Wt 79.8 kg   SpO2 97%   BMI 32.19 kg/m   Physical Exam General: 80 yo female appearing stated age.  Well-developed, well-nourished.  No distress. HEENT: Atraumatic, normocephalic.  Conjugate gaze, extra-ocular motor intact. +scleral icterus. No lesions on external ears, nose, lips, or gums.  Oral mucosa moist, pink.  Neck: Symmetric with no goiter enlargement.  Chest/Lungs:  Symmetric chest with inspiration/expiration.  No labored breathing.  Clear to auscultation with no wheezes, rhonchi, or rales.  Heart:  RRR, with no third heart sounds appreciated. No JVD appreciated.  Abdomen:  Soft, NT/ND, with + bowel sounds.   Genito-urinary: Deferred Neurologic: Alert & Oriented to person, place, and time.   Normal affect and insight.  Appropriate questions.  Moving all 4 extremities with gross sensory intact.   .     Imaging: [place] Intraoperative  Result Date: 07/08/2020 INDICATION: Jaundice, biliary obstruction, pancreas cancer, successful ERCP EXAM: ULTRASOUND FLUOROSCOPIC LEFT INTERNAL EXTERNAL 10 FRENCH BILIARY DRAIN MEDICATIONS: 2 g Mefoxin; The antibiotic was administered within an appropriate time frame prior to the initiation of the procedure. ANESTHESIA/SEDATION: Moderate (conscious) sedation was employed during this procedure. A total of Versed 2.0 mg and Fentanyl 150 mcg was administered intravenously. Moderate Sedation Time: 32 minutes. The patient's level of consciousness and vital signs were monitored continuously by radiology nursing throughout the procedure under my direct supervision. FLUOROSCOPY TIME:  Fluoroscopy Time: 5 minutes 12 seconds (89 mGy). COMPLICATIONS: None immediate. PROCEDURE: Informed written consent was obtained from the patient after a thorough discussion  of the procedural risks, benefits and alternatives. All questions were addressed. Maximal Sterile Barrier Technique was utilized including caps, mask, sterile gowns, sterile gloves, sterile drape, hand hygiene and skin antiseptic. A timeout was performed prior to the initiation of the procedure. Previous imaging reviewed. Preliminary ultrasound performed of the liver. Left hepatic ducts were localized from a subxiphoid approach. Overlying skin marked. Under sterile conditions and local anesthesia, a 21 gauge 15 cm access was advanced percutaneously under ultrasound into a peripheral left hepatic duct. Needle position confirmed with ultrasound and return of clear bile. Ultrasound images obtained for documentation. Contrast injection performed for initial cholangiogram. This demonstrates diffuse biliary dilatation and distal obstruction. 018 guidewire advanced into the biliary tree followed by the transitional dilator set. Amplatz guidewire advanced followed by a Kumpe catheter. Kumpe catheter advanced into the duodenum. Contrast injection confirms position in the duodenum. Over an Amplatz guidewire, tract dilatation performed to insert a 10 [place] internal external biliary drain. Retention loop positioned in the duodenum. Contrast injection confirms position. Images obtained for documentation. Cholangiogram confirms biliary obstruction. Cystic duct is patent with numerous gallstones noted. IMPRESSION: Successful 10 French left internal external biliary drain for distal CBD obstruction. Electronically Signed   By: Jerilynn Mages.  Shick M.D.   On: 07/08/2020 12:19   NM PET Image Initial (PI) Skull Base To Thigh  Addendum Date: 07/14/2020   ADDENDUM REPORT: 07/14/2020 16:13 ADDENDUM: Note that there is some mild to moderate airway narrowing at the thoracic inlet. The patient has a chronic mass in this area. Would still however correlate with any respiratory symptoms given the marked displacement of the trachea. For reference  the trachea in the upper chest is 15 mm and at the thoracic inlet is 9 mm greatest width. Note also that nodular areas in the chest are without increased metabolic activity. These results will be called to the ordering clinician or representative by the Radiologist Assistant, and communication documented in the PACS or [employer]. Electronically Signed   By: Zetta Bills M.D.   On: 07/14/2020 16:13   Result Date: 07/14/2020 CLINICAL DATA:  Initial treatment strategy for pancreatic cancer. EXAM: NUCLEAR MEDICINE PET SKULL BASE TO THIGH TECHNIQUE: 10.2 mCi F-18 FDG was injected intravenously. Full-ring PET imaging was performed from the skull base to thigh after the radiotracer. CT data was obtained and used for attenuation correction and anatomic localization. Fasting blood glucose:  CT of the abdomen and pelvis from May 23, 2020 mg/dl COMPARISON:  Abdominal CT from May 23, 2020 FINDINGS: Mediastinal blood pool activity: SUV max 3.7 Liver activity: SUV max NA NECK: Large RIGHT neck mass 9.6 x 7.8 cm with areas of increased FDG uptake with maximum SUV of 5.9. There is tracheal deviation that is been described on multiple prior chest x-rays dating back to 2006. No discrete adenopathy is present at the thoracic inlet or in the neck. Normal anatomy is highly distorted and the airway is narrowed secondary to mass effect from this thoracic inlet/neck mass that extends from the submandibular region into the upper chest. Incidental CT findings: none CHEST: No hypermetabolic mediastinal or hilar nodes. Scattered pulmonary nodules, described below show no increased metabolic activity above blood pool largest in the RIGHT chest with a maximum SUV of 1.2 Incidental CT findings: Calcified atheromatous plaque of the thoracic aorta. Nonaneurysmal. Mildly engorged central pulmonary vasculature 3.2 cm. Mitral annular calcifications. Normal heart size. No pericardial effusion. No adenopathy by size criteria in the  chest low-density area measuring water density in the mediastinum may represent a small pericardial cyst seen on image 62 and 63 of series 3 and shows no increased FDG uptake. Basilar atelectasis. Areas of nodularity are present, 1 area along the major fissure in the RIGHT chest measures 10 x 7 mm. Apical pleural scarring. Ill-defined nodule in the LEFT upper lobe measuring 4 mm. Another in the LEFT upper lobe measuring 4 mm on images 53 and 57 respectively. Pulmonary emphysema. ABDOMEN/PELVIS: Large hypermetabolic mass in the pancreatic-duodenal groove with a maximum SUV of 30 corresponding to the abnormality seen on prior images, difficult to separate from duodenum on the current exam not grossly changed compared to the recent MRI evaluation Heterogeneous hepatic activity without discrete lesion. Increased metabolic uptake along the patient's biliary drainage catheter passing through the LEFT hepatic lobe. Lymph node adjacent to the pancreatic head measuring approximately 12 mm with increased metabolic activity (image 675, series 3. Metabolic activity is difficult to separate from the adjacent pancreatic mass maximum SUV estimated to be in the range of 6.5. Hepatic gastric/celiac nodes, a cluster of lymph nodes seen on image 114 of series 3 also with some dilated vascular channels in this area metabolic activity at approximately 3.5 maximum SUV, largest node in this area approximately 6 mm. No retroperitoneal adenopathy below the renal veins. Diffuse bowel activity Incidental CT findings: Cholelithiasis. Numerous gallstones in the gallbladder. Spleen, adrenal glands and kidneys without acute process. No perinephric stranding. Urinary bladder collapsed. No acute gastrointestinal process. Normal appendix. Uterus and adnexa unremarkable by CT. Small fat containing umbilical hernia. SKELETON: No focal hypermetabolic activity to suggest skeletal metastasis. 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 Spinal degenerative changes. IMPRESSION: Pancreatic mass with local adenopathy now with biliary drain in place. Signs of peripancreatic nodal enlargement with smaller nodes in the gastrohepatic recess. No definitive evidence of distant metastatic disease. Areas of ill-defined nodularity in the chest most notably in the RIGHT mid chest along the minor fissure, of uncertain significance. The dominant area shows tubular morphology. Close attention on follow-up is suggested, consider short interval follow-up for further evaluation. Large mediastinal/lower lobe neck mass may represent a large border and is been commented on on multiple prior chest x-rays. No comparison cross-sectional imaging is available. This area shows increased FDG uptake. Focused ultrasound and biopsy is suggested. Recommend thyroid US and biopsy yes sarcasm just cleanup this 1 finger and there is(ref:  J Am Coll Radiol. 2015 Feb;12(2): 143-50). Electronically Signed: By: Zetta Bills M.D. On: 07/14/2020 15:53   IR BILIARY DRAIN PLACEMENT WITH CHOLANGIOGRAM  Result Date: 07/08/2020 INDICATION: Jaundice, biliary obstruction, pancreas cancer, successful ERCP EXAM: ULTRASOUND FLUOROSCOPIC LEFT INTERNAL EXTERNAL 10 FRENCH BILIARY DRAIN MEDICATIONS: 2 g Mefoxin; The antibiotic was administered within an appropriate time frame prior to the initiation of the procedure. ANESTHESIA/SEDATION: Moderate (conscious) sedation was employed during this procedure. A total of Versed 2.0 mg and Fentanyl 150 mcg was administered intravenously. Moderate Sedation Time: 32 minutes. The patient's level of consciousness and vital signs were monitored continuously by radiology nursing throughout the procedure under my direct supervision. FLUOROSCOPY TIME:  Fluoroscopy Time: 5 minutes 12 seconds (89 mGy). COMPLICATIONS: None immediate. PROCEDURE: Informed written consent was obtained from the patient after a thorough discussion of the procedural  risks, benefits and alternatives. All questions were addressed. Maximal Sterile Barrier Technique was utilized including caps, mask, sterile gowns, sterile gloves, sterile drape, hand hygiene and skin antiseptic. A timeout was performed prior to the initiation of the procedure. Previous imaging reviewed. Preliminary ultrasound performed of the liver. Left hepatic ducts were localized from a subxiphoid approach. Overlying skin marked. Under sterile conditions and local anesthesia, a 21 gauge 15 cm access was advanced percutaneously under ultrasound into a peripheral left hepatic duct. Needle position confirmed with ultrasound and return of clear bile. Ultrasound images obtained for documentation. Contrast injection performed for initial cholangiogram. This demonstrates diffuse biliary dilatation and distal obstruction. 018 guidewire advanced into the biliary tree followed by the transitional dilator set. Amplatz guidewire advanced followed by a Kumpe catheter. Kumpe catheter advanced into the duodenum. Contrast injection confirms position in the duodenum. Over an Amplatz guidewire, tract dilatation performed to insert a 10 [place] internal external biliary drain. Retention loop positioned in the duodenum. Contrast injection confirms position. Images obtained for documentation. Cholangiogram confirms biliary obstruction. Cystic duct is patent with numerous gallstones noted. IMPRESSION: Successful 10 French left internal external biliary drain for distal CBD obstruction. Electronically Signed   By: Jerilynn Mages.  Shick M.D.   On: 07/08/2020 12:19    Labs:  CBC: Recent Labs    07/08/20 0517 07/09/20 0405 07/15/20 1037 07/22/20 0815  WBC 7.9 9.0 9.8 9.2  HGB 11.1* 11.3* 13.1 12.9  HCT 34.2* 34.4* 39.8 39.7  PLT 165 161 225 280    COAGS: Recent Labs    07/07/20 1333 07/08/20 0517 07/09/20 0405  INR 1.2 1.2 1.1  APTT 24  --   --     BMP: Recent Labs    10/06/19 1056 04/08/20 1139 05/22/20 2212  07/08/20 0517 07/09/20 0405 07/15/20 1037 07/22/20 0815  NA 140 140   < > 141 138 135 139  K 4.6 4.3   < > 3.9 3.6 4.0 4.0  CL 102 103   < > 104 101 97* 100  CO2 26 24   < > 24 25 20* 21*  GLUCOSE 150* 156*   < > 99 103* 141* 132*  BUN 20 22   < > 13 10 30* 21  CALCIUM 10.0 10.0   < > 9.1 9.0 9.4 10.0  CREATININE 1.17* 0.96*   < > 1.04* 0.85 1.61* 1.32*  GFRNONAA 45* 57*   < > 55* >60 33* 42*  GFRAA 52* 66  --   --   --   --   --    < > = values in this interval not displayed.    LIVER FUNCTION  TESTS: Recent Labs    07/08/20 0517 07/09/20 0405 07/15/20 1037 07/22/20 0815  BILITOT 5.6* 4.9* 6.2* 6.5*  AST 192* 227* 236* 251*  ALT 104* 119* 109* 123*  ALKPHOS 167* 196* 151* 137*  PROT 6.8 7.1 8.2* 8.5*  ALBUMIN 3.2* 3.2* 3.5 3.5    TUMOR MARKERS: No results for input(s): AFPTM, CEA, CA199, CHROMGRNA in the last 8760 hours.  Assessment and Plan:  Tracy Bolton is 80 year old female with pancreas cancer, presenting today with increasing bilirubin in the presence of a prior int/ext drain, and with needs for IV access to potentially start chemotherapy.   I have discussed her case with Dr. Rogue Bussing, including the need to investigate the drain, potentially blocked, and possibly exchange the drain.    Additionally, the large thyroid goiter is distorting the IJ veins, and we have decided to offer a PICC so that she might start chemotherapy when the Tbili is lower.   If she requires alternative more permanent access such as a port, we can potentially discuss with the patient the possibility of either a hip port or subclavian port, versus imaging anatomic workup of the jugular anatomy.   Risks and benefits of both PICC placement as well as drain exchange discussed with the patient including bleeding, infection, thrombus, damage to adjacent structures, bowel perforation/fistula connection, and sepsis.  All of the patient's questions were answered, patient is agreeable to  proceed. Consent signed and in chart.   Electronically Signed: Corrie Mckusick, DO 07/27/2020, 1:47 PM   I spent a total of  40 Minutes   in face to face in clinical consultation, greater than 50% of which was counseling/coordinating care for possible PICC, possible int/ext biliary drain evaluation/exchange.

## 2020-07-27 NOTE — Procedures (Signed)
Interventional Radiology Procedure Note  Procedure: Placement of a right brachial vein approach double lumen PowerPICC.  Tip is positioned at the superior cavoatrial junction and catheter is ready for immediate use.   Exchange of partially occluded int/ext biliary drain, with new 46F drain to gravity.  Complications: None  Recommendations:  - Ok to use PICC - routine care - int/ext to gravity - routine care - DC 1 hr after sedation  Signed,  Dulcy Fanny. Earleen Newport, DO

## 2020-07-28 ENCOUNTER — Inpatient Hospital Stay: Payer: Medicare PPO

## 2020-07-28 ENCOUNTER — Encounter: Payer: Self-pay | Admitting: Internal Medicine

## 2020-07-28 ENCOUNTER — Inpatient Hospital Stay
Admission: EM | Admit: 2020-07-28 | Discharge: 2020-07-31 | DRG: 380 | Disposition: A | Discharge status: Other Acute Inpatient Hospital | Payer: Medicare PPO | Source: Ambulatory Visit | Attending: Internal Medicine | Admitting: Internal Medicine

## 2020-07-28 ENCOUNTER — Inpatient Hospital Stay (HOSPITAL_BASED_OUTPATIENT_CLINIC_OR_DEPARTMENT_OTHER): Payer: Medicare PPO | Admitting: Internal Medicine

## 2020-07-28 ENCOUNTER — Emergency Department: Payer: Medicare PPO

## 2020-07-28 ENCOUNTER — Observation Stay: Payer: Medicare PPO

## 2020-07-28 DIAGNOSIS — E782 Mixed hyperlipidemia: Secondary | ICD-10-CM

## 2020-07-28 DIAGNOSIS — R112 Nausea with vomiting, unspecified: Secondary | ICD-10-CM

## 2020-07-28 DIAGNOSIS — N1831 Chronic kidney disease, stage 3a: Secondary | ICD-10-CM | POA: Diagnosis present

## 2020-07-28 DIAGNOSIS — Z7409 Other reduced mobility: Secondary | ICD-10-CM | POA: Diagnosis present

## 2020-07-28 DIAGNOSIS — Z6832 Body mass index (BMI) 32.0-32.9, adult: Secondary | ICD-10-CM

## 2020-07-28 DIAGNOSIS — R627 Adult failure to thrive: Secondary | ICD-10-CM | POA: Diagnosis present

## 2020-07-28 DIAGNOSIS — Z8349 Family history of other endocrine, nutritional and metabolic diseases: Secondary | ICD-10-CM

## 2020-07-28 DIAGNOSIS — K219 Gastro-esophageal reflux disease without esophagitis: Secondary | ICD-10-CM | POA: Diagnosis present

## 2020-07-28 DIAGNOSIS — R29898 Other symptoms and signs involving the musculoskeletal system: Secondary | ICD-10-CM | POA: Diagnosis present

## 2020-07-28 DIAGNOSIS — Z452 Encounter for adjustment and management of vascular access device: Secondary | ICD-10-CM

## 2020-07-28 DIAGNOSIS — M1A9XX0 Chronic gout, unspecified, without tophus (tophi): Secondary | ICD-10-CM | POA: Diagnosis present

## 2020-07-28 DIAGNOSIS — Z79899 Other long term (current) drug therapy: Secondary | ICD-10-CM

## 2020-07-28 DIAGNOSIS — C259 Malignant neoplasm of pancreas, unspecified: Secondary | ICD-10-CM | POA: Diagnosis present

## 2020-07-28 DIAGNOSIS — E66813 Obesity, class 3: Secondary | ICD-10-CM | POA: Diagnosis present

## 2020-07-28 DIAGNOSIS — R5383 Other fatigue: Secondary | ICD-10-CM | POA: Diagnosis not present

## 2020-07-28 DIAGNOSIS — E669 Obesity, unspecified: Secondary | ICD-10-CM | POA: Diagnosis present

## 2020-07-28 DIAGNOSIS — E785 Hyperlipidemia, unspecified: Secondary | ICD-10-CM | POA: Diagnosis present

## 2020-07-28 DIAGNOSIS — Z789 Other specified health status: Secondary | ICD-10-CM | POA: Diagnosis not present

## 2020-07-28 DIAGNOSIS — N179 Acute kidney failure, unspecified: Secondary | ICD-10-CM | POA: Diagnosis not present

## 2020-07-28 DIAGNOSIS — Z808 Family history of malignant neoplasm of other organs or systems: Secondary | ICD-10-CM

## 2020-07-28 DIAGNOSIS — C250 Malignant neoplasm of head of pancreas: Secondary | ICD-10-CM | POA: Diagnosis present

## 2020-07-28 DIAGNOSIS — Z515 Encounter for palliative care: Secondary | ICD-10-CM

## 2020-07-28 DIAGNOSIS — K831 Obstruction of bile duct: Secondary | ICD-10-CM | POA: Diagnosis not present

## 2020-07-28 DIAGNOSIS — R911 Solitary pulmonary nodule: Secondary | ICD-10-CM | POA: Diagnosis not present

## 2020-07-28 DIAGNOSIS — K315 Obstruction of duodenum: Principal | ICD-10-CM | POA: Diagnosis present

## 2020-07-28 DIAGNOSIS — I959 Hypotension, unspecified: Secondary | ICD-10-CM | POA: Diagnosis present

## 2020-07-28 DIAGNOSIS — K8689 Other specified diseases of pancreas: Secondary | ICD-10-CM

## 2020-07-28 DIAGNOSIS — Z20822 Contact with and (suspected) exposure to covid-19: Secondary | ICD-10-CM | POA: Diagnosis present

## 2020-07-28 DIAGNOSIS — R7401 Elevation of levels of liver transaminase levels: Secondary | ICD-10-CM | POA: Diagnosis present

## 2020-07-28 DIAGNOSIS — K920 Hematemesis: Secondary | ICD-10-CM | POA: Diagnosis present

## 2020-07-28 DIAGNOSIS — I129 Hypertensive chronic kidney disease with stage 1 through stage 4 chronic kidney disease, or unspecified chronic kidney disease: Secondary | ICD-10-CM | POA: Diagnosis present

## 2020-07-28 DIAGNOSIS — Z7984 Long term (current) use of oral hypoglycemic drugs: Secondary | ICD-10-CM

## 2020-07-28 DIAGNOSIS — K922 Gastrointestinal hemorrhage, unspecified: Secondary | ICD-10-CM

## 2020-07-28 HISTORY — DX: Acute kidney failure, unspecified: N17.9

## 2020-07-28 LAB — MAGNESIUM: Magnesium: 2.3 mg/dL (ref 1.7–2.4)

## 2020-07-28 LAB — COMPREHENSIVE METABOLIC PANEL
ALT: 148 U/L — ABNORMAL HIGH (ref 0–44)
AST: 251 U/L — ABNORMAL HIGH (ref 15–41)
Albumin: 3.6 g/dL (ref 3.5–5.0)
Alkaline Phosphatase: 119 U/L (ref 38–126)
Anion gap: 20 — ABNORMAL HIGH (ref 5–15)
BUN: 41 mg/dL — ABNORMAL HIGH (ref 8–23)
CO2: 19 mmol/L — ABNORMAL LOW (ref 22–32)
Calcium: 10.2 mg/dL (ref 8.9–10.3)
Chloride: 105 mmol/L (ref 98–111)
Creatinine, Ser: 2.13 mg/dL — ABNORMAL HIGH (ref 0.44–1.00)
GFR, Estimated: 23 mL/min — ABNORMAL LOW (ref >60)
Glucose, Bld: 188 mg/dL — ABNORMAL HIGH (ref 70–99)
Potassium: 3.9 mmol/L (ref 3.5–5.1)
Sodium: 144 mmol/L (ref 135–145)
Total Bilirubin: 6.7 mg/dL — ABNORMAL HIGH (ref 0.3–1.2)
Total Protein: 8.6 g/dL — ABNORMAL HIGH (ref 6.5–8.1)

## 2020-07-28 LAB — TROPONIN I (HIGH SENSITIVITY)
Troponin I (High Sensitivity): 10 ng/L (ref <18)
Troponin I (High Sensitivity): 10 ng/L (ref <18)

## 2020-07-28 LAB — RESP PANEL BY RT-PCR (FLU A&B, COVID) ARPGX2
Influenza A by PCR: NEGATIVE
Influenza B by PCR: NEGATIVE
SARS Coronavirus 2 by RT PCR: NEGATIVE

## 2020-07-28 LAB — CBC WITH DIFFERENTIAL/PLATELET
Abs Immature Granulocytes: 0.04 10*3/uL (ref 0.00–0.07)
Basophils Absolute: 0.1 10*3/uL (ref 0.0–0.1)
Basophils Relative: 1 %
Eosinophils Absolute: 0 10*3/uL (ref 0.0–0.5)
Eosinophils Relative: 0 %
HCT: 41.4 % (ref 36.0–46.0)
Hemoglobin: 13.3 g/dL (ref 12.0–15.0)
Immature Granulocytes: 0 %
Lymphocytes Relative: 20 %
Lymphs Abs: 2.3 10*3/uL (ref 0.7–4.0)
MCH: 27.1 pg (ref 26.0–34.0)
MCHC: 32.1 g/dL (ref 30.0–36.0)
MCV: 84.3 fL (ref 80.0–100.0)
Monocytes Absolute: 1 10*3/uL (ref 0.1–1.0)
Monocytes Relative: 9 %
Neutro Abs: 8 10*3/uL — ABNORMAL HIGH (ref 1.7–7.7)
Neutrophils Relative %: 70 %
Platelets: 260 10*3/uL (ref 150–400)
RBC: 4.91 MIL/uL (ref 3.87–5.11)
RDW: 17.8 % — ABNORMAL HIGH (ref 11.5–15.5)
WBC: 11.3 10*3/uL — ABNORMAL HIGH (ref 4.0–10.5)
nRBC: 0 % (ref 0.0–0.2)

## 2020-07-28 LAB — HEPATIC FUNCTION PANEL
ALT: 145 U/L — ABNORMAL HIGH (ref 0–44)
AST: 239 U/L — ABNORMAL HIGH (ref 15–41)
Albumin: 3.7 g/dL (ref 3.5–5.0)
Alkaline Phosphatase: 121 U/L (ref 38–126)
Bilirubin, Direct: 3.2 mg/dL — ABNORMAL HIGH (ref 0.0–0.2)
Indirect Bilirubin: 3.6 mg/dL — ABNORMAL HIGH (ref 0.3–0.9)
Total Bilirubin: 6.8 mg/dL — ABNORMAL HIGH (ref 0.3–1.2)
Total Protein: 8.6 g/dL — ABNORMAL HIGH (ref 6.5–8.1)

## 2020-07-28 LAB — LACTIC ACID, PLASMA
Lactic Acid, Venous: 3.2 mmol/L (ref 0.5–1.9)
Lactic Acid, Venous: 1.4 mmol/L (ref 0.5–1.9)

## 2020-07-28 LAB — LIPASE, BLOOD: Lipase: 76 U/L — ABNORMAL HIGH (ref 11–51)

## 2020-07-28 LAB — CBG MONITORING, ED: Glucose-Capillary: 185 mg/dL — ABNORMAL HIGH (ref 70–99)

## 2020-07-28 LAB — PROTIME-INR
INR: 1.2 (ref 0.8–1.2)
Prothrombin Time: 14.8 seconds (ref 11.4–15.2)

## 2020-07-28 LAB — AMMONIA: Ammonia: 18 umol/L (ref 9–35)

## 2020-07-28 MED ORDER — SODIUM CHLORIDE 0.9% FLUSH
10.0000 mL | Freq: Once | INTRAVENOUS | Status: AC
Start: 2020-07-28 — End: 2020-07-28
  Administered 2020-07-28: 10 mL via INTRAVENOUS
  Filled 2020-07-28: qty 10

## 2020-07-28 MED ORDER — HEPARIN SOD (PORK) LOCK FLUSH 100 UNIT/ML IV SOLN
500.0000 [IU] | Freq: Once | INTRAVENOUS | Status: DC
  Filled 2020-07-28: qty 5

## 2020-07-28 MED ORDER — PROCHLORPERAZINE MALEATE 10 MG PO TABS
10.0000 mg | ORAL_TABLET | Freq: Four times a day (QID) | ORAL | Status: DC | PRN
  Filled 2020-07-28: qty 1

## 2020-07-28 MED ORDER — ONDANSETRON HCL 4 MG/2ML IJ SOLN
4.0000 mg | Freq: Once | INTRAMUSCULAR | Status: AC
  Administered 2020-07-28: 4 mg via INTRAVENOUS
  Filled 2020-07-28: qty 2

## 2020-07-28 MED ORDER — ONDANSETRON HCL 4 MG/2ML IJ SOLN
4.0000 mg | Freq: Four times a day (QID) | INTRAMUSCULAR | Status: DC | PRN
  Administered 2020-07-28 – 2020-07-31 (×4): 4 mg via INTRAVENOUS
  Filled 2020-07-28 (×4): qty 2

## 2020-07-28 MED ORDER — ALLOPURINOL 100 MG PO TABS
100.0000 mg | ORAL_TABLET | Freq: Every day | ORAL | Status: DC
  Administered 2020-07-28: 100 mg via ORAL
  Filled 2020-07-28 (×3): qty 1

## 2020-07-28 MED ORDER — PROCHLORPERAZINE EDISYLATE 10 MG/2ML IJ SOLN
10.0000 mg | Freq: Four times a day (QID) | INTRAMUSCULAR | Status: DC | PRN
  Administered 2020-07-28: 10 mg via INTRAVENOUS
  Filled 2020-07-28: qty 2

## 2020-07-28 MED ORDER — ONDANSETRON HCL 4 MG PO TABS: 4.0000 mg | ORAL_TABLET | Freq: Four times a day (QID) | ORAL | Status: DC | PRN

## 2020-07-28 MED ORDER — HEPARIN SODIUM (PORCINE) 5000 UNIT/ML IJ SOLN
5000.0000 [IU] | Freq: Three times a day (TID) | INTRAMUSCULAR | Status: DC
  Administered 2020-07-28: 5000 [IU] via SUBCUTANEOUS
  Filled 2020-07-28: qty 1

## 2020-07-28 MED ORDER — LACTATED RINGERS IV SOLN: INTRAVENOUS | Status: AC

## 2020-07-28 MED ORDER — SODIUM CHLORIDE 0.9 % IV SOLN
6.2500 mg | Freq: Four times a day (QID) | INTRAVENOUS | Status: DC | PRN
  Filled 2020-07-28: qty 0.25

## 2020-07-28 MED ORDER — OXYCODONE HCL 5 MG PO TABS: 5.0000 mg | ORAL_TABLET | Freq: Four times a day (QID) | ORAL | Status: DC | PRN

## 2020-07-28 MED ORDER — ASPIRIN EC 81 MG PO TBEC: 81.0000 mg | DELAYED_RELEASE_TABLET | Freq: Every day | ORAL | Status: DC

## 2020-07-28 MED ORDER — SODIUM CHLORIDE 0.9 % IV BOLUS
1000.0000 mL | Freq: Once | INTRAVENOUS | Status: AC
  Administered 2020-07-28: 1000 mL via INTRAVENOUS

## 2020-07-28 MED ORDER — ACETAMINOPHEN 325 MG PO TABS: 650.0000 mg | ORAL_TABLET | Freq: Four times a day (QID) | ORAL | Status: AC | PRN

## 2020-07-28 MED ORDER — PANTOPRAZOLE SODIUM 40 MG IV SOLR
40.0000 mg | INTRAVENOUS | Status: DC
  Administered 2020-07-28: 40 mg via INTRAVENOUS
  Filled 2020-07-28: qty 40

## 2020-07-28 MED ORDER — ACETAMINOPHEN 650 MG RE SUPP: 650.0000 mg | Freq: Four times a day (QID) | RECTAL | Status: AC | PRN

## 2020-07-28 NOTE — ED Notes (Signed)
Brought to ED via wheelchair from [facility]. Pt was reported to be lethargic and hypotensive systolic in 24'M. CBG checked by this RN 185 and VS taken. Pt alert to verbal stimulation. Daughter on way.

## 2020-07-28 NOTE — H&P (Addendum)
History and Physical   Tracy Bolton UYQ:[id number] DOB: 1941/01/02 DOA: 07/28/2020  PCP: Delsa Grana, PA-C  Outpatient Specialists: Dr. Rogue Bussing Patient coming from: Oncology clinic  I have personally briefly reviewed patient's old medical records in [place].  Chief Concern: Intractable nausea and vomiting  HPI: Tracy Bolton is a 80 y.o. female with medical history significant for pancreatic cancer diagnosed in February 2022, presents to the emergency department from oncology clinic for chief concerns of intractable nausea and vomiting.  At bedside, patient was able to tell me her name age, location of hospital.  She reports that she Vomitted 3-4x on 07/27/20 with yellow bile. She reports being nauseous all day. She endorses nausea at bedside.  Per report, her last PO intake of food was Tuesday, 07/26/20 with potstickers  Social history: lives at home by herself. Has help from Tracy Bolton and another daughter. She denies tobacco use, etoh, and recreational drug use. She is retired and formerly worked as a [employer]ar[employer] manager.  Vaccination: she is vaccinated for covid 19, three shots  ROS: Constitutional: no weight change, no fever ENT/Mouth: no sore throat, no rhinorrhea Eyes: no eye pain, no vision changes Cardiovascular: no chest pain, no dyspnea,  no edema, no palpitations Respiratory: no cough, no sputum, no wheezing Gastrointestinal: + nausea, + vomiting, no diarrhea, no constipation Genitourinary: no urinary incontinence, no dysuria, no hematuria Musculoskeletal: no arthralgias, no myalgias Skin: no skin lesions, no pruritus, Neuro: + weakness, no loss of consciousness, no syncope Psych: no anxiety, no depression, + decrease appetite Heme/Lymph: no bruising, no bleeding  ED Course: Discussed with ED provider, patient requiring hospitalization for intractable nausea and vomiting.  Vitals in the emergency department was remarkable for temperature of 98,  respiration rate of 16, heart rate 69, blood pressure 92/59, SPO2 99% on room air.  She status post 1 L bolus.  COVID was negative, influenza A and B were negative  Labs in the emergency department was remarkable for WBC 11.3, hemoglobin 13.3, platelets 260, sodium 144, potassium 3.9, chloride 105, bicarb 19, BUN 41, serum creatinine of 2.13, nonfasting blood glucose 188.  Lipase was 76, AST 239, ALT 145, troponin was 10 and was unchanged to 10. Lactic acid was 3.2 and improved to 1.4.  ED provider called Dr. Rogue Bussing and he would like a barium swallow to assess for need of a duodenal stent which would need to be done at [facility].  Assessment/Plan  Active Problems:   Weakness of both lower extremities   Obesity, Class III, BMI 40-49.9 (morbid obesity) (HCC)   Hyperlipidemia   Elevated serum glutamic pyruvic transaminase (SGPT) level   Full code status   Limited mobility   Primary pancreatic cancer (HCC)   Biliary obstruction   AKI (acute kidney injury) (HCC)   Intractable nausea and vomiting-supportive care including ondansetron, lactated Ringer's 125 mL/h, for 1 day -Etiology concerning for duodenal obstruction -Per Dr. Rogue Bussing he would like a barium swallow  -Barium swallow has been ordered -Patient is to be n.p.o. after midnight - Patient had an EGD at [facility] and EGD could not be completed due to extraluminal obstruction - If there is duodenal obstruction, patient would need to be transferred to [facility] for a duodenal stent - Allopurinol 100 mg daily  Acute kidney injury- -Baseline CKD 3A/II  likely prerenal in setting of poor p.o. intake - IV fluid as above  Primary pancreatic cancer  GERD-Protonix  Failure to thrive/poor p.o. intake-dietitian has been consulted for supplements recommendation  Pain control-oxycodone 5 mg every 6 hours as needed for severe pain  Chart reviewed.   DVT prophylaxis: Heparin 5000 units every 8 hours, TED hose Code Status: Full  code Diet: Regular diet, n.p.o. at midnight Family Communication: updated daughter, Tracy Bolton at bedside Disposition Plan: Pending clinical course Consults called: None at this time Admission status: MedSurg, observation, with telemetry, 24 hours ordered  Past Medical History:  Diagnosis Date  . AKI (acute kidney injury) ([place]) 07/28/2020  . Arthritis   . Gout, chronic   . Hyperlipidemia   . Hypertension   . Osteoporosis   . Thyroid disease    Past Surgical History:  Procedure Laterality Date  . CATARACT EXTRACTION, BILATERAL  02/2018  . COLONOSCOPY  2016  . IR BILIARY DRAIN PLACEMENT WITH CHOLANGIOGRAM  07/08/2020  . IR EXCHANGE BILIARY DRAIN  07/27/2020  . IR FLUORO GUIDE CV LINE LEFT  07/27/2020  . LUNG SURGERY Right 2004   biopsy normal   Social History:  reports that she has never smoked. She has never used smokeless tobacco. She reports that she does not drink alcohol and does not use drugs.  No Known Allergies Family History  Problem Relation Age of Onset  . Bone cancer Father 60       not biological father  . Thyroid disease Sister   . Cancer Mother 36       brain tumor  . Breast cancer Neg Hx    Family history: Family history reviewed and not pertinent  Prior to Admission medications   Medication Sig Start Date End Date Taking? Authorizing Provider  allopurinol (ZYLOPRIM) 100 MG tablet TAKE 1 TABLET BY MOUTH EVERY DAY 09/29/19   Delsa Grana, PA-C  amLODipine (NORVASC) 10 MG tablet TAKE 1 TABLET BY MOUTH EVERY DAY Patient not taking: No sig reported 09/29/19   Delsa Grana, PA-C  aspirin 81 MG tablet Take 81 mg by mouth. Patient not taking: No sig reported 03/13/06   [provider]  atenolol (TENORMIN) 50 MG tablet TAKE 1 TABLET BY MOUTH EVERY DAY Patient not taking: No sig reported 09/29/19   Delsa Grana, PA-C  atorvastatin (LIPITOR) 10 MG tablet TAKE 1 TABLET BY MOUTH EVERYDAY AT BEDTIME 08/24/19   Delsa Grana, PA-C  blood glucose meter kit and supplies KIT  Dispense based on patient and insurance preference. Use up to four times daily as directed. 07/15/20   Cammie Sickle, MD  lidocaine-prilocaine (EMLA) cream Apply 30 -45 mins prior to port access. 07/05/20   Cammie Sickle, MD  metFORMIN (GLUCOPHAGE-XR) 500 MG 24 hr tablet TAKE 2 TABLETS BY MOUTH EVERY DAY Patient not taking: No sig reported 09/29/19   Delsa Grana, PA-C  nystatin (MYCOSTATIN/NYSTOP) powder APPLY TO AFFECTED AREA OF SKIN 3X A DAY AS NEEDED IN A SUFFICIENT AMOUNT 01/27/20   Delsa Grana, PA-C  ondansetron (ZOFRAN) 8 MG tablet Take 1 tablet (8 mg total) by mouth every 8 (eight) hours as needed for nausea or vomiting. 07/25/20   Borders, Kirt Boys, NP  oxyCODONE (OXY IR/ROXICODONE) 5 MG immediate release tablet Take 1 tablet (5 mg total) by mouth every 6 (six) hours as needed for severe pain or breakthrough pain. 07/09/20   Nita Sells, MD  pantoprazole (PROTONIX) 40 MG tablet Take 1 tablet (40 mg total) by mouth daily. 07/05/20   Cammie Sickle, MD  prochlorperazine (COMPAZINE) 10 MG tablet Take 1 tablet (10 mg total) by mouth every 6 (six) hours as needed for nausea or vomiting. 07/05/20  Cammie Sickle, MD  senna (SENOKOT) 8.6 MG TABS tablet Take 1 tablet (8.6 mg total) by mouth at bedtime as needed for mild constipation. Patient not taking: No sig reported 07/09/20   Nita Sells, MD   Physical Exam: Vitals:   07/28/20 1530 07/28/20 1617 07/28/20 2013 07/28/20 2354  BP: 108/73 113/74 115/70 99/64  Pulse: 70 76 77 92  Resp:  '20 18 18  ' Temp:  97.6 F (36.4 C) 97.8 F (36.6 C) 98 F (36.7 C)  TempSrc:  Oral Oral Oral  SpO2: 98% 100% 100% 98%  Weight:      Height:       Constitutional: appears age-appropriate, NAD, calm, comfortable Eyes: PERRL, lids and conjunctivae normal ENMT: Mucous membranes are moist. Posterior pharynx clear of any exudate or lesions. Age-appropriate dentition. Hearing appropriate Neck: normal, supple, no masses, no  thyromegaly Respiratory: clear to auscultation bilaterally, no wheezing, no crackles. Normal respiratory effort. No accessory muscle use.  Cardiovascular: Regular rate and rhythm, no murmurs / rubs / gallops. No extremity edema. 2+ pedal pulses. No carotid bruits.  Abdomen: Obese abdomen no tenderness, no masses palpated, no hepatosplenomegaly. Bowel sounds positive.  Musculoskeletal: no clubbing / cyanosis. No joint deformity upper and lower extremities. Good ROM, no contractures, no atrophy. Normal muscle tone.  Skin: no rashes, lesions, ulcers. No induration Neurologic: Sensation intact. Strength 5/5 in all 4.  Psychiatric: Normal judgment and insight. Alert and oriented x 3. Normal mood.   EKG: independently reviewed, showing sinus rhythm rate of 72, QTc 468  Chest x-ray on Admission: I personally reviewed and I agree with radiologist reading as below.  CT ABDOMEN PELVIS WO CONTRAST  Result Date: 07/28/2020 CLINICAL DATA:  Abdominal distension, lethargic and hypotensive. History of pancreatic cancer in this 80 year old female with transit padded a percutaneous biliary drainage catheter in place. EXAM: CT ABDOMEN AND PELVIS WITHOUT CONTRAST TECHNIQUE: Multidetector CT imaging of the abdomen and pelvis was performed following the standard protocol without IV contrast. COMPARISON:  May 23, 2020 and PET imaging as well as MR imaging since that time most recent cross-sectional comparison from July 14, 2020. FINDINGS: Lower chest: Nodular area along the major fissure in the RIGHT chest stable when compared to recent PET-CT 10 x 7 mm. No effusion. No consolidation. Hepatobiliary: Liver with smooth contours. Percutaneous biliary drainage catheter in place, tip in the duodenum traversing LEFT hepatic lobe with side-hole marker well within the hepatic substance. Side-hole marker 5 cm into the liver. No perihepatic stranding at the site of entry. Contrast mixed with numerous stones in the gallbladder.  Gallbladder is distended without signs of pericholecystic stranding or fluid. No focal hepatic abnormality. Pancreas: Pancreas with stable appearance compared to recent PET imaging aside from potential mild peripancreatic stranding. Ill-defined mass in the pancreatic head not grossly changed. Spleen: Normal spleen. Adrenals/Urinary Tract: Normal appearance of adrenal glands. No perinephric stranding. No hydronephrosis. No perivesical stranding. No nephrolithiasis. Stomach/Bowel: No perianal stranding. Colonic diverticulosis without signs of diverticulitis. Normal appendix. Small hiatal hernia. No perigastric stranding. Small bowel is normal in caliber. No adjacent stranding. Vascular/Lymphatic: Calcified atheromatous plaque of the abdominal aorta. Small lymph nodes adjacent to pancreas not well evaluated. No new retroperitoneal adenopathy. Insert no pelvic adenopathy. Reproductive: No adnexal masses. Other: No free air. No ascites. Small fat containing umbilical hernia. Musculoskeletal: Spinal degenerative changes. No acute or destructive bone process. Degenerative changes of the bilateral hips with severe degenerative change. IMPRESSION: 1. Query mild peripancreatic stranding. Correlate with pancreatic enzymes. 2.  Percutaneous biliary drainage catheter in place, tip in the duodenum traversing LEFT hepatic lobe with side-hole marker well within the hepatic substance. w no acute hepatic findings on noncontrast imaging. 3. Cholelithiasis with contrast in a distended gallbladder. No adjacent stranding or fluid. 4. Ill-defined mass in the pancreatic head not grossly changed. 5. Colonic diverticulosis without signs of diverticulitis. 6. Nodular area along the major fissure in the RIGHT chest stable when compared to recent PET-CT 10 x 7 mm. Attention on follow-up. 7. Aortic atherosclerosis. Aortic Atherosclerosis (ICD10-I70.0). Electronically Signed   By: Zetta Bills M.D.   On: 07/28/2020 11:56   CT Head Wo  Contrast  Result Date: 07/28/2020 CLINICAL DATA:  Migraine-type headache.  Lethargy.  Hypotension. EXAM: CT HEAD WITHOUT CONTRAST TECHNIQUE: Contiguous axial images were obtained from the base of the skull through the vertex without intravenous contrast. COMPARISON:  08/21/2013. FINDINGS: Brain: No evidence of acute infarction, hemorrhage, hydrocephalus, extra-axial collection or mass lesion/mass effect. Vascular: No hyperdense vessel or unexpected calcification. Skull: Normal. Negative for fracture or focal lesion. Sinuses/Orbits: Visualized globes and orbits are unremarkable. Visualized sinuses are clear. Other: None. IMPRESSION: Negative unenhanced CT scan of the brain for age. Electronically Signed   By: Lajean Manes M.D.   On: 07/28/2020 11:36   IR Fluoro Guide CV Line Left  Result Date: 07/27/2020 INDICATION: 80 year old female with pancreatic carcinoma, prior internal external biliary drain now with suspected occlusion. She presents for IV access for possible chemotherapy and for exchange of the nonfunctional biliary drain. EXAM: IMAGE GUIDED RIGHT UPPER EXTREMITY PICC IMAGE GUIDED EXCHANGE INTERNAL EXTERNAL BILIARY DRAIN MEDICATIONS: None ANESTHESIA/SEDATION: Moderate (conscious) sedation was employed during this procedure. A total of Versed 2.0 mg and Fentanyl 100 mcg was administered intravenously. Moderate Sedation Time: 20 minutes. The patient's level of consciousness and vital signs were monitored continuously by radiology nursing throughout the procedure under my direct supervision. FLUOROSCOPY TIME:  Fluoroscopy Time: 1 minutes 0 seconds (14.7 mGy). COMPLICATIONS: None PROCEDURE: Informed written consent was obtained from the patient after a thorough discussion of the procedural risks, benefits and alternatives. All questions were addressed. Maximal Sterile Barrier Technique was utilized including caps, mask, sterile gowns, sterile gloves, sterile drape, hand hygiene and skin antiseptic. A  timeout was performed prior to the initiation of the procedure. Patient was position in the supine position on the fluoroscopy table with the right arm abducted 90 degrees. Ultrasound survey of the upper extremity was performed with images stored and sent to PACs. The right brachial vein was selected for access. Once the patient was prepped and draped in the usual sterile fashion, the skin and subcutaneous tissues were generously infiltrated with 1% lidocaine for local anesthesia. A micropuncture access kit was then used to access the targeted vein. Wire was passed centrally, confirmed to be within the venous system under fluoroscopy. A small stab incision was made with an 11 blade scalpel and the sheath was then placed over the wire. Estimated length of the catheter was then performed with the indwelling wire. Catheter was amputated at 39 cm length and placed with coaxial wire through the peel-away. Double lumen, power injectable PICC in the brachial vein. Tip confirmed at the cavoatrial junction, and the catheter is ready for use. Stat lock was placed. We then address the nonfunctional internal/external biliary drain. Initial images of the abdomen demonstrates that the drain has been withdrawn compared to the position on the prior placement Contrast injected through the drain demonstrates some resistance. The catheter was ligated, 1% lidocaine  was administered at the skin, and the retention suture was cut. A Bentson wire was passed, which met some resistance in the drain, likely debris. Wire was passed to the duodenum. Catheter was then withdrawn on the wire and a new 12 French 45 cm internal/external biliary drain was placed via the left approach. Contrast was injected confirming the location. Drain was sutured in position and attached to gravity drainage Patient tolerated the procedure well and remained hemodynamically stable throughout. No complications were encountered and no significant blood loss.  IMPRESSION: Status post right upper extremity brachial vein PICC, double lumen, ready for use. Status post exchange of partially occluded internal/external biliary drain with a new 12 French drain placed to gravity. Signed, Dulcy Fanny. Dellia Nims, RPVI [facility] Radiology Electronically Signed   By: Corrie Mckusick D.O.   On: 07/27/2020 14:59   IR EXCHANGE BILIARY DRAIN  Result Date: 07/27/2020 INDICATION: 80 year old female with pancreatic carcinoma, prior internal external biliary drain now with suspected occlusion. She presents for IV access for possible chemotherapy and for exchange of the nonfunctional biliary drain. EXAM: IMAGE GUIDED RIGHT UPPER EXTREMITY PICC IMAGE GUIDED EXCHANGE INTERNAL EXTERNAL BILIARY DRAIN MEDICATIONS: None ANESTHESIA/SEDATION: Moderate (conscious) sedation was employed during this procedure. A total of Versed 2.0 mg and Fentanyl 100 mcg was administered intravenously. Moderate Sedation Time: 20 minutes. The patient's level of consciousness and vital signs were monitored continuously by radiology nursing throughout the procedure under my direct supervision. FLUOROSCOPY TIME:  Fluoroscopy Time: 1 minutes 0 seconds (14.7 mGy). COMPLICATIONS: None PROCEDURE: Informed written consent was obtained from the patient after a thorough discussion of the procedural risks, benefits and alternatives. All questions were addressed. Maximal Sterile Barrier Technique was utilized including caps, mask, sterile gowns, sterile gloves, sterile drape, hand hygiene and skin antiseptic. A timeout was performed prior to the initiation of the procedure. Patient was position in the supine position on the fluoroscopy table with the right arm abducted 90 degrees. Ultrasound survey of the upper extremity was performed with images stored and sent to PACs. The right brachial vein was selected for access. Once the patient was prepped and draped in the usual sterile  fashion, the skin and subcutaneous tissues were generously infiltrated with 1% lidocaine for local anesthesia. A micropuncture access kit was then used to access the targeted vein. Wire was passed centrally, confirmed to be within the venous system under fluoroscopy. A small stab incision was made with an 11 blade scalpel and the sheath was then placed over the wire. Estimated length of the catheter was then performed with the indwelling wire. Catheter was amputated at 39 cm length and placed with coaxial wire through the peel-away. Double lumen, power injectable PICC in the brachial vein. Tip confirmed at the cavoatrial junction, and the catheter is ready for use. Stat lock was placed. We then address the nonfunctional internal/external biliary drain. Initial images of the abdomen demonstrates that the drain has been withdrawn compared to the position on the prior placement Contrast injected through the drain demonstrates some resistance. The catheter was ligated, 1% lidocaine was administered at the skin, and the retention suture was cut. A Bentson wire was passed, which met some resistance in the drain, likely debris. Wire was passed to the duodenum. Catheter was then withdrawn on the wire and a new 12 French 45 cm internal/external biliary drain was placed via the left approach. Contrast was injected confirming the location. Drain was sutured in position and attached to gravity drainage  Patient tolerated the procedure well and remained hemodynamically stable throughout. No complications were encountered and no significant blood loss. IMPRESSION: Status post right upper extremity brachial vein PICC, double lumen, ready for use. Status post exchange of partially occluded internal/external biliary drain with a new 12 French drain placed to gravity. Signed, Dulcy Fanny. Dellia Nims, RPVI [facility] Radiology Electronically Signed   By: Corrie Mckusick D.O.   On: 07/27/2020  14:59   Labs on Admission: I have personally reviewed following labs  CBC: Recent Labs  Lab 07/22/20 0815 07/28/20 0850  WBC 9.2 11.3*  NEUTROABS 5.8 8.0*  HGB 12.9 13.3  HCT 39.7 41.4  MCV 84.3 84.3  PLT 280 903   Basic Metabolic Panel: Recent Labs  Lab 07/22/20 0815 07/28/20 0850 07/28/20 1000  NA 139 144  --   K 4.0 3.9  --   CL 100 105  --   CO2 21* 19*  --   GLUCOSE 132* 188*  --   BUN 21 41*  --   CREATININE 1.32* 2.13*  --   CALCIUM 10.0 10.2  --   MG  --   --  2.3   GFR: Estimated Creatinine Clearance: 21.7 mL/min (A) (by C-G formula based on SCr of 2.13 mg/dL (H)).  Liver Function Tests: Recent Labs  Lab 07/22/20 0815 07/28/20 0850 07/28/20 1000  AST 251* 251* 239*  ALT 123* 148* 145*  ALKPHOS 137* 119 121  BILITOT 6.5* 6.7* 6.8*  PROT 8.5* 8.6* 8.6*  ALBUMIN 3.5 3.6 3.7   Recent Labs  Lab 07/28/20 1000  LIPASE 76*   Recent Labs  Lab 07/28/20 1000  AMMONIA 18   Coagulation Profile: Recent Labs  Lab 07/28/20 1000  INR 1.2   CBG: Recent Labs  Lab 07/27/20 1334 07/28/20 0927  GLUCAP 127* 185*   Urine analysis:    Component Value Date/Time   COLORURINE YELLOW (A) 05/23/2020 0406   APPEARANCEUR HAZY (A) 05/23/2020 0406   APPEARANCEUR Hazy 08/31/2013 1738   LABSPEC >1.046 (H) 05/23/2020 0406   LABSPEC 1.008 08/31/2013 1738   PHURINE 5.0 05/23/2020 0406   GLUCOSEU NEGATIVE 05/23/2020 0406   GLUCOSEU Negative 08/31/2013 1738   HGBUR NEGATIVE 05/23/2020 0406   BILIRUBINUR NEGATIVE 05/23/2020 0406   BILIRUBINUR small 10/06/2019 1034   BILIRUBINUR Negative 08/31/2013 1738   KETONESUR NEGATIVE 05/23/2020 0406   PROTEINUR NEGATIVE 05/23/2020 0406   UROBILINOGEN 0.2 10/06/2019 1034   NITRITE NEGATIVE 05/23/2020 0406   LEUKOCYTESUR NEGATIVE 05/23/2020 0406   LEUKOCYTESUR Negative 08/31/2013 1738   Sherwin Hollingshed N Freddy Kinne D.O. [facility]  If 7PM-7AM, please contact overnight-coverage provider If 7AM-7PM, please contact day coverage  provider [email or web address]  07/29/2020, 1:49 AM

## 2020-07-28 NOTE — ED Notes (Signed)
Pt received meal tray. 

## 2020-07-28 NOTE — Assessment & Plan Note (Addendum)
#  Pancreatic adenocarcinoma locally advanced/invading duodenum; 1 cm peripancreatic lymphadenopathy; CT scan at [facility] 2022]-hepatic artery abutment; T4N1-stage III disease.  #Hold chemotherapy because of acute issues [lethargy/hypotension].   #Lethargy/nausea vomiting/poor p.o. intake-question duodenal obstruction from the tumor/worsening liver function/hepatic encephalopathy-recommend further imaging to assess possible luminal obstruction.    If patient has duodenal obstruction on imaging-consider duodenal stenting; previously discussed with Dr. Manuella Ghazi at [facility].  #Hypotension systolic 49I/YMEBRAXE-  patient had a high risk of sepsis/cholangitis.  Needs urgent evaluation in the emergency room.  IV fluid resuscitation.  # Obstructive jaundice/ elevated LFTs-bilirubin still elevated at 6; worsening AST ALT alkaline phosphatase.  Discussed with Dr. Damita Dunnings on 5/4-patient had Exchange of partially occluded int/ext biliary drain.   # Palliative care evaluation: Awaiting palliative care evaluation given overall poor prognosis  I spoke at length with the patient's daughter regarding the patient's clinical status/plan of care.  Daughter in agreement.    # DISPOSITION # ER evaluation- #Follow-up to be decided- Dr.B

## 2020-07-28 NOTE — ED Notes (Signed)
Pt back from CT

## 2020-07-28 NOTE — Progress Notes (Signed)
Having weakness, nausea and vomiting. Does not have appetite at all. Looks a little lethargic today.

## 2020-07-28 NOTE — ED Notes (Signed)
Patient transported to CT 

## 2020-07-28 NOTE — ED Notes (Signed)
Critical result   Lactic Acid 3.0  Mr. Tracy Bolton

## 2020-07-28 NOTE — ED Notes (Signed)
Registration at bedside.

## 2020-07-28 NOTE — ED Triage Notes (Signed)
Pt to ER from cancer center with daughter, where she was scheduled for labs and an infusion. Pt has pancreatic cancer. Pt has had decreased oral intake x2 days, increasing weakness. At cancer center today her BP was systolic 52'C, pt unable to hold up head in triage. Lethargic and confused.

## 2020-07-28 NOTE — Progress Notes (Signed)
[place] NOTE  Patient Care Team: Delsa Grana, PA-C as PCP - General (Family Medicine) Loney Loh, Michaell Cowing, MD as Referring Physician (Endocrinology) Olean Ree, MD as Consulting Physician (General Surgery) Clent Jacks, RN as Oncology Nurse Navigator  CHIEF COMPLAINTS/PURPOSE OF CONSULTATION: Pancreatic cancer    Oncology History Overview Note  # FEB 28th 2022-  Ill-defined hypodense area seen within the pancreatic head which appears to abut the duodenum and adjacent to coarse calcifications. 2. Duodenal diverticula with focal wall thickening/narrowing which; MRI Abdomen- 3.5 cm ill-defined hypovascular mass in the pancreatic head, highly suspicious for pancreatic adenocarcinoma. This causes mild diffuse biliary and pancreatic ductal dilatation. 1 cm peripancreatic lymph node, suspicious for metastatic disease.  # EUS/ERCP-attempted at [facility] 8th]; Dr.Spaete-unable to traverse duodenal stricture; duodenal biopsy positive adenocarcinoma.   could be due to mild duodenitis. 5. 8 mm pulmonary nodule within the right lung base. N # Goiter [Dr.Clemmons; [facility]: now with [facility]]   Impression:  1. Pancreatic head mass with invasion of the adjacent duodenum. Mild  stranding inflammation may be postprocedural or reflect mild superimposed  pancreatitis.  2. Vascular involvement as above.  3. Prominent porta hepatis and peripancreatic lymph nodes, concerning for  nodal involvement. No other evidence of metastatic disease.  4. Endometrium abnormally thickened for age. Correlate with abnormal  bleeding and consider GYN workup as indicated.    #April 2022-pT4pN1- stage III pancreatic adenocarcinoma [s/p Eval at [facility], [place]   Primary pancreatic cancer ([place])  07/05/2020 Initial Diagnosis   Primary pancreatic cancer ([place])   07/22/2020 -  Chemotherapy    Patient is on Treatment Plan: PANCREATIC ABRAXANE / GEMCITABINE D1,8,15 Q28D         HISTORY  OF PRESENTING ILLNESS:  Tracy Bolton 80 y.o.  female with newly diagnosed pancreatic cancer/locally advanced; obstructive jaundice is here for a follow-up today with results/subsequent plan of care/proceed with chemotherapy  In the interim-patient was evaluated by IR-had a PICC line placed for IV access/chemotherapy.  Mediport cannot be placed because of goiter.  After discussion with Dr. Susa Loffler exchanged partially occluded biliary stent.  As per family patient has been complaining of nausea; vomiting.  Poor p.o. intake.  Denies any pain.   Patient has been drowsy.  Feels weak all over.  Difficulty with walking.  Review of Systems  Constitutional: Positive for weight loss. Negative for chills, diaphoresis, fever and malaise/fatigue.  HENT: Negative for nosebleeds and sore throat.   Eyes: Negative for double vision.  Respiratory: Negative for cough, hemoptysis, sputum production, shortness of breath and wheezing.   Cardiovascular: Negative for chest pain, palpitations, orthopnea and leg swelling.  Gastrointestinal: Negative for blood in stool, constipation, diarrhea, heartburn, melena, nausea and vomiting.  Genitourinary: Negative for dysuria, frequency and urgency.  Musculoskeletal: Positive for back pain and joint pain.  Skin: Negative.  Negative for itching and rash.  Neurological: Negative for dizziness, tingling, focal weakness, weakness and headaches.  Endo/Heme/Allergies: Does not bruise/bleed easily.  Psychiatric/Behavioral: Negative for depression. The patient is not nervous/anxious and does not have insomnia.      MEDICAL HISTORY:  Past Medical History:  Diagnosis Date  . Arthritis   . Gout, chronic   . Hyperlipidemia   . Hypertension   . Osteoporosis   . Thyroid disease     SURGICAL HISTORY: Past Surgical History:  Procedure Laterality Date  . CATARACT EXTRACTION, BILATERAL  02/2018  . COLONOSCOPY  2016  . IR BILIARY DRAIN PLACEMENT WITH CHOLANGIOGRAM  07/08/2020  . IR EXCHANGE BILIARY DRAIN  07/27/2020  . IR FLUORO GUIDE CV LINE LEFT  07/27/2020  . LUNG SURGERY Right 2004   biopsy normal    SOCIAL HISTORY: Social History   Socioeconomic History  . Marital status: Single    Spouse name: Not on file  . Number of children: 2  . Years of education: Not on file  . Highest education level: Some college, no degree  Occupational History  . Occupation: retired  Tobacco Use  . Smoking status: Never Smoker  . Smokeless tobacco: Never Used  Vaping Use  . Vaping Use: Never used  Substance and Sexual Activity  . Alcohol use: No    Alcohol/week: 0.0 standard drinks  . Drug use: No  . Sexual activity: Not Currently  Other Topics Concern  . Not on file  Social History Narrative   Pt lives alone. With [place]- med alert [closely -daughter, salibury]. Never smoked; no alcohol. Worked for [employer]; retd [6606]    Social Determinants of Radio broadcast assistant Strain: Low Risk   . Difficulty of Paying Living Expenses: Not hard at all  Food Insecurity: No Food Insecurity  . Worried About Charity fundraiser in the Last Year: Never true  . Ran Out of Food in the Last Year: Never true  Transportation Needs: No Transportation Needs  . Lack of Transportation (Medical): No  . Lack of Transportation (Non-Medical): No  Physical Activity: Insufficiently Active  . Days of Exercise per Week: 7 days  . Minutes of Exercise per Session: 20 min  Stress: No Stress Concern Present  . Feeling of Stress : Not at all  Social Connections: Moderately Integrated  . Frequency of Communication with Friends and Family: More than three times a week  . Frequency of Social Gatherings with Friends and Family: More than three times a week  . Attends Religious Services: More than 4 times per year  . Active Member of Clubs or Organizations: Yes  . Attends Archivist Meetings: More than 4 times per year  . Marital Status: Never married  Intimate  Partner Violence: Not At Risk  . Fear of Current or Ex-Partner: No  . Emotionally Abused: No  . Physically Abused: No  . Sexually Abused: No    FAMILY HISTORY: Family History  Problem Relation Age of Onset  . Bone cancer Father 89       not biological father  . Thyroid disease Sister   . Cancer Mother 60       brain tumor  . Breast cancer Neg Hx     ALLERGIES:  has No Known Allergies.  MEDICATIONS:  Current Outpatient Medications  Medication Sig Dispense Refill  . allopurinol (ZYLOPRIM) 100 MG tablet TAKE 1 TABLET BY MOUTH EVERY DAY 90 tablet 3  . atorvastatin (LIPITOR) 10 MG tablet TAKE 1 TABLET BY MOUTH EVERYDAY AT BEDTIME 90 tablet 3  . blood glucose meter kit and supplies KIT Dispense based on patient and insurance preference. Use up to four times daily as directed. 1 each 0  . lidocaine-prilocaine (EMLA) cream Apply 30 -45 mins prior to port access. 30 g 0  . nystatin (MYCOSTATIN/NYSTOP) powder APPLY TO AFFECTED AREA OF SKIN 3X A DAY AS NEEDED IN A SUFFICIENT AMOUNT 30 g 2  . ondansetron (ZOFRAN) 8 MG tablet Take 1 tablet (8 mg total) by mouth every 8 (eight) hours as needed for nausea or vomiting. 45 tablet 1  . oxyCODONE (OXY IR/ROXICODONE)  5 MG immediate release tablet Take 1 tablet (5 mg total) by mouth every 6 (six) hours as needed for severe pain or breakthrough pain. 30 tablet 0  . pantoprazole (PROTONIX) 40 MG tablet Take 1 tablet (40 mg total) by mouth daily. 30 tablet 3  . prochlorperazine (COMPAZINE) 10 MG tablet Take 1 tablet (10 mg total) by mouth every 6 (six) hours as needed for nausea or vomiting. 40 tablet 1  . amLODipine (NORVASC) 10 MG tablet TAKE 1 TABLET BY MOUTH EVERY DAY (Patient not taking: No sig reported) 90 tablet 3  . aspirin 81 MG tablet Take 81 mg by mouth. (Patient not taking: No sig reported)    . atenolol (TENORMIN) 50 MG tablet TAKE 1 TABLET BY MOUTH EVERY DAY (Patient not taking: No sig reported) 90 tablet 3  . metFORMIN (GLUCOPHAGE-XR) 500  MG 24 hr tablet TAKE 2 TABLETS BY MOUTH EVERY DAY (Patient not taking: No sig reported) 180 tablet 3  . senna (SENOKOT) 8.6 MG TABS tablet Take 1 tablet (8.6 mg total) by mouth at bedtime as needed for mild constipation. (Patient not taking: No sig reported) 120 tablet 0   No current facility-administered medications for this visit.   Facility-Administered Medications Ordered in Other Visits  Medication Dose Route Frequency Provider Last Rate Last Admin  . 0.9 %  sodium chloride infusion   Intravenous Continuous Covington, Jamie R, NP          .  PHYSICAL EXAMINATION: ECOG PERFORMANCE STATUS: 0 - Asymptomatic  Vitals:   07/28/20 0858  BP: (!) 83/33  Pulse: 84  Resp: 16  Temp: (!) 96.9 F (36.1 C)  SpO2: 99%   There were no vitals filed for this visit.  Physical Exam Constitutional:      Comments: Patient is accompanied by daughter.  Patient in wheelchair.  HENT:     Head: Normocephalic and atraumatic.     Mouth/Throat:     Pharynx: No oropharyngeal exudate.  Eyes:     General: Scleral icterus present.     Pupils: Pupils are equal, round, and reactive to light.  Neck:     Comments: Right-sided neck mass chronic Cardiovascular:     Rate and Rhythm: Normal rate and regular rhythm.  Pulmonary:     Effort: Pulmonary effort is normal. No respiratory distress.     Breath sounds: Normal breath sounds. No wheezing.  Abdominal:     General: Bowel sounds are normal. There is no distension.     Palpations: Abdomen is soft. There is no mass.     Tenderness: There is no abdominal tenderness. There is no guarding or rebound.  Musculoskeletal:        General: No tenderness. Normal range of motion.     Cervical back: Normal range of motion and neck supple.  Skin:    General: Skin is warm.  Neurological:     Mental Status: She is alert and oriented to person, place, and time.     Comments: Patient appears drowsy.  Psychiatric:        Mood and Affect: Affect normal.     LABORATORY DATA:  I have reviewed the data as listed Lab Results  Component Value Date   WBC 11.3 (H) 07/28/2020   HGB 13.3 07/28/2020   HCT 41.4 07/28/2020   MCV 84.3 07/28/2020   PLT 260 07/28/2020   Recent Labs    10/06/19 1056 04/08/20 1139 05/22/20 2212 07/09/20 0405 07/15/20 1037 07/22/20 0815  NA 140 140   < >  138 135 139  K 4.6 4.3   < > 3.6 4.0 4.0  CL 102 103   < > 101 97* 100  CO2 26 24   < > 25 20* 21*  GLUCOSE 150* 156*   < > 103* 141* 132*  BUN 20 22   < > 10 30* 21  CREATININE 1.17* 0.96*   < > 0.85 1.61* 1.32*  CALCIUM 10.0 10.0   < > 9.0 9.4 10.0  GFRNONAA 45* 57*   < > >60 33* 42*  GFRAA 52* 66  --   --   --   --   PROT 7.2 7.1   < > 7.1 8.2* 8.5*  ALBUMIN  --   --    < > 3.2* 3.5 3.5  AST 21 17   < > 227* 236* 251*  ALT 15 9   < > 119* 109* 123*  ALKPHOS  --   --    < > 196* 151* 137*  BILITOT 0.9 1.0   < > 4.9* 6.2* 6.5*  BILIDIR  --   --   --   --   --  3.1*  IBILI  --   --   --   --   --  3.4*   < > = values in this interval not displayed.    RADIOGRAPHIC STUDIES: I have personally reviewed the radiological images as listed and agreed with the findings in the report. [place] Intraoperative  Result Date: 07/08/2020 INDICATION: Jaundice, biliary obstruction, pancreas cancer, successful ERCP EXAM: ULTRASOUND FLUOROSCOPIC LEFT INTERNAL EXTERNAL 10 FRENCH BILIARY DRAIN MEDICATIONS: 2 g Mefoxin; The antibiotic was administered within an appropriate time frame prior to the initiation of the procedure. ANESTHESIA/SEDATION: Moderate (conscious) sedation was employed during this procedure. A total of Versed 2.0 mg and Fentanyl 150 mcg was administered intravenously. Moderate Sedation Time: 32 minutes. The patient's level of consciousness and vital signs were monitored continuously by radiology nursing throughout the procedure under my direct supervision. FLUOROSCOPY TIME:  Fluoroscopy Time: 5 minutes 12 seconds (89 mGy). COMPLICATIONS: None immediate.  PROCEDURE: Informed written consent was obtained from the patient after a thorough discussion of the procedural risks, benefits and alternatives. All questions were addressed. Maximal Sterile Barrier Technique was utilized including caps, mask, sterile gowns, sterile gloves, sterile drape, hand hygiene and skin antiseptic. A timeout was performed prior to the initiation of the procedure. Previous imaging reviewed. Preliminary ultrasound performed of the liver. Left hepatic ducts were localized from a subxiphoid approach. Overlying skin marked. Under sterile conditions and local anesthesia, a 21 gauge 15 cm access was advanced percutaneously under ultrasound into a peripheral left hepatic duct. Needle position confirmed with ultrasound and return of clear bile. Ultrasound images obtained for documentation. Contrast injection performed for initial cholangiogram. This demonstrates diffuse biliary dilatation and distal obstruction. 018 guidewire advanced into the biliary tree followed by the transitional dilator set. Amplatz guidewire advanced followed by a Kumpe catheter. Kumpe catheter advanced into the duodenum. Contrast injection confirms position in the duodenum. Over an Amplatz guidewire, tract dilatation performed to insert a 10 [place] internal external biliary drain. Retention loop positioned in the duodenum. Contrast injection confirms position. Images obtained for documentation. Cholangiogram confirms biliary obstruction. Cystic duct is patent with numerous gallstones noted. IMPRESSION: Successful 10 French left internal external biliary drain for distal CBD obstruction. Electronically Signed   By: Jerilynn Mages.  Shick M.D.   On: 07/08/2020 12:19   NM PET Image Initial (PI) Skull Base To Thigh  Addendum Date: 07/14/2020   ADDENDUM REPORT: 07/14/2020 16:13 ADDENDUM: Note that there is some mild to moderate airway narrowing at the thoracic inlet. The patient has a chronic mass in this area. Would still however  correlate with any respiratory symptoms given the marked displacement of the trachea. For reference the trachea in the upper chest is 15 mm and at the thoracic inlet is 9 mm greatest width. Note also that nodular areas in the chest are without increased metabolic activity. These results will be called to the ordering clinician or representative by the Radiologist Assistant, and communication documented in the PACS or [employer]. Electronically Signed   By: Zetta Bills M.D.   On: 07/14/2020 16:13   Result Date: 07/14/2020 CLINICAL DATA:  Initial treatment strategy for pancreatic cancer. EXAM: NUCLEAR MEDICINE PET SKULL BASE TO THIGH TECHNIQUE: 10.2 mCi F-18 FDG was injected intravenously. Full-ring PET imaging was performed from the skull base to thigh after the radiotracer. CT data was obtained and used for attenuation correction and anatomic localization. Fasting blood glucose: CT of the abdomen and pelvis from May 23, 2020 mg/dl COMPARISON:  Abdominal CT from May 23, 2020 FINDINGS: Mediastinal blood pool activity: SUV max 3.7 Liver activity: SUV max NA NECK: Large RIGHT neck mass 9.6 x 7.8 cm with areas of increased FDG uptake with maximum SUV of 5.9. There is tracheal deviation that is been described on multiple prior chest x-rays dating back to 2006. No discrete adenopathy is present at the thoracic inlet or in the neck. Normal anatomy is highly distorted and the airway is narrowed secondary to mass effect from this thoracic inlet/neck mass that extends from the submandibular region into the upper chest. Incidental CT findings: none CHEST: No hypermetabolic mediastinal or hilar nodes. Scattered pulmonary nodules, described below show no increased metabolic activity above blood pool largest in the RIGHT chest with a maximum SUV of 1.2 Incidental CT findings: Calcified atheromatous plaque of the thoracic aorta. Nonaneurysmal. Mildly engorged central pulmonary vasculature 3.2 cm. Mitral annular  calcifications. Normal heart size. No pericardial effusion. No adenopathy by size criteria in the chest low-density area measuring water density in the mediastinum may represent a small pericardial cyst seen on image 62 and 63 of series 3 and shows no increased FDG uptake. Basilar atelectasis. Areas of nodularity are present, 1 area along the major fissure in the RIGHT chest measures 10 x 7 mm. Apical pleural scarring. Ill-defined nodule in the LEFT upper lobe measuring 4 mm. Another in the LEFT upper lobe measuring 4 mm on images 53 and 57 respectively. Pulmonary emphysema. ABDOMEN/PELVIS: Large hypermetabolic mass in the pancreatic-duodenal groove with a maximum SUV of 30 corresponding to the abnormality seen on prior images, difficult to separate from duodenum on the current exam not grossly changed compared to the recent MRI evaluation Heterogeneous hepatic activity without discrete lesion. Increased metabolic uptake along the patient's biliary drainage catheter passing through the LEFT hepatic lobe. Lymph node adjacent to the pancreatic head measuring approximately 12 mm with increased metabolic activity (image 951, series 3. Metabolic activity is difficult to separate from the adjacent pancreatic mass maximum SUV estimated to be in the range of 6.5. Hepatic gastric/celiac nodes, a cluster of lymph nodes seen on image 114 of series 3 also with some dilated vascular channels in this area metabolic activity at approximately 3.5 maximum SUV, largest node in this area approximately 6 mm. No retroperitoneal adenopathy below the renal veins. Diffuse bowel activity Incidental CT findings: Cholelithiasis. Numerous gallstones in  the gallbladder. Spleen, adrenal glands and kidneys without acute process. No perinephric stranding. Urinary bladder collapsed. No acute gastrointestinal process. Normal appendix. Uterus and adnexa unremarkable by CT. Small fat containing umbilical hernia. SKELETON: No focal hypermetabolic  activity to suggest skeletal metastasis. 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 Spinal degenerative changes. IMPRESSION: Pancreatic mass with local adenopathy now with biliary drain in place. Signs of peripancreatic nodal enlargement with smaller nodes in the gastrohepatic recess. No definitive evidence of distant metastatic disease. Areas of ill-defined nodularity in the chest most notably in the RIGHT mid chest along the minor fissure, of uncertain significance. The dominant area shows tubular morphology. Close attention on follow-up is suggested, consider short interval follow-up for further evaluation. Large mediastinal/lower lobe neck mass may represent a large border and is been commented on on multiple prior chest x-rays. No comparison cross-sectional imaging is available. This area shows increased FDG uptake. Focused ultrasound and biopsy is suggested. Recommend thyroid US and biopsy yes sarcasm just cleanup this 1 finger and there is(ref: J Am Coll Radiol. 2015 Feb;12(2): 143-50). Electronically Signed: By: Zetta Bills M.D. On: 07/14/2020 15:53   IR Fluoro Guide CV Line Left  Result Date: 07/27/2020 INDICATION: 80 year old female with pancreatic carcinoma, prior internal external biliary drain now with suspected occlusion. She presents for IV access for possible chemotherapy and for exchange of the nonfunctional biliary drain. EXAM: IMAGE GUIDED RIGHT UPPER EXTREMITY PICC IMAGE GUIDED EXCHANGE INTERNAL EXTERNAL BILIARY DRAIN MEDICATIONS: None ANESTHESIA/SEDATION: Moderate (conscious) sedation was employed during this procedure. A total of Versed 2.0 mg and Fentanyl 100 mcg was administered intravenously. Moderate Sedation Time: 20 minutes. The patient's level of consciousness and vital signs were monitored continuously by radiology nursing throughout the procedure under my direct supervision. FLUOROSCOPY TIME:  Fluoroscopy Time: 1 minutes 0 seconds (14.7 mGy).  COMPLICATIONS: None PROCEDURE: Informed written consent was obtained from the patient after a thorough discussion of the procedural risks, benefits and alternatives. All questions were addressed. Maximal Sterile Barrier Technique was utilized including caps, mask, sterile gowns, sterile gloves, sterile drape, hand hygiene and skin antiseptic. A timeout was performed prior to the initiation of the procedure. Patient was position in the supine position on the fluoroscopy table with the right arm abducted 90 degrees. Ultrasound survey of the upper extremity was performed with images stored and sent to PACs. The right brachial vein was selected for access. Once the patient was prepped and draped in the usual sterile fashion, the skin and subcutaneous tissues were generously infiltrated with 1% lidocaine for local anesthesia. A micropuncture access kit was then used to access the targeted vein. Wire was passed centrally, confirmed to be within the venous system under fluoroscopy. A small stab incision was made with an 11 blade scalpel and the sheath was then placed over the wire. Estimated length of the catheter was then performed with the indwelling wire. Catheter was amputated at 39 cm length and placed with coaxial wire through the peel-away. Double lumen, power injectable PICC in the brachial vein. Tip confirmed at the cavoatrial junction, and the catheter is ready for use. Stat lock was placed. We then address the nonfunctional internal/external biliary drain. Initial images of the abdomen demonstrates that the drain has been withdrawn compared to the position on the prior placement Contrast injected through the drain demonstrates some resistance. The catheter was ligated, 1% lidocaine was administered at the skin, and the retention suture was cut. A Bentson wire was passed, which met some resistance  in the drain, likely debris. Wire was passed to the duodenum. Catheter was then withdrawn on the wire and a new 12  French 45 cm internal/external biliary drain was placed via the left approach. Contrast was injected confirming the location. Drain was sutured in position and attached to gravity drainage Patient tolerated the procedure well and remained hemodynamically stable throughout. No complications were encountered and no significant blood loss. IMPRESSION: Status post right upper extremity brachial vein PICC, double lumen, ready for use. Status post exchange of partially occluded internal/external biliary drain with a new 12 French drain placed to gravity. Signed, Dulcy Fanny. Dellia Nims, RPVI [facility] Electronically Signed   By: Corrie Mckusick D.O.   On: 07/27/2020 14:59   IR BILIARY DRAIN PLACEMENT WITH CHOLANGIOGRAM  Result Date: 07/08/2020 INDICATION: Jaundice, biliary obstruction, pancreas cancer, successful ERCP EXAM: ULTRASOUND FLUOROSCOPIC LEFT INTERNAL EXTERNAL 10 FRENCH BILIARY DRAIN MEDICATIONS: 2 g Mefoxin; The antibiotic was administered within an appropriate time frame prior to the initiation of the procedure. ANESTHESIA/SEDATION: Moderate (conscious) sedation was employed during this procedure. A total of Versed 2.0 mg and Fentanyl 150 mcg was administered intravenously. Moderate Sedation Time: 32 minutes. The patient's level of consciousness and vital signs were monitored continuously by radiology nursing throughout the procedure under my direct supervision. FLUOROSCOPY TIME:  Fluoroscopy Time: 5 minutes 12 seconds (89 mGy). COMPLICATIONS: None immediate. PROCEDURE: Informed written consent was obtained from the patient after a thorough discussion of the procedural risks, benefits and alternatives. All questions were addressed. Maximal Sterile Barrier Technique was utilized including caps, mask, sterile gowns, sterile gloves, sterile drape, hand hygiene and skin antiseptic. A timeout was performed prior to the initiation of the procedure. Previous  imaging reviewed. Preliminary ultrasound performed of the liver. Left hepatic ducts were localized from a subxiphoid approach. Overlying skin marked. Under sterile conditions and local anesthesia, a 21 gauge 15 cm access was advanced percutaneously under ultrasound into a peripheral left hepatic duct. Needle position confirmed with ultrasound and return of clear bile. Ultrasound images obtained for documentation. Contrast injection performed for initial cholangiogram. This demonstrates diffuse biliary dilatation and distal obstruction. 018 guidewire advanced into the biliary tree followed by the transitional dilator set. Amplatz guidewire advanced followed by a Kumpe catheter. Kumpe catheter advanced into the duodenum. Contrast injection confirms position in the duodenum. Over an Amplatz guidewire, tract dilatation performed to insert a 10 [place] internal external biliary drain. Retention loop positioned in the duodenum. Contrast injection confirms position. Images obtained for documentation. Cholangiogram confirms biliary obstruction. Cystic duct is patent with numerous gallstones noted. IMPRESSION: Successful 10 French left internal external biliary drain for distal CBD obstruction. Electronically Signed   By: Jerilynn Mages.  Shick M.D.   On: 07/08/2020 12:19   IR EXCHANGE BILIARY DRAIN  Result Date: 07/27/2020 INDICATION: 80 year old female with pancreatic carcinoma, prior internal external biliary drain now with suspected occlusion. She presents for IV access for possible chemotherapy and for exchange of the nonfunctional biliary drain. EXAM: IMAGE GUIDED RIGHT UPPER EXTREMITY PICC IMAGE GUIDED EXCHANGE INTERNAL EXTERNAL BILIARY DRAIN MEDICATIONS: None ANESTHESIA/SEDATION: Moderate (conscious) sedation was employed during this procedure. A total of Versed 2.0 mg and Fentanyl 100 mcg was administered intravenously. Moderate Sedation Time: 20 minutes. The patient's level of consciousness and vital signs were monitored  continuously by radiology nursing throughout the procedure under my direct supervision. FLUOROSCOPY TIME:  Fluoroscopy Time: 1 minutes 0 seconds (14.7 mGy). COMPLICATIONS: None PROCEDURE: Informed written consent was obtained from the  patient after a thorough discussion of the procedural risks, benefits and alternatives. All questions were addressed. Maximal Sterile Barrier Technique was utilized including caps, mask, sterile gowns, sterile gloves, sterile drape, hand hygiene and skin antiseptic. A timeout was performed prior to the initiation of the procedure. Patient was position in the supine position on the fluoroscopy table with the right arm abducted 90 degrees. Ultrasound survey of the upper extremity was performed with images stored and sent to PACs. The right brachial vein was selected for access. Once the patient was prepped and draped in the usual sterile fashion, the skin and subcutaneous tissues were generously infiltrated with 1% lidocaine for local anesthesia. A micropuncture access kit was then used to access the targeted vein. Wire was passed centrally, confirmed to be within the venous system under fluoroscopy. A small stab incision was made with an 11 blade scalpel and the sheath was then placed over the wire. Estimated length of the catheter was then performed with the indwelling wire. Catheter was amputated at 39 cm length and placed with coaxial wire through the peel-away. Double lumen, power injectable PICC in the brachial vein. Tip confirmed at the cavoatrial junction, and the catheter is ready for use. Stat lock was placed. We then address the nonfunctional internal/external biliary drain. Initial images of the abdomen demonstrates that the drain has been withdrawn compared to the position on the prior placement Contrast injected through the drain demonstrates some resistance. The catheter was ligated, 1% lidocaine was administered at the skin, and the retention suture was cut. A Bentson  wire was passed, which met some resistance in the drain, likely debris. Wire was passed to the duodenum. Catheter was then withdrawn on the wire and a new 12 French 45 cm internal/external biliary drain was placed via the left approach. Contrast was injected confirming the location. Drain was sutured in position and attached to gravity drainage Patient tolerated the procedure well and remained hemodynamically stable throughout. No complications were encountered and no significant blood loss. IMPRESSION: Status post right upper extremity brachial vein PICC, double lumen, ready for use. Status post exchange of partially occluded internal/external biliary drain with a new 12 French drain placed to gravity. Signed, Dulcy Fanny. Dellia Nims, RPVI [facility] Radiology Electronically Signed   By: Corrie Mckusick D.O.   On: 07/27/2020 14:59    ASSESSMENT & PLAN:   Primary pancreatic cancer [facility]) #Pancreatic adenocarcinoma locally advanced/invading duodenum; 1 cm peripancreatic lymphadenopathy; CT scan at [facility] 2022]-hepatic artery abutment; T4N1-stage III disease.  #Hold chemotherapy because of acute issues [lethargy/hypotension].   #Lethargy/nausea vomiting/poor p.o. intake-question duodenal obstruction from the tumor/worsening liver function/hepatic encephalopathy-recommend further imaging to assess possible luminal obstruction.    If patient has duodenal obstruction on imaging-consider duodenal stenting; previously discussed with Dr. Manuella Ghazi at [facility].  #Hypotension systolic 32T/FTDDUKGU-  patient had a high risk of sepsis/cholangitis.  Needs urgent evaluation in the emergency room.  IV fluid resuscitation.  # Obstructive jaundice/ elevated LFTs-bilirubin still elevated at 6; worsening AST ALT alkaline phosphatase.  Discussed with Dr. Damita Dunnings on 5/4-patient had Exchange of partially occluded int/ext biliary drain.   # Palliative care evaluation: Awaiting  palliative care evaluation given overall poor prognosis  I spoke at length with the patient's daughter regarding the patient's clinical status/plan of care.  Daughter in agreement.    # DISPOSITION # ER evaluation- #Follow-up to be decided- Dr.B  All questions were answered. The patient knows to call the clinic with any  problems, questions or concerns.    Cammie Sickle, MD 07/28/2020 9:23 AM

## 2020-07-28 NOTE — ED Provider Notes (Signed)
[facility] Emergency Department Provider Note  ____________________________________________   Event Date/Time   First MD Initiated Contact with Patient 07/28/20 [phone]     (approximate)  I have reviewed the triage vital signs and the nursing notes.   HISTORY  Chief Complaint Hypotension    HPI Tracy Bolton is a 80 y.o. female with pancreatic cancer being followed by oncology Dr. Rogue Bussing who comes in for hypotension.  Patient is currently on chemotherapy.  Patient has a PICC line R arm.  Patient has been feeling drowsy, weak all over difficulty with walking and having some nausea and vomiting.  According to the daughter she has vomited once every day and is just not been eating.  Nothing makes it better, nothing makes it worse.  She denies any chest pain, shortness of breath, abdominal pain  On review of records patient was sent over for further imaging to assess possible luminal obstruction.          Past Medical History:  Diagnosis Date  . Arthritis   . Gout, chronic   . Hyperlipidemia   . Hypertension   . Osteoporosis   . Thyroid disease     Patient Active Problem List   Diagnosis Date Noted  . Biliary obstruction 07/07/2020  . Primary pancreatic cancer ([place]) 07/05/2020  . Osteopenia 07/02/2019  . Limited mobility 03/03/2019  . Abnormality of gait and mobility 03/03/2019  . Risk for falls 03/03/2019  . Urine test positive for microalbuminuria 06/06/2017  . Full code status 05/21/2017  . Preventative health care 05/21/2017  . Elevated serum glutamic pyruvic transaminase (SGPT) level 05/19/2016  . Callus of foot 05/18/2016  . Urine abnormality 11/14/2015  . Encounter for medication monitoring 11/14/2015  . Hyperlipidemia 11/14/2015  . Pre-diabetes 05/18/2015  . OP (osteoporosis) 09/06/2014  . Chronic gouty arthritis 09/06/2014  . Obesity, Class III, BMI 40-49.9 (morbid obesity) ([place]) 09/06/2014  . Idiopathic chronic gout without  tophus 07/12/2014  . Hypertension goal BP (blood pressure) < 140/90 07/12/2014  . Weakness of both lower extremities 07/12/2014  . Dry skin 07/12/2014  . Right-sided uninodular goiter 07/12/2014  . Cyst of eye 07/12/2014  . Constipation, chronic 07/12/2014  . Subclinical hypothyroidism 02/06/2013    Past Surgical History:  Procedure Laterality Date  . CATARACT EXTRACTION, BILATERAL  02/2018  . COLONOSCOPY  2016  . IR BILIARY DRAIN PLACEMENT WITH CHOLANGIOGRAM  07/08/2020  . IR EXCHANGE BILIARY DRAIN  07/27/2020  . IR FLUORO GUIDE CV LINE LEFT  07/27/2020  . LUNG SURGERY Right 2004   biopsy normal    Prior to Admission medications   Medication Sig Start Date End Date Taking? Authorizing Provider  allopurinol (ZYLOPRIM) 100 MG tablet TAKE 1 TABLET BY MOUTH EVERY DAY 09/29/19   Delsa Grana, PA-C  amLODipine (NORVASC) 10 MG tablet TAKE 1 TABLET BY MOUTH EVERY DAY Patient not taking: No sig reported 09/29/19   Delsa Grana, PA-C  aspirin 81 MG tablet Take 81 mg by mouth. Patient not taking: No sig reported 03/13/06   [provider]  atenolol (TENORMIN) 50 MG tablet TAKE 1 TABLET BY MOUTH EVERY DAY Patient not taking: No sig reported 09/29/19   Delsa Grana, PA-C  atorvastatin (LIPITOR) 10 MG tablet TAKE 1 TABLET BY MOUTH EVERYDAY AT BEDTIME 08/24/19   Delsa Grana, PA-C  blood glucose meter kit and supplies KIT Dispense based on patient and insurance preference. Use up to four times daily as directed. 07/15/20   Cammie Sickle, MD  lidocaine-prilocaine (EMLA) cream Apply 30 -45 mins prior to port access. 07/05/20   Cammie Sickle, MD  metFORMIN (GLUCOPHAGE-XR) 500 MG 24 hr tablet TAKE 2 TABLETS BY MOUTH EVERY DAY Patient not taking: No sig reported 09/29/19   Delsa Grana, PA-C  nystatin (MYCOSTATIN/NYSTOP) powder APPLY TO AFFECTED AREA OF SKIN 3X A DAY AS NEEDED IN A SUFFICIENT AMOUNT 01/27/20   Delsa Grana, PA-C  ondansetron (ZOFRAN) 8 MG tablet Take 1 tablet (8 mg total) by  mouth every 8 (eight) hours as needed for nausea or vomiting. 07/25/20   Borders, Kirt Boys, NP  oxyCODONE (OXY IR/ROXICODONE) 5 MG immediate release tablet Take 1 tablet (5 mg total) by mouth every 6 (six) hours as needed for severe pain or breakthrough pain. 07/09/20   Nita Sells, MD  pantoprazole (PROTONIX) 40 MG tablet Take 1 tablet (40 mg total) by mouth daily. 07/05/20   Cammie Sickle, MD  prochlorperazine (COMPAZINE) 10 MG tablet Take 1 tablet (10 mg total) by mouth every 6 (six) hours as needed for nausea or vomiting. 07/05/20   Cammie Sickle, MD  senna (SENOKOT) 8.6 MG TABS tablet Take 1 tablet (8.6 mg total) by mouth at bedtime as needed for mild constipation. Patient not taking: No sig reported 07/09/20   Nita Sells, MD    Allergies Patient has no known allergies.  Family History  Problem Relation Age of Onset  . Bone cancer Father 57       not biological father  . Thyroid disease Sister   . Cancer Mother 94       brain tumor  . Breast cancer Neg Hx     Social History Social History   Tobacco Use  . Smoking status: Never Smoker  . Smokeless tobacco: Never Used  Vaping Use  . Vaping Use: Never used  Substance Use Topics  . Alcohol use: No    Alcohol/week: 0.0 standard drinks  . Drug use: No      Review of Systems Constitutional: No fever/chills, weakness Eyes: No visual changes. ENT: No sore throat. Cardiovascular: Denies chest pain. Respiratory: Denies shortness of breath. Gastrointestinal: No abdominal pain.  Positive nausea and vomiting no constipation. Genitourinary: Negative for dysuria. Musculoskeletal: Negative for back pain. Skin: Negative for rash. Neurological: Negative for headaches, focal weakness or numbness. All other ROS negative ____________________________________________   PHYSICAL EXAM:  VITAL SIGNS: ED Triage Vitals  Enc Vitals Group     BP 07/28/20 0931 (!) 87/44     Pulse Rate 07/28/20 0931 88      Resp 07/28/20 0931 20     Temp --      Temp src --      SpO2 07/28/20 0931 95 %     Weight 07/28/20 0935 176 lb 5.9 oz (80 kg)     Height 07/28/20 0935 5' 2" (1.575 m)     Head Circumference --      Peak Flow --      Pain Score --      Pain Loc --      Pain Edu? --      Excl. in [place]? --     Constitutional: Alert and oriented.  Elderly appearing Eyes: Scleral icterus EOMI. Head: Atraumatic. Nose: No congestion/rhinnorhea. Mouth/Throat: Mucous membranes are dry Neck: No stridor. Trachea Midline. FROM Cardiovascular: Normal rate, regular rhythm. Grossly normal heart sounds.  Good peripheral circulation. Respiratory: Normal respiratory effort.  No retractions. Lungs CTAB. Gastrointestinal: Soft and nontender. No distention. No abdominal  bruits.  Drain noted Musculoskeletal: No lower extremity tenderness nor edema.  No joint effusions.  Right PICC line Neurologic:  Normal speech and language. No gross focal neurologic deficits are appreciated.  Patient able to squeeze bilateral hands with good strength.  Able to lift the bilateral legs Skin:  Skin is warm, dry and intact. No rash noted. Psychiatric: Mood and affect are normal. Speech and behavior are normal. GU: Deferred   ____________________________________________   LABS (all labs ordered are listed, but only abnormal results are displayed)  Labs Reviewed  CBG MONITORING, ED - Abnormal; Notable for the following components:      Result Value   Glucose-Capillary 185 (*)    All other components within normal limits  BASIC METABOLIC PANEL  CBC  URINALYSIS, COMPLETE (UACMP) WITH MICROSCOPIC  CBG MONITORING, ED   ____________________________________________   ED ECG REPORT I, Vanessa [place], the attending physician, personally viewed and interpreted this ECG.  Normal sinus rate of 72, no ST elevation, T wave versions in V2 and V3, normal intervals.  T wave inversions do appear  new ____________________________________________  RADIOLOGY   Official radiology report(s): IR Fluoro Guide CV Line Left  Result Date: 07/27/2020 INDICATION: 80 year old female with pancreatic carcinoma, prior internal external biliary drain now with suspected occlusion. She presents for IV access for possible chemotherapy and for exchange of the nonfunctional biliary drain. EXAM: IMAGE GUIDED RIGHT UPPER EXTREMITY PICC IMAGE GUIDED EXCHANGE INTERNAL EXTERNAL BILIARY DRAIN MEDICATIONS: None ANESTHESIA/SEDATION: Moderate (conscious) sedation was employed during this procedure. A total of Versed 2.0 mg and Fentanyl 100 mcg was administered intravenously. Moderate Sedation Time: 20 minutes. The patient's level of consciousness and vital signs were monitored continuously by radiology nursing throughout the procedure under my direct supervision. FLUOROSCOPY TIME:  Fluoroscopy Time: 1 minutes 0 seconds (14.7 mGy). COMPLICATIONS: None PROCEDURE: Informed written consent was obtained from the patient after a thorough discussion of the procedural risks, benefits and alternatives. All questions were addressed. Maximal Sterile Barrier Technique was utilized including caps, mask, sterile gowns, sterile gloves, sterile drape, hand hygiene and skin antiseptic. A timeout was performed prior to the initiation of the procedure. Patient was position in the supine position on the fluoroscopy table with the right arm abducted 90 degrees. Ultrasound survey of the upper extremity was performed with images stored and sent to PACs. The right brachial vein was selected for access. Once the patient was prepped and draped in the usual sterile fashion, the skin and subcutaneous tissues were generously infiltrated with 1% lidocaine for local anesthesia. A micropuncture access kit was then used to access the targeted vein. Wire was passed centrally, confirmed to be within the venous system under fluoroscopy. A small stab incision was  made with an 11 blade scalpel and the sheath was then placed over the wire. Estimated length of the catheter was then performed with the indwelling wire. Catheter was amputated at 39 cm length and placed with coaxial wire through the peel-away. Double lumen, power injectable PICC in the brachial vein. Tip confirmed at the cavoatrial junction, and the catheter is ready for use. Stat lock was placed. We then address the nonfunctional internal/external biliary drain. Initial images of the abdomen demonstrates that the drain has been withdrawn compared to the position on the prior placement Contrast injected through the drain demonstrates some resistance. The catheter was ligated, 1% lidocaine was administered at the skin, and the retention suture was cut. A Bentson wire was passed, which met some resistance in the drain,  likely debris. Wire was passed to the duodenum. Catheter was then withdrawn on the wire and a new 12 French 45 cm internal/external biliary drain was placed via the left approach. Contrast was injected confirming the location. Drain was sutured in position and attached to gravity drainage Patient tolerated the procedure well and remained hemodynamically stable throughout. No complications were encountered and no significant blood loss. IMPRESSION: Status post right upper extremity brachial vein PICC, double lumen, ready for use. Status post exchange of partially occluded internal/external biliary drain with a new 12 French drain placed to gravity. Signed, Dulcy Fanny. Dellia Nims, RPVI [facility] Radiology Electronically Signed   By: Corrie Mckusick D.O.   On: 07/27/2020 14:59   IR EXCHANGE BILIARY DRAIN  Result Date: 07/27/2020 INDICATION: 80 year old female with pancreatic carcinoma, prior internal external biliary drain now with suspected occlusion. She presents for IV access for possible chemotherapy and for exchange of the nonfunctional biliary drain.  EXAM: IMAGE GUIDED RIGHT UPPER EXTREMITY PICC IMAGE GUIDED EXCHANGE INTERNAL EXTERNAL BILIARY DRAIN MEDICATIONS: None ANESTHESIA/SEDATION: Moderate (conscious) sedation was employed during this procedure. A total of Versed 2.0 mg and Fentanyl 100 mcg was administered intravenously. Moderate Sedation Time: 20 minutes. The patient's level of consciousness and vital signs were monitored continuously by radiology nursing throughout the procedure under my direct supervision. FLUOROSCOPY TIME:  Fluoroscopy Time: 1 minutes 0 seconds (14.7 mGy). COMPLICATIONS: None PROCEDURE: Informed written consent was obtained from the patient after a thorough discussion of the procedural risks, benefits and alternatives. All questions were addressed. Maximal Sterile Barrier Technique was utilized including caps, mask, sterile gowns, sterile gloves, sterile drape, hand hygiene and skin antiseptic. A timeout was performed prior to the initiation of the procedure. Patient was position in the supine position on the fluoroscopy table with the right arm abducted 90 degrees. Ultrasound survey of the upper extremity was performed with images stored and sent to PACs. The right brachial vein was selected for access. Once the patient was prepped and draped in the usual sterile fashion, the skin and subcutaneous tissues were generously infiltrated with 1% lidocaine for local anesthesia. A micropuncture access kit was then used to access the targeted vein. Wire was passed centrally, confirmed to be within the venous system under fluoroscopy. A small stab incision was made with an 11 blade scalpel and the sheath was then placed over the wire. Estimated length of the catheter was then performed with the indwelling wire. Catheter was amputated at 39 cm length and placed with coaxial wire through the peel-away. Double lumen, power injectable PICC in the brachial vein. Tip confirmed at the cavoatrial junction, and the catheter is ready for use. Stat lock  was placed. We then address the nonfunctional internal/external biliary drain. Initial images of the abdomen demonstrates that the drain has been withdrawn compared to the position on the prior placement Contrast injected through the drain demonstrates some resistance. The catheter was ligated, 1% lidocaine was administered at the skin, and the retention suture was cut. A Bentson wire was passed, which met some resistance in the drain, likely debris. Wire was passed to the duodenum. Catheter was then withdrawn on the wire and a new 12 French 45 cm internal/external biliary drain was placed via the left approach. Contrast was injected confirming the location. Drain was sutured in position and attached to gravity drainage Patient tolerated the procedure well and remained hemodynamically stable throughout. No complications were encountered and no significant blood loss. IMPRESSION: Status post right  upper extremity brachial vein PICC, double lumen, ready for use. Status post exchange of partially occluded internal/external biliary drain with a new 12 French drain placed to gravity. Signed, Dulcy Fanny. Dellia Nims, RPVI [facility] Inter[facility] Radiology Specialists [facility] Radiology Electronically Signed   By: Corrie Mckusick D.O.   On: 07/27/2020 14:59    ____________________________________________   PROCEDURES  Procedure(s) performed (including Critical Care):  .1-3 Lead EKG Interpretation Performed by: Vanessa [place], MD Authorized by: Vanessa [place], MD     Interpretation: normal     ECG rate:  80s    ECG rate assessment: normal     Rhythm: sinus rhythm     Ectopy: none     Conduction: normal       ____________________________________________   INITIAL IMPRESSION / ASSESSMENT AND PLAN / ED COURSE  Tracy Bolton was evaluated in Emergency Department on 07/28/2020 for the symptoms described in the history of present illness. She was evaluated in the context of the global COVID-19 pandemic,  which necessitated consideration that the patient might be at risk for infection with the SARS-CoV-2 virus that causes COVID-19. Institutional protocols and algorithms that pertain to the evaluation of patients at risk for COVID-19 are in a state of rapid change based on information released by regulatory bodies including the CDC and federal and state organizations. These policies and algorithms were followed during the patient's care in the ED.    Patient is a 80 year old who comes in hypotensive.  This likely secondary to dehydration.  Will give 1 L of fluid and some Zofran to help with nausea.  Will get CT head to evaluate for intercranial mass, CT abdomen to evaluate for obstruction per oncology.  No chest pain or shortness of breath but will get x-ray to rule out pneumonia and given EKG does have some T wave inversions will get cardiac markers and keep patient on the cardiac monitor.  Patient's lactate is 3.2.  Patient is given IV fluid.  Patient's total bili is elevated but similar to a few days ago.  Her kidney function has increased most likely secondary to her dehydration.  There is no evidence of heart attack.  No evidence of significant ammonia elevation to suggest hepatic encephalopathy.  11:04 AM reevaluated patient.  Patient is looking better blood pressures are increasing.  Patient will be going to CT scanner soon  Discussed with Dr. Rogue Bussing he suspects the LFT elevation is secondary to the recent drain placement but he is concerned about the possibility of duodenal obstruction that may need to have stenting done.  Discussed with patient that she needs to have a barium swallow done but she does not feel like she can tolerate p.o. and there would be high risk for aspiration.  Will discuss possible team to admit to get IV fluids and continue nausea medicine and hopefully later tonight or tomorrow she can undergo the barium swallow.         ____________________________________________   FINAL CLINICAL IMPRESSION(S) / ED DIAGNOSES   Final diagnoses:  Nausea & vomiting  AKI (acute kidney injury) ([place])  Hypotension, unspecified hypotension type      MEDICATIONS GIVEN DURING THIS VISIT:  Medications  promethazine (PHENERGAN) 6.25 mg in sodium chloride 0.9 % 50 mL IVPB (has no administration in time range)  sodium chloride 0.9 % bolus 1,000 mL (0 mLs Intravenous Stopped 07/28/20 1255)  ondansetron (ZOFRAN) injection 4 mg (4 mg Intravenous Given 07/28/20 1023)     ED Discharge Orders  None       Note:  This document was prepared using Dragon voice recognition software and may include unintentional dictation errors.   Vanessa [place], MD 07/28/20 [phone]

## 2020-07-28 NOTE — Progress Notes (Signed)
[name] was admitted to 201 from the ED.  Pt with pancreatic cancer and presented to interventional radiology yesterday for a venous access placement. Noted to have and elevated bilirubin. Pt denies pain at this time and VSS.

## 2020-07-29 ENCOUNTER — Ambulatory Visit: Payer: Medicare PPO

## 2020-07-29 DIAGNOSIS — N179 Acute kidney failure, unspecified: Secondary | ICD-10-CM | POA: Diagnosis present

## 2020-07-29 DIAGNOSIS — Z808 Family history of malignant neoplasm of other organs or systems: Secondary | ICD-10-CM | POA: Diagnosis not present

## 2020-07-29 DIAGNOSIS — Z6832 Body mass index (BMI) 32.0-32.9, adult: Secondary | ICD-10-CM | POA: Diagnosis not present

## 2020-07-29 DIAGNOSIS — K315 Obstruction of duodenum: Secondary | ICD-10-CM | POA: Diagnosis present

## 2020-07-29 DIAGNOSIS — Z79899 Other long term (current) drug therapy: Secondary | ICD-10-CM | POA: Diagnosis not present

## 2020-07-29 DIAGNOSIS — K219 Gastro-esophageal reflux disease without esophagitis: Secondary | ICD-10-CM | POA: Diagnosis present

## 2020-07-29 DIAGNOSIS — C250 Malignant neoplasm of head of pancreas: Secondary | ICD-10-CM | POA: Diagnosis present

## 2020-07-29 DIAGNOSIS — N1831 Chronic kidney disease, stage 3a: Secondary | ICD-10-CM | POA: Diagnosis present

## 2020-07-29 DIAGNOSIS — R4182 Altered mental status, unspecified: Secondary | ICD-10-CM

## 2020-07-29 DIAGNOSIS — K831 Obstruction of bile duct: Secondary | ICD-10-CM

## 2020-07-29 DIAGNOSIS — C259 Malignant neoplasm of pancreas, unspecified: Secondary | ICD-10-CM | POA: Diagnosis not present

## 2020-07-29 DIAGNOSIS — Z20822 Contact with and (suspected) exposure to covid-19: Secondary | ICD-10-CM | POA: Diagnosis present

## 2020-07-29 DIAGNOSIS — K920 Hematemesis: Secondary | ICD-10-CM

## 2020-07-29 DIAGNOSIS — Z515 Encounter for palliative care: Secondary | ICD-10-CM | POA: Diagnosis not present

## 2020-07-29 DIAGNOSIS — Z7984 Long term (current) use of oral hypoglycemic drugs: Secondary | ICD-10-CM | POA: Diagnosis not present

## 2020-07-29 DIAGNOSIS — R627 Adult failure to thrive: Secondary | ICD-10-CM | POA: Diagnosis present

## 2020-07-29 DIAGNOSIS — M1A9XX0 Chronic gout, unspecified, without tophus (tophi): Secondary | ICD-10-CM | POA: Diagnosis present

## 2020-07-29 DIAGNOSIS — E785 Hyperlipidemia, unspecified: Secondary | ICD-10-CM | POA: Diagnosis present

## 2020-07-29 DIAGNOSIS — K922 Gastrointestinal hemorrhage, unspecified: Secondary | ICD-10-CM

## 2020-07-29 DIAGNOSIS — I959 Hypotension, unspecified: Secondary | ICD-10-CM | POA: Diagnosis present

## 2020-07-29 DIAGNOSIS — Z8349 Family history of other endocrine, nutritional and metabolic diseases: Secondary | ICD-10-CM | POA: Diagnosis not present

## 2020-07-29 DIAGNOSIS — R112 Nausea with vomiting, unspecified: Secondary | ICD-10-CM | POA: Diagnosis present

## 2020-07-29 DIAGNOSIS — E669 Obesity, unspecified: Secondary | ICD-10-CM | POA: Diagnosis present

## 2020-07-29 DIAGNOSIS — I129 Hypertensive chronic kidney disease with stage 1 through stage 4 chronic kidney disease, or unspecified chronic kidney disease: Secondary | ICD-10-CM | POA: Diagnosis present

## 2020-07-29 LAB — CBC
HCT: 34.3 % — ABNORMAL LOW (ref 36.0–46.0)
Hemoglobin: 11.2 g/dL — ABNORMAL LOW (ref 12.0–15.0)
MCH: 27.7 pg (ref 26.0–34.0)
MCHC: 32.7 g/dL (ref 30.0–36.0)
MCV: 84.7 fL (ref 80.0–100.0)
Platelets: 166 10*3/uL (ref 150–400)
RBC: 4.05 MIL/uL (ref 3.87–5.11)
RDW: 18 % — ABNORMAL HIGH (ref 11.5–15.5)
WBC: 11.2 10*3/uL — ABNORMAL HIGH (ref 4.0–10.5)
nRBC: 0.2 % (ref 0.0–0.2)

## 2020-07-29 LAB — BASIC METABOLIC PANEL
Anion gap: 15 (ref 5–15)
BUN: 41 mg/dL — ABNORMAL HIGH (ref 8–23)
CO2: 21 mmol/L — ABNORMAL LOW (ref 22–32)
Calcium: 9.4 mg/dL (ref 8.9–10.3)
Chloride: 110 mmol/L (ref 98–111)
Creatinine, Ser: 1.65 mg/dL — ABNORMAL HIGH (ref 0.44–1.00)
GFR, Estimated: 32 mL/min — ABNORMAL LOW (ref >60)
Glucose, Bld: 129 mg/dL — ABNORMAL HIGH (ref 70–99)
Potassium: 3.5 mmol/L (ref 3.5–5.1)
Sodium: 146 mmol/L — ABNORMAL HIGH (ref 135–145)

## 2020-07-29 LAB — HEMOGLOBIN AND HEMATOCRIT, BLOOD
HCT: 36.4 % (ref 36.0–46.0)
Hemoglobin: 11.7 g/dL — ABNORMAL LOW (ref 12.0–15.0)

## 2020-07-29 LAB — OCCULT BLOOD GASTRIC / DUODENUM (SPECIMEN CUP)
Occult Blood, Gastric: POSITIVE — AB
pH, Gastric: 6

## 2020-07-29 MED ORDER — CHLORHEXIDINE GLUCONATE CLOTH 2 % EX PADS
6.0000 | MEDICATED_PAD | Freq: Every day | CUTANEOUS | Status: DC
  Administered 2020-07-29 – 2020-07-31 (×3): 6 via TOPICAL

## 2020-07-29 MED ORDER — OXYCODONE HCL 5 MG PO TABS: 5.0000 mg | ORAL_TABLET | Freq: Four times a day (QID) | ORAL | Status: DC | PRN

## 2020-07-29 MED ORDER — PANTOPRAZOLE SODIUM 40 MG IV SOLR
40.0000 mg | Freq: Two times a day (BID) | INTRAVENOUS | Status: DC
  Administered 2020-07-29 – 2020-07-31 (×5): 40 mg via INTRAVENOUS
  Filled 2020-07-29 (×5): qty 40

## 2020-07-29 MED ORDER — LACTATED RINGERS IV SOLN: INTRAVENOUS | Status: AC

## 2020-07-29 MED ORDER — MORPHINE SULFATE (PF) 2 MG/ML IV SOLN: 1.0000 mg | INTRAVENOUS | Status: DC | PRN

## 2020-07-29 MED ORDER — PANTOPRAZOLE SODIUM 40 MG IV SOLR: 40.0000 mg | Freq: Two times a day (BID) | INTRAVENOUS | Status: DC

## 2020-07-29 NOTE — Progress Notes (Signed)
Cross Cover Patient with coffee ground emesis, gastroccult +, and 2 gm drop hemoglobin. Discontinue SQ heparin and aspirin.  Started on BID PPI.  She is hemodynamically stable. Further management per day attending

## 2020-07-29 NOTE — Consult Note (Signed)
Tracy Bolton NOTE  Patient Care Team: Delsa Grana, PA-C as PCP - General (Family Medicine) Loney Loh, Michaell Cowing, MD as Referring Physician (Endocrinology) Olean Ree, MD as Consulting Physician (General Surgery) Clent Jacks, RN as Oncology Nurse Navigator  CHIEF COMPLAINTS/PURPOSE OF CONSULTATION: Pancreatic cancer  HISTORY OF PRESENTING ILLNESS:  Tracy Bolton 80 y.o.  female patient with recently diagnosed pancreatic cancer is currently admitted to the hospital for hypotension/jaundice/mental status changes/acute renal failure.  Patient recently diagnosed with pancreatic cancer locally advanced involving duodenum/and also biliary obstruction.  Patient underwent exchange of partially occluded biliary drain on 5/04.   Patient was seen in the clinic the following day for consideration of chemotherapy-patient was noted to have hypotension blood pressure in 80s; mental status changes/lethargy.  Patient underwent evaluation emergency room-showed elevated bilirubin; acute renal failure creatinine up to 1.8.  Patient blood pressure improved post IV fluids.  Patient's morning noted to have episodes of hematemesis.  Tracy Bolton is a 80 y.o. female with medical history significant for pancreatic cancer diagnosed in February 2022, presents to the emergency department from oncology clinic for chief concerns of intractable nausea and vomiting.  S/p IV fluids patient is more lucid.  Patient denies any significant pain.   Review of Systems  Constitutional: Positive for malaise/fatigue and weight loss. Negative for chills, diaphoresis and fever.  HENT: Negative for nosebleeds and sore throat.   Eyes: Negative for double vision.  Respiratory: Negative for cough, hemoptysis, sputum production, shortness of breath and wheezing.   Cardiovascular: Negative for chest pain, palpitations, orthopnea and leg swelling.  Gastrointestinal: Positive for abdominal pain and vomiting.  Negative for blood in stool, constipation, diarrhea, heartburn, melena and nausea.  Genitourinary: Negative for dysuria, frequency and urgency.  Musculoskeletal: Negative for back pain and joint pain.  Skin: Negative.  Negative for itching and rash.  Neurological: Positive for weakness. Negative for dizziness, tingling, focal weakness and headaches.  Endo/Heme/Allergies: Does not bruise/bleed easily.  Psychiatric/Behavioral: Negative for depression. The patient is not nervous/anxious and does not have insomnia.      MEDICAL HISTORY:  Past Medical History:  Diagnosis Date  . AKI (acute kidney injury) ([place]) 07/28/2020  . Arthritis   . Gout, chronic   . Hyperlipidemia   . Hypertension   . Osteoporosis   . Thyroid disease     SURGICAL HISTORY: Past Surgical History:  Procedure Laterality Date  . CATARACT EXTRACTION, BILATERAL  02/2018  . COLONOSCOPY  2016  . IR BILIARY DRAIN PLACEMENT WITH CHOLANGIOGRAM  07/08/2020  . IR EXCHANGE BILIARY DRAIN  07/27/2020  . IR FLUORO GUIDE CV LINE LEFT  07/27/2020  . LUNG SURGERY Right 2004   biopsy normal    SOCIAL HISTORY: Social History   Socioeconomic History  . Marital status: Single    Spouse name: Not on file  . Number of children: 2  . Years of education: Not on file  . Highest education level: Some college, no degree  Occupational History  . Occupation: retired  Tobacco Use  . Smoking status: Never Smoker  . Smokeless tobacco: Never Used  Vaping Use  . Vaping Use: Never used  Substance and Sexual Activity  . Alcohol use: No    Alcohol/week: 0.0 standard drinks  . Drug use: No  . Sexual activity: Not Currently  Other Topics Concern  . Not on file  Social History Narrative   Pt lives alone. With Cecilia- [facility] [closely -daughter, salibury]. Never smoked; no alcohol. Worked for  [employer]- [employer]; retd [0865]    Social Determinants of Health   Financial Resource Strain: Low Risk   . Difficulty of Paying Living Expenses: Not hard  at all  Food Insecurity: No Food Insecurity  . Worried About Charity fundraiser in the Last Year: Never true  . Ran Out of Food in the Last Year: Never true  Transportation Needs: No Transportation Needs  . Lack of Transportation (Medical): No  . Lack of Transportation (Non-Medical): No  Physical Activity: Insufficiently Active  . Days of Exercise per Week: 7 days  . Minutes of Exercise per Session: 20 min  Stress: No Stress Concern Present  . Feeling of Stress : Not at all  Social Connections: Moderately Integrated  . Frequency of Communication with Friends and Family: More than three times a week  . Frequency of Social Gatherings with Friends and Family: More than three times a week  . Attends Religious Services: More than 4 times per year  . Active Member of Clubs or Organizations: Yes  . Attends Archivist Meetings: More than 4 times per year  . Marital Status: Never married  Intimate Partner Violence: Not At Risk  . Fear of Current or Ex-Partner: No  . Emotionally Abused: No  . Physically Abused: No  . Sexually Abused: No    FAMILY HISTORY: Family History  Problem Relation Age of Onset  . Bone cancer Father 25       not biological father  . Thyroid disease Sister   . Cancer Mother 49       brain tumor  . Breast cancer Neg Hx     ALLERGIES:  has No Known Allergies.  MEDICATIONS:  Current Facility-Administered Medications  Medication Dose Route Frequency Provider Last Rate Last Admin  . acetaminophen (TYLENOL) tablet 650 mg  650 mg Oral Q6H PRN Cox, Amy N, DO       Or  . acetaminophen (TYLENOL) suppository 650 mg  650 mg Rectal Q6H PRN Cox, Amy N, DO      . allopurinol (ZYLOPRIM) tablet 100 mg  100 mg Oral Daily Cox, Amy N, DO   100 mg at 07/28/20 1443  . Chlorhexidine Gluconate Cloth 2 % PADS 6 each  6 each Topical Daily Fritzi Mandes, MD   6 each at 07/29/20 1328  . lactated ringers infusion   Intravenous Continuous Fritzi Mandes, MD 125 mL/hr at 07/29/20  1736 New Bag at 07/29/20 1736  . morphine 2 MG/ML injection 1 mg  1 mg Intravenous Q4H PRN Cox, Amy N, DO      . ondansetron (ZOFRAN) tablet 4 mg  4 mg Oral Q6H PRN Cox, Amy N, DO       Or  . ondansetron (ZOFRAN) injection 4 mg  4 mg Intravenous Q6H PRN Cox, Amy N, DO   4 mg at 07/28/20 1747  . oxyCODONE (Oxy IR/ROXICODONE) immediate release tablet 5 mg  5 mg Oral Q6H PRN Cox, Amy N, DO      . pantoprazole (PROTONIX) injection 40 mg  40 mg Intravenous Q12H Sharion Settler, NP   40 mg at 07/29/20 1040  . prochlorperazine (COMPAZINE) injection 10 mg  10 mg Intravenous Q6H PRN Sharion Settler, NP   10 mg at 07/28/20 2343   Facility-Administered Medications Ordered in Other Encounters  Medication Dose Route Frequency Provider Last Rate Last Admin  . 0.9 %  sodium chloride infusion   Intravenous Continuous Theresa Duty, NP          .  PHYSICAL EXAMINATION:  Vitals:   07/29/20 0759 07/29/20 1604  BP: 98/71 118/71  Pulse: 84 71  Resp: 16 17  Temp: 98 F (36.7 C) 98 F (36.7 C)  SpO2: 99% 100%   Filed Weights   07/28/20 0935  Weight: 176 lb 5.9 oz (80 kg)    Physical Exam Constitutional:      Comments: Tracy Bolton African-American female patient.  Resting in the bed.  Accompanied by daughter/son-in-law.  HENT:     Head: Normocephalic and atraumatic.     Mouth/Throat:     Pharynx: No oropharyngeal exudate.  Eyes:     General: Scleral icterus present.     Pupils: Pupils are equal, round, and reactive to light.  Cardiovascular:     Rate and Rhythm: Normal rate and regular rhythm.  Pulmonary:     Effort: No respiratory distress.     Breath sounds: No wheezing.     Comments: Decreased breath sounds at the bases. Abdominal:     General: Bowel sounds are normal. There is no distension.     Palpations: Abdomen is soft. There is no mass.     Tenderness: There is no abdominal tenderness. There is no guarding or rebound.     Comments: No rigidity or guarding.   Musculoskeletal:        General: No tenderness. Normal range of motion.     Cervical back: Normal range of motion and neck supple.  Skin:    General: Skin is warm.  Neurological:     Mental Status: She is alert and oriented to person, place, and time.  Psychiatric:        Mood and Affect: Affect normal.      LABORATORY DATA:  I have reviewed the data as listed Lab Results  Component Value Date   WBC 11.2 (H) 07/29/2020   HGB 11.2 (L) 07/29/2020   HCT 34.3 (L) 07/29/2020   MCV 84.7 07/29/2020   PLT 166 07/29/2020   Recent Labs    10/06/19 1056 04/08/20 1139 05/22/20 2212 07/22/20 0815 07/28/20 0850 07/28/20 1000 07/29/20 0404  NA 140 140   < > 139 144  --  146*  K 4.6 4.3   < > 4.0 3.9  --  3.5  CL 102 103   < > 100 105  --  110  CO2 26 24   < > 21* 19*  --  21*  GLUCOSE 150* 156*   < > 132* 188*  --  129*  BUN 20 22   < > 21 41*  --  41*  CREATININE 1.17* 0.96*   < > 1.32* 2.13*  --  1.65*  CALCIUM 10.0 10.0   < > 10.0 10.2  --  9.4  GFRNONAA 45* 57*   < > 42* 23*  --  32*  GFRAA 52* 66  --   --   --   --   --   PROT 7.2 7.1   < > 8.5* 8.6* 8.6*  --   ALBUMIN  --   --    < > 3.5 3.6 3.7  --   AST 21 17   < > 251* 251* 239*  --   ALT 15 9   < > 123* 148* 145*  --   ALKPHOS  --   --    < > 137* 119 121  --   BILITOT 0.9 1.0   < > 6.5* 6.7* 6.8*  --   BILIDIR  --   --   --  3.1*  --  3.2*  --   IBILI  --   --   --  3.4*  --  3.6*  --    < > = values in this interval not displayed.    RADIOGRAPHIC STUDIES: I have personally reviewed the radiological images as listed and agreed with the findings in the report. CT ABDOMEN PELVIS WO CONTRAST  Result Date: 07/28/2020 CLINICAL DATA:  Abdominal distension, lethargic and hypotensive. History of pancreatic cancer in this 80 year old female with transit padded a percutaneous biliary drainage catheter in place. EXAM: CT ABDOMEN AND PELVIS WITHOUT CONTRAST TECHNIQUE: Multidetector CT imaging of the abdomen and pelvis was  performed following the standard protocol without IV contrast. COMPARISON:  May 23, 2020 and PET imaging as well as MR imaging since that time most recent cross-sectional comparison from July 14, 2020. FINDINGS: Lower chest: Nodular area along the major fissure in the RIGHT chest stable when compared to recent PET-CT 10 x 7 mm. No effusion. No consolidation. Hepatobiliary: Liver with smooth contours. Percutaneous biliary drainage catheter in place, tip in the duodenum traversing LEFT hepatic lobe with side-hole marker well within the hepatic substance. Side-hole marker 5 cm into the liver. No perihepatic stranding at the site of entry. Contrast mixed with numerous stones in the gallbladder. Gallbladder is distended without signs of pericholecystic stranding or fluid. No focal hepatic abnormality. Pancreas: Pancreas with stable appearance compared to recent PET imaging aside from potential mild peripancreatic stranding. Ill-defined mass in the pancreatic head not grossly changed. Spleen: Normal spleen. Adrenals/Urinary Tract: Normal appearance of adrenal glands. No perinephric stranding. No hydronephrosis. No perivesical stranding. No nephrolithiasis. Stomach/Bowel: No perianal stranding. Colonic diverticulosis without signs of diverticulitis. Normal appendix. Small hiatal hernia. No perigastric stranding. Small bowel is normal in caliber. No adjacent stranding. Vascular/Lymphatic: Calcified atheromatous plaque of the abdominal aorta. Small lymph nodes adjacent to pancreas not well evaluated. No new retroperitoneal adenopathy. Insert no pelvic adenopathy. Reproductive: No adnexal masses. Other: No free air. No ascites. Small fat containing umbilical hernia. Musculoskeletal: Spinal degenerative changes. No acute or destructive bone process. Degenerative changes of the bilateral hips with severe degenerative change. IMPRESSION: 1. Query mild peripancreatic stranding. Correlate with pancreatic enzymes. 2.  Percutaneous biliary drainage catheter in place, tip in the duodenum traversing LEFT hepatic lobe with side-hole marker well within the hepatic substance. w no acute hepatic findings on noncontrast imaging. 3. Cholelithiasis with contrast in a distended gallbladder. No adjacent stranding or fluid. 4. Ill-defined mass in the pancreatic head not grossly changed. 5. Colonic diverticulosis without signs of diverticulitis. 6. Nodular area along the major fissure in the RIGHT chest stable when compared to recent PET-CT 10 x 7 mm. Attention on follow-up. 7. Aortic atherosclerosis. Aortic Atherosclerosis (ICD10-I70.0). Electronically Signed   By: Zetta Bills M.D.   On: 07/28/2020 11:56   CT Head Wo Contrast  Result Date: 07/28/2020 CLINICAL DATA:  Migraine-type headache.  Lethargy.  Hypotension. EXAM: CT HEAD WITHOUT CONTRAST TECHNIQUE: Contiguous axial images were obtained from the base of the skull through the vertex without intravenous contrast. COMPARISON:  08/21/2013. FINDINGS: Brain: No evidence of acute infarction, hemorrhage, hydrocephalus, extra-axial collection or mass lesion/mass effect. Vascular: No hyperdense vessel or unexpected calcification. Skull: Normal. Negative for fracture or focal lesion. Sinuses/Orbits: Visualized globes and orbits are unremarkable. Visualized sinuses are clear. Other: None. IMPRESSION: Negative unenhanced CT scan of the brain for age. Electronically Signed   By: Lajean Manes M.D.   On: 07/28/2020 11:36   [place] Intraoperative  Result Date: 07/08/2020 INDICATION: Jaundice, biliary obstruction, pancreas cancer, successful ERCP EXAM: ULTRASOUND FLUOROSCOPIC LEFT INTERNAL EXTERNAL 10 FRENCH BILIARY DRAIN MEDICATIONS: 2 g Mefoxin; The antibiotic was administered within an appropriate time frame prior to the initiation of the procedure. ANESTHESIA/SEDATION: Moderate (conscious) sedation was employed during this procedure. A total of Versed 2.0 mg and Fentanyl 150 mcg was  administered intravenously. Moderate Sedation Time: 32 minutes. The patient's level of consciousness and vital signs were monitored continuously by radiology nursing throughout the procedure under my direct supervision. FLUOROSCOPY TIME:  Fluoroscopy Time: 5 minutes 12 seconds (89 mGy). COMPLICATIONS: None immediate. PROCEDURE: Informed written consent was obtained from the patient after a thorough discussion of the procedural risks, benefits and alternatives. All questions were addressed. Maximal Sterile Barrier Technique was utilized including caps, mask, sterile gowns, sterile gloves, sterile drape, hand hygiene and skin antiseptic. A timeout was performed prior to the initiation of the procedure. Previous imaging reviewed. Preliminary ultrasound performed of the liver. Left hepatic ducts were localized from a subxiphoid approach. Overlying skin marked. Under sterile conditions and local anesthesia, a 21 gauge 15 cm access was advanced percutaneously under ultrasound into a peripheral left hepatic duct. Needle position confirmed with ultrasound and return of clear bile. Ultrasound images obtained for documentation. Contrast injection performed for initial cholangiogram. This demonstrates diffuse biliary dilatation and distal obstruction. 018 guidewire advanced into the biliary tree followed by the transitional dilator set. Amplatz guidewire advanced followed by a Kumpe catheter. Kumpe catheter advanced into the duodenum. Contrast injection confirms position in the duodenum. Over an Amplatz guidewire, tract dilatation performed to insert a 10 [place] internal external biliary drain. Retention loop positioned in the duodenum. Contrast injection confirms position. Images obtained for documentation. Cholangiogram confirms biliary obstruction. Cystic duct is patent with numerous gallstones noted. IMPRESSION: Successful 10 French left internal external biliary drain for distal CBD obstruction. Electronically Signed    By: Jerilynn Mages.  Shick M.D.   On: 07/08/2020 12:19   NM PET Image Initial (PI) Skull Base To Thigh  Addendum Date: 07/14/2020   ADDENDUM REPORT: 07/14/2020 16:13 ADDENDUM: Note that there is some mild to moderate airway narrowing at the thoracic inlet. The patient has a chronic mass in this area. Would still however correlate with any respiratory symptoms given the marked displacement of the trachea. For reference the trachea in the upper chest is 15 mm and at the thoracic inlet is 9 mm greatest width. Note also that nodular areas in the chest are without increased metabolic activity. These results will be called to the ordering clinician or representative by the Radiologist Assistant, and communication documented in the PACS or [employer]. Electronically Signed   By: Zetta Bills M.D.   On: 07/14/2020 16:13   Result Date: 07/14/2020 CLINICAL DATA:  Initial treatment strategy for pancreatic cancer. EXAM: NUCLEAR MEDICINE PET SKULL BASE TO THIGH TECHNIQUE: 10.2 mCi F-18 FDG was injected intravenously. Full-ring PET imaging was performed from the skull base to thigh after the radiotracer. CT data was obtained and used for attenuation correction and anatomic localization. Fasting blood glucose: CT of the abdomen and pelvis from May 23, 2020 mg/dl COMPARISON:  Abdominal CT from May 23, 2020 FINDINGS: Mediastinal blood pool activity: SUV max 3.7 Liver activity: SUV max NA NECK: Large RIGHT neck mass 9.6 x 7.8 cm with areas of increased FDG uptake with maximum SUV of 5.9. There is tracheal deviation that is been described on multiple prior chest x-rays dating back to 2006. No discrete adenopathy is  present at the thoracic inlet or in the neck. Normal anatomy is highly distorted and the airway is narrowed secondary to mass effect from this thoracic inlet/neck mass that extends from the submandibular region into the upper chest. Incidental CT findings: none CHEST: No hypermetabolic mediastinal or hilar nodes.  Scattered pulmonary nodules, described below show no increased metabolic activity above blood pool largest in the RIGHT chest with a maximum SUV of 1.2 Incidental CT findings: Calcified atheromatous plaque of the thoracic aorta. Nonaneurysmal. Mildly engorged central pulmonary vasculature 3.2 cm. Mitral annular calcifications. Normal heart size. No pericardial effusion. No adenopathy by size criteria in the chest low-density area measuring water density in the mediastinum may represent a small pericardial cyst seen on image 62 and 63 of series 3 and shows no increased FDG uptake. Basilar atelectasis. Areas of nodularity are present, 1 area along the major fissure in the RIGHT chest measures 10 x 7 mm. Apical pleural scarring. Ill-defined nodule in the LEFT upper lobe measuring 4 mm. Another in the LEFT upper lobe measuring 4 mm on images 53 and 57 respectively. Pulmonary emphysema. ABDOMEN/PELVIS: Large hypermetabolic mass in the pancreatic-duodenal groove with a maximum SUV of 30 corresponding to the abnormality seen on prior images, difficult to separate from duodenum on the current exam not grossly changed compared to the recent MRI evaluation Heterogeneous hepatic activity without discrete lesion. Increased metabolic uptake along the patient's biliary drainage catheter passing through the LEFT hepatic lobe. Lymph node adjacent to the pancreatic head measuring approximately 12 mm with increased metabolic activity (image 616, series 3. Metabolic activity is difficult to separate from the adjacent pancreatic mass maximum SUV estimated to be in the range of 6.5. Hepatic gastric/celiac nodes, a cluster of lymph nodes seen on image 114 of series 3 also with some dilated vascular channels in this area metabolic activity at approximately 3.5 maximum SUV, largest node in this area approximately 6 mm. No retroperitoneal adenopathy below the renal veins. Diffuse bowel activity Incidental CT findings: Cholelithiasis.  Numerous gallstones in the gallbladder. Spleen, adrenal glands and kidneys without acute process. No perinephric stranding. Urinary bladder collapsed. No acute gastrointestinal process. Normal appendix. Uterus and adnexa unremarkable by CT. Small fat containing umbilical hernia. SKELETON: No focal hypermetabolic activity to suggest skeletal metastasis. 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 Spinal degenerative changes. IMPRESSION: Pancreatic mass with local adenopathy now with biliary drain in place. Signs of peripancreatic nodal enlargement with smaller nodes in the gastrohepatic recess. No definitive evidence of distant metastatic disease. Areas of ill-defined nodularity in the chest most notably in the RIGHT mid chest along the minor fissure, of uncertain significance. The dominant area shows tubular morphology. Close attention on follow-up is suggested, consider short interval follow-up for further evaluation. Large mediastinal/lower lobe neck mass may represent a large border and is been commented on on multiple prior chest x-rays. No comparison cross-sectional imaging is available. This area shows increased FDG uptake. Focused ultrasound and biopsy is suggested. Recommend thyroid US and biopsy yes sarcasm just cleanup this 1 finger and there is(ref: J Am Coll Radiol. 2015 Feb;12(2): 143-50). Electronically Signed: By: Zetta Bills M.D. On: 07/14/2020 15:53   IR Fluoro Guide CV Line Left  Result Date: 07/27/2020 INDICATION: 80 year old female with pancreatic carcinoma, prior internal external biliary drain now with suspected occlusion. She presents for IV access for possible chemotherapy and for exchange of the nonfunctional biliary drain. EXAM: IMAGE GUIDED RIGHT UPPER EXTREMITY PICC IMAGE GUIDED EXCHANGE INTERNAL EXTERNAL BILIARY  DRAIN MEDICATIONS: None ANESTHESIA/SEDATION: Moderate (conscious) sedation was employed during this procedure. A total of Versed 2.0 mg and  Fentanyl 100 mcg was administered intravenously. Moderate Sedation Time: 20 minutes. The patient's level of consciousness and vital signs were monitored continuously by radiology nursing throughout the procedure under my direct supervision. FLUOROSCOPY TIME:  Fluoroscopy Time: 1 minutes 0 seconds (14.7 mGy). COMPLICATIONS: None PROCEDURE: Informed written consent was obtained from the patient after a thorough discussion of the procedural risks, benefits and alternatives. All questions were addressed. Maximal Sterile Barrier Technique was utilized including caps, mask, sterile gowns, sterile gloves, sterile drape, hand hygiene and skin antiseptic. A timeout was performed prior to the initiation of the procedure. Patient was position in the supine position on the fluoroscopy table with the right arm abducted 90 degrees. Ultrasound survey of the upper extremity was performed with images stored and sent to PACs. The right brachial vein was selected for access. Once the patient was prepped and draped in the usual sterile fashion, the skin and subcutaneous tissues were generously infiltrated with 1% lidocaine for local anesthesia. A micropuncture access kit was then used to access the targeted vein. Wire was passed centrally, confirmed to be within the venous system under fluoroscopy. A small stab incision was made with an 11 blade scalpel and the sheath was then placed over the wire. Estimated length of the catheter was then performed with the indwelling wire. Catheter was amputated at 39 cm length and placed with coaxial wire through the peel-away. Double lumen, power injectable PICC in the brachial vein. Tip confirmed at the cavoatrial junction, and the catheter is ready for use. Stat lock was placed. We then address the nonfunctional internal/external biliary drain. Initial images of the abdomen demonstrates that the drain has been withdrawn compared to the position on the prior placement Contrast injected through the  drain demonstrates some resistance. The catheter was ligated, 1% lidocaine was administered at the skin, and the retention suture was cut. A Bentson wire was passed, which met some resistance in the drain, likely debris. Wire was passed to the duodenum. Catheter was then withdrawn on the wire and a new 12 French 45 cm internal/external biliary drain was placed via the left approach. Contrast was injected confirming the location. Drain was sutured in position and attached to gravity drainage Patient tolerated the procedure well and remained hemodynamically stable throughout. No complications were encountered and no significant blood loss. IMPRESSION: Status post right upper extremity brachial vein PICC, double lumen, ready for use. Status post exchange of partially occluded internal/external biliary drain with a new 12 French drain placed to gravity. Signed, Dulcy Fanny. Dellia Nims, [facility] Radiology Electronically Signed   By: Corrie Mckusick D.O.   On: 07/27/2020 14:59   IR BILIARY DRAIN PLACEMENT WITH CHOLANGIOGRAM  Result Date: 07/08/2020 INDICATION: Jaundice, biliary obstruction, pancreas cancer, successful ERCP EXAM: ULTRASOUND FLUOROSCOPIC LEFT INTERNAL EXTERNAL 10 FRENCH BILIARY DRAIN MEDICATIONS: 2 g Mefoxin; The antibiotic was administered within an appropriate time frame prior to the initiation of the procedure. ANESTHESIA/SEDATION: Moderate (conscious) sedation was employed during this procedure. A total of Versed 2.0 mg and Fentanyl 150 mcg was administered intravenously. Moderate Sedation Time: 32 minutes. The patient's level of consciousness and vital signs were monitored continuously by radiology nursing throughout the procedure under my direct supervision. FLUOROSCOPY TIME:  Fluoroscopy Time: 5 minutes 12 seconds (89 mGy). COMPLICATIONS: None immediate. PROCEDURE: Informed written consent was obtained from the patient after a thorough  discussion  of the procedural risks, benefits and alternatives. All questions were addressed. Maximal Sterile Barrier Technique was utilized including caps, mask, sterile gowns, sterile gloves, sterile drape, hand hygiene and skin antiseptic. A timeout was performed prior to the initiation of the procedure. Previous imaging reviewed. Preliminary ultrasound performed of the liver. Left hepatic ducts were localized from a subxiphoid approach. Overlying skin marked. Under sterile conditions and local anesthesia, a 21 gauge 15 cm access was advanced percutaneously under ultrasound into a peripheral left hepatic duct. Needle position confirmed with ultrasound and return of clear bile. Ultrasound images obtained for documentation. Contrast injection performed for initial cholangiogram. This demonstrates diffuse biliary dilatation and distal obstruction. 018 guidewire advanced into the biliary tree followed by the transitional dilator set. Amplatz guidewire advanced followed by a Kumpe catheter. Kumpe catheter advanced into the duodenum. Contrast injection confirms position in the duodenum. Over an Amplatz guidewire, tract dilatation performed to insert a 10 [place] internal external biliary drain. Retention loop positioned in the duodenum. Contrast injection confirms position. Images obtained for documentation. Cholangiogram confirms biliary obstruction. Cystic duct is patent with numerous gallstones noted. IMPRESSION: Successful 10 French left internal external biliary drain for distal CBD obstruction. Electronically Signed   By: Jerilynn Mages.  Shick M.D.   On: 07/08/2020 12:19   IR EXCHANGE BILIARY DRAIN  Result Date: 07/27/2020 INDICATION: 80 year old female with pancreatic carcinoma, prior internal external biliary drain now with suspected occlusion. She presents for IV access for possible chemotherapy and for exchange of the nonfunctional biliary drain. EXAM: IMAGE GUIDED RIGHT UPPER EXTREMITY PICC IMAGE GUIDED EXCHANGE INTERNAL EXTERNAL  BILIARY DRAIN MEDICATIONS: None ANESTHESIA/SEDATION: Moderate (conscious) sedation was employed during this procedure. A total of Versed 2.0 mg and Fentanyl 100 mcg was administered intravenously. Moderate Sedation Time: 20 minutes. The patient's level of consciousness and vital signs were monitored continuously by radiology nursing throughout the procedure under my direct supervision. FLUOROSCOPY TIME:  Fluoroscopy Time: 1 minutes 0 seconds (14.7 mGy). COMPLICATIONS: None PROCEDURE: Informed written consent was obtained from the patient after a thorough discussion of the procedural risks, benefits and alternatives. All questions were addressed. Maximal Sterile Barrier Technique was utilized including caps, mask, sterile gowns, sterile gloves, sterile drape, hand hygiene and skin antiseptic. A timeout was performed prior to the initiation of the procedure. Patient was position in the supine position on the fluoroscopy table with the right arm abducted 90 degrees. Ultrasound survey of the upper extremity was performed with images stored and sent to PACs. The right brachial vein was selected for access. Once the patient was prepped and draped in the usual sterile fashion, the skin and subcutaneous tissues were generously infiltrated with 1% lidocaine for local anesthesia. A micropuncture access kit was then used to access the targeted vein. Wire was passed centrally, confirmed to be within the venous system under fluoroscopy. A small stab incision was made with an 11 blade scalpel and the sheath was then placed over the wire. Estimated length of the catheter was then performed with the indwelling wire. Catheter was amputated at 39 cm length and placed with coaxial wire through the peel-away. Double lumen, power injectable PICC in the brachial vein. Tip confirmed at the cavoatrial junction, and the catheter is ready for use. Stat lock was placed. We then address the nonfunctional internal/external biliary drain.  Initial images of the abdomen demonstrates that the drain has been withdrawn compared to the position on the prior placement Contrast injected through the drain demonstrates some resistance. The catheter was ligated,  1% lidocaine was administered at the skin, and the retention suture was cut. A Bentson wire was passed, which met some resistance in the drain, likely debris. Wire was passed to the duodenum. Catheter was then withdrawn on the wire and a new 12 French 45 cm internal/external biliary drain was placed via the left approach. Contrast was injected confirming the location. Drain was sutured in position and attached to gravity drainage Patient tolerated the procedure well and remained hemodynamically stable throughout. No complications were encountered and no significant blood loss. IMPRESSION: Status post right upper extremity brachial vein PICC, double lumen, ready for use. Status post exchange of partially occluded internal/external biliary drain with a new 12 French drain placed to gravity. Signed, Dulcy Fanny. Dellia Nims, [facility] Radiology Electronically Signed   By: Corrie Mckusick D.O.   On: 07/27/2020 14:59    Primary pancreatic cancer [facility]) # 80 year old female patient with locally advanced pancreatic cancer-currently admitted to hospital for hypotension/acute renal failure/obstructive jaundice  #Pancreatic adenocarcinoma locally advanced/invading duodenum; 1 cm peripancreatic lymphadenopathy; CT scan at [facility] 2022]-hepatic artery abutment; T4N1-stage III disease.  Concerning for progressive disease-given nausea vomiting/hematemesis [see below].  #Nausea vomiting hematemesis-likely secondary to locally advanced malignancy involving the duodenum.   #Biliary obstruction-secondary to pancreatic malignancy; s/p recent exchange of biliary drain [IR on 5/4]-monitor LFTs.  #Acute renal failure-likely prerenal-creatinine 1.6.    Recommendation:  # Agree with GI evaluation; IV Protonix.  Defer to GI regarding endoscopy regarding possible gastric outlet obstruction.  #With regards to pancreatic cancer-hold chemotherapy given acute issues.  After long discussion with the patient/daughter/son-in-law that clinical situation is significantly worsened just in the last few weeks to the point that it is unclear if patient will ever be able to get chemotherapy or tolerate even palliative chemotherapy.   #Discussed DNR/DNI CODE STATUS-along with Josh Borders with palliative care; no decisions made.  Family understands that patient is at high risk of decompensation given multiple organ dysfunction.   Thank you Dr.Patel for allowing me to participate in the care of your pleasant patient. Please do not hesitate to contact me with questions or concerns in the interim.  Discussed with Dr. Posey Pronto.  Discussed with [employer].  All questions were answered. The patient knows to call the clinic with any problems, questions or concerns.    Cammie Sickle, MD 07/29/2020 7:31 PM

## 2020-07-29 NOTE — Consult Note (Signed)
Consultation  Referring Provider:     Dr Tobie Poet Admit date 07/28/20 Consult date        07/29/20 Reason for Consultation:    hematemesis         HPI:   Tracy Bolton is a 80 y.o. female with history of pancreatic cancer (dx 2/22) admitted yesterday for nausea with bilious vomiting at the time of being seen in oncology clinic.  Had episode of bloody emesis today without abdominal pain. Note she has history of Elevated LFTs with hyperbilirubinemia secondary to obstructive jaundice from encroaching pancreatic adenocarcinoma status post exchange of biliary stent by IR on May 4th- originally placed last month due to biliary obstruction. Chemotherapy has been discussed with plans to start but recent symptoms have precluded this.  Had recent pancreatic protocol CT with the following: Impression:  1. Pancreatichead mass with invasion of the adjacent duodenum. Mild  stranding inflammation may be postprocedural or reflect mild superimposed  pancreatitis.  2. Vascular involvement as above.  3. Prominent porta hepatis and peripancreatic lymph nodes, concerning for  nodal involvement. No other evidence of metastatic disease.  4. Endometrium abnormally thickened for age. Correlate with abnormal  bleeding and consider GYN workup as indicated.   PET 07/14/20- Pancreatic mass with local adenopathy now with biliary drain in  place.  Signs of peripancreatic nodal enlargement with smaller nodes in the  gastrohepatic recess.  No definitive evidence of distant metastatic disease.  Areas of ill-defined nodularity in the chest most notably in the  RIGHT mid chest along the minor fissure, of uncertain significance.  The dominant area shows tubular morphology. Close attention on  follow-up is suggested, consider short interval follow-up for  further evaluation.  Large mediastinal/lower lobe neck mass may represent a large border  and is been commented on on multiple prior chest x-rays. No  comparison cross-sectional  imaging is available. This area shows  increased FDG uptake. Focused ultrasound and biopsy is suggested.   Palliative care has been consulted and met with patient/family today.  Patient reports she is currently comfortable. She denies any nausea and has not had any further vomiting since early this morning. She denies any abdominal pain. Denies any melena/acholic stools/rectal bleeding, dysphagia, further GI concerns. She has been started on bid IV pantoprazole. She does not use nsaids routinely. We reviewed her recent procedures/imaging and discussed her plan of care from a GI perspective. Her hemoglobin is stable at 11.2/11.7. her pt/inr is normal.   PREVIOUS ENDOSCOPIES:            Egd/ercp 07/01/20- [facility]/Dr Spaete- malignant appearing severe stenosis with mucosal ulceration and edema which precluded ercp- biopsies + for adenocarcinoma, suspected to represent extension of pancreatic ductal adenocarcinoma into the duodenum with colonization by cancer cells.  Colonoscopy 2011, 2006, 2005, 2004- I am unable to access these reports   Past Medical History:  Diagnosis Date  . AKI (acute kidney injury) ([place]) 07/28/2020  . Arthritis   . Gout, chronic   . Hyperlipidemia   . Hypertension   . Osteoporosis   . Thyroid disease     Past Surgical History:  Procedure Laterality Date  . CATARACT EXTRACTION, BILATERAL  02/2018  . COLONOSCOPY  2016  . IR BILIARY DRAIN PLACEMENT WITH CHOLANGIOGRAM  07/08/2020  . IR EXCHANGE BILIARY DRAIN  07/27/2020  . IR FLUORO GUIDE CV LINE LEFT  07/27/2020  . LUNG SURGERY Right 2004   biopsy normal    Family History  Problem Relation Age of Onset  .  Bone cancer Father 49       not biological father  . Thyroid disease Sister   . Cancer Mother 33       brain tumor  . Breast cancer Neg Hx     Social History   Tobacco Use  . Smoking status: Never Smoker  . Smokeless tobacco: Never Used  Vaping Use  . Vaping Use: Never used  Substance Use Topics  .  Alcohol use: No    Alcohol/week: 0.0 standard drinks  . Drug use: No    Prior to Admission medications   Medication Sig Start Date End Date Taking? Authorizing Provider  allopurinol (ZYLOPRIM) 100 MG tablet TAKE 1 TABLET BY MOUTH EVERY DAY 09/29/19  Yes Delsa Grana, PA-C  atorvastatin (LIPITOR) 10 MG tablet TAKE 1 TABLET BY MOUTH EVERYDAY AT BEDTIME 08/24/19  Yes Tapia, Leisa, PA-C  nystatin (MYCOSTATIN/NYSTOP) powder APPLY TO AFFECTED AREA OF SKIN 3X A DAY AS NEEDED IN A SUFFICIENT AMOUNT 01/27/20  Yes Delsa Grana, PA-C  ondansetron (ZOFRAN) 8 MG tablet Take 1 tablet (8 mg total) by mouth every 8 (eight) hours as needed for nausea or vomiting. 07/25/20  Yes Borders, Kirt Boys, NP  oxyCODONE (OXY IR/ROXICODONE) 5 MG immediate release tablet Take 1 tablet (5 mg total) by mouth every 6 (six) hours as needed for severe pain or breakthrough pain. 07/09/20  Yes Nita Sells, MD  pantoprazole (PROTONIX) 40 MG tablet Take 1 tablet (40 mg total) by mouth daily. 07/05/20  Yes Cammie Sickle, MD  amLODipine (NORVASC) 10 MG tablet TAKE 1 TABLET BY MOUTH EVERY DAY Patient not taking: No sig reported 09/29/19   Delsa Grana, PA-C  aspirin 81 MG tablet Take 81 mg by mouth. Patient not taking: No sig reported 03/13/06   [provider]  atenolol (TENORMIN) 50 MG tablet TAKE 1 TABLET BY MOUTH EVERY DAY Patient not taking: No sig reported 09/29/19   Delsa Grana, PA-C  blood glucose meter kit and supplies KIT Dispense based on patient and insurance preference. Use up to four times daily as directed. 07/15/20   Cammie Sickle, MD  lidocaine-prilocaine (EMLA) cream Apply 30 -45 mins prior to port access. Patient not taking: No sig reported 07/05/20   Cammie Sickle, MD  metFORMIN (GLUCOPHAGE-XR) 500 MG 24 hr tablet TAKE 2 TABLETS BY MOUTH EVERY DAY Patient not taking: No sig reported 09/29/19   Delsa Grana, PA-C  prochlorperazine (COMPAZINE) 10 MG tablet Take 1 tablet (10 mg total) by  mouth every 6 (six) hours as needed for nausea or vomiting. Patient not taking: Reported on 07/28/2020 07/05/20   Cammie Sickle, MD  senna (SENOKOT) 8.6 MG TABS tablet Take 1 tablet (8.6 mg total) by mouth at bedtime as needed for mild constipation. Patient not taking: No sig reported 07/09/20   Nita Sells, MD    Current Facility-Administered Medications  Medication Dose Route Frequency Provider Last Rate Last Admin  . acetaminophen (TYLENOL) tablet 650 mg  650 mg Oral Q6H PRN Cox, Amy N, DO       Or  . acetaminophen (TYLENOL) suppository 650 mg  650 mg Rectal Q6H PRN Cox, Amy N, DO      . allopurinol (ZYLOPRIM) tablet 100 mg  100 mg Oral Daily Cox, Amy N, DO   100 mg at 07/28/20 1443  . Chlorhexidine Gluconate Cloth 2 % PADS 6 each  6 each Topical Daily Fritzi Mandes, MD   6 each at 07/29/20 1328  . morphine 2  MG/ML injection 1 mg  1 mg Intravenous Q4H PRN Cox, Amy N, DO      . ondansetron (ZOFRAN) tablet 4 mg  4 mg Oral Q6H PRN Cox, Amy N, DO       Or  . ondansetron (ZOFRAN) injection 4 mg  4 mg Intravenous Q6H PRN Cox, Amy N, DO   4 mg at 07/28/20 1747  . oxyCODONE (Oxy IR/ROXICODONE) immediate release tablet 5 mg  5 mg Oral Q6H PRN Cox, Amy N, DO      . pantoprazole (PROTONIX) injection 40 mg  40 mg Intravenous Q12H Sharion Settler, NP   40 mg at 07/29/20 1040  . prochlorperazine (COMPAZINE) injection 10 mg  10 mg Intravenous Q6H PRN Sharion Settler, NP   10 mg at 07/28/20 2343   Facility-Administered Medications Ordered in Other Encounters  Medication Dose Route Frequency Provider Last Rate Last Admin  . 0.9 %  sodium chloride infusion   Intravenous Continuous Theresa Duty, NP        Allergies as of 07/28/2020  . (No Known Allergies)     Review of Systems:    All systems reviewed and negative except where noted in HPI, with the exception of fatigue, weight loss.      Physical Exam:  Vital signs in last 24 hours: Temp:  [97.6 F (36.4 C)-98 F (36.7  C)] 98 F (36.7 C) (05/06 0759) Pulse Rate:  [69-94] 84 (05/06 0759) Resp:  [16-20] 16 (05/06 0759) BP: (98-118)/(64-74) 98/71 (05/06 0759) SpO2:  [98 %-100 %] 99 % (05/06 0759) Last BM Date: 07/26/20 General:   Pleasant woman in NAD Head:  Normocephalic and atraumatic. Eyes:   Scleral icterus.   Conjunctiva pink. Ears:  Normal auditory acuity. Mouth: Mucosa pink moist, no lesions. Neck:  Supple; no masses felt Lungs:  Respirations even and unlabored. Lungs clear to auscultation bilaterally.   No wheezes, crackles, or rhonchi.  Heart:  S1S2, RRR, no MRG. No edema. Abdomen:  Drain present to ruq, draining bilous fluid. Flat, soft, nondistended, nontender. Normal bowel sounds. No appreciable masses or hepatomegaly. No rebound signs or other peritoneal signs. Rectal:  Not performed.  Msk:  MAEW x4, No clubbing or cyanosis. Strength 5/5. Symmetrical without gross deformities. Neurologic:  Alert and  oriented x4;  Cranial nerves II-XII intact.  Skin:  Warm, dry, pink without significant lesions or rashes. Psych:  Alert and cooperative. Normal affect.  LAB RESULTS: Recent Labs    07/28/20 0850 07/29/20 0124 07/29/20 0404  WBC 11.3*  --  11.2*  HGB 13.3 11.7* 11.2*  HCT 41.4 36.4 34.3*  PLT 260  --  166   BMET Recent Labs    07/28/20 0850 07/29/20 0404  NA 144 146*  K 3.9 3.5  CL 105 110  CO2 19* 21*  GLUCOSE 188* 129*  BUN 41* 41*  CREATININE 2.13* 1.65*  CALCIUM 10.2 9.4   LFT Recent Labs    07/28/20 1000  PROT 8.6*  ALBUMIN 3.7  AST 239*  ALT 145*  ALKPHOS 121  BILITOT 6.8*  BILIDIR 3.2*  IBILI 3.6*   PT/INR Recent Labs    07/28/20 1000  LABPROT 14.8  INR 1.2    STUDIES: CT ABDOMEN PELVIS WO CONTRAST  Result Date: 07/28/2020 CLINICAL DATA:  Abdominal distension, lethargic and hypotensive. History of pancreatic cancer in this 80 year old female with transit padded a percutaneous biliary drainage catheter in place. EXAM: CT ABDOMEN AND PELVIS WITHOUT  CONTRAST TECHNIQUE: Multidetector CT imaging of the  abdomen and pelvis was performed following the standard protocol without IV contrast. COMPARISON:  May 23, 2020 and PET imaging as well as MR imaging since that time most recent cross-sectional comparison from July 14, 2020. FINDINGS: Lower chest: Nodular area along the major fissure in the RIGHT chest stable when compared to recent PET-CT 10 x 7 mm. No effusion. No consolidation. Hepatobiliary: Liver with smooth contours. Percutaneous biliary drainage catheter in place, tip in the duodenum traversing LEFT hepatic lobe with side-hole marker well within the hepatic substance. Side-hole marker 5 cm into the liver. No perihepatic stranding at the site of entry. Contrast mixed with numerous stones in the gallbladder. Gallbladder is distended without signs of pericholecystic stranding or fluid. No focal hepatic abnormality. Pancreas: Pancreas with stable appearance compared to recent PET imaging aside from potential mild peripancreatic stranding. Ill-defined mass in the pancreatic head not grossly changed. Spleen: Normal spleen. Adrenals/Urinary Tract: Normal appearance of adrenal glands. No perinephric stranding. No hydronephrosis. No perivesical stranding. No nephrolithiasis. Stomach/Bowel: No perianal stranding. Colonic diverticulosis without signs of diverticulitis. Normal appendix. Small hiatal hernia. No perigastric stranding. Small bowel is normal in caliber. No adjacent stranding. Vascular/Lymphatic: Calcified atheromatous plaque of the abdominal aorta. Small lymph nodes adjacent to pancreas not well evaluated. No new retroperitoneal adenopathy. Insert no pelvic adenopathy. Reproductive: No adnexal masses. Other: No free air. No ascites. Small fat containing umbilical hernia. Musculoskeletal: Spinal degenerative changes. No acute or destructive bone process. Degenerative changes of the bilateral hips with severe degenerative change. IMPRESSION: 1. Query  mild peripancreatic stranding. Correlate with pancreatic enzymes. 2. Percutaneous biliary drainage catheter in place, tip in the duodenum traversing LEFT hepatic lobe with side-hole marker well within the hepatic substance. w no acute hepatic findings on noncontrast imaging. 3. Cholelithiasis with contrast in a distended gallbladder. No adjacent stranding or fluid. 4. Ill-defined mass in the pancreatic head not grossly changed. 5. Colonic diverticulosis without signs of diverticulitis. 6. Nodular area along the major fissure in the RIGHT chest stable when compared to recent PET-CT 10 x 7 mm. Attention on follow-up. 7. Aortic atherosclerosis. Aortic Atherosclerosis (ICD10-I70.0). Electronically Signed   By: Zetta Bills M.D.   On: 07/28/2020 11:56   CT Head Wo Contrast  Result Date: 07/28/2020 CLINICAL DATA:  Migraine-type headache.  Lethargy.  Hypotension. EXAM: CT HEAD WITHOUT CONTRAST TECHNIQUE: Contiguous axial images were obtained from the base of the skull through the vertex without intravenous contrast. COMPARISON:  08/21/2013. FINDINGS: Brain: No evidence of acute infarction, hemorrhage, hydrocephalus, extra-axial collection or mass lesion/mass effect. Vascular: No hyperdense vessel or unexpected calcification. Skull: Normal. Negative for fracture or focal lesion. Sinuses/Orbits: Visualized globes and orbits are unremarkable. Visualized sinuses are clear. Other: None. IMPRESSION: Negative unenhanced CT scan of the brain for age. Electronically Signed   By: Lajean Manes M.D.   On: 07/28/2020 11:36       Impression / Plan:   1 Hematemesis- agree with PPI bid. hgb stable. Would follow hgb and transfuse as clinically indicated. Her ulcerated mass/stenosis in the duodenum is likely the source. Can consider carafate 1 gm tid-qid. As there is no current bleeding and hgn stable will hold EGD as it is of doubtful benefit in this instance- there was note of consideration of duodenal stenting to be  considered at [facility] for the stenosis, this could be helpful in future for comfort/nutrition. Agree with palliative care consult  Thank you very much for this consult. These services were provided by Stephens November, NP-C, in collaboration with  Andrey Farmer, MD, with whom I have discussed this patient in full.   Stephens November, NP-C

## 2020-07-29 NOTE — Progress Notes (Addendum)
Initial Nutrition Assessment  DOCUMENTATION CODES:   Obesity unspecified  INTERVENTION:   RD will monitor for diet advancement vs the need for nutrition support  Pt at high refeed risk; recommend monitor potassium, magnesium and phosphorus labs daily until stable  NUTRITION DIAGNOSIS:   Inadequate oral intake related to acute illness as evidenced by per patient/family report.  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  RD working remotely.  Unable to reach patient by phone. Per chart review, pt with decreased appetite and oral intake for the past several weeks ever since her drain placement on 4/15 but has had poor oral intake for the past 2 days r/t nausea and vomiting. Per chart, pt is down 70lbs(28%) over the past year; this is significant weight loss. RD suspects pt with poor oral intake for several months. Pt continues to have coffee ground emesis today. Per MD, plan is for barium swallow as there are concerns for duodenal obstruction. Pt with PICC line in place. Drain with 56ml output. Spoke with MD, palliative care consult is pending. Depending on patient's wishes for [place], would recommend TPN if patient is unable to be initiated on a diet. Pt is at high refeed risk. RD will monitor for diet advancement vs the need for nutrition support vs GOC. RD will follow up to obtain nutrition related exam. Pt is at high risk for malnutrition.   Medications reviewed and include: allopurinol, protonix, LRS @125ml /hr  Labs reviewed: Na 146(H),  BUN 41(H), creat 1.65(H) Mg 2.3 wnl, lipase 76(H), AST 239(H), ALT 145(H), ammonia 18, tbili 6.8(H) Wbc- 11.2(H)  NUTRITION - FOCUSED PHYSICAL EXAM: Unable to perform at this time   Diet Order:   Diet Order            Diet NPO time specified  Diet effective midnight                EDUCATION NEEDS:   Not appropriate for education at this time  Skin:  Skin Assessment: Reviewed RN Assessment  Last BM:  5/3  Height:   Ht Readings from Last 1 Encounters:  07/28/20 5\' 2"  (1.575 m)    Weight:   Wt Readings from Last 1 Encounters:  07/28/20 80 kg    Ideal Body Weight:  50 kg  BMI:  Body mass index is 32.26 kg/m.  Estimated Nutritional Needs:   Kcal:  1900-2200kcal/day  Protein:  100-110g/day  Fluid:  1.5-1.7L/day  Koleen Distance MS, RD, LDN Please refer to [facility] for RD and/or RD on-call/weekend/after hours pager

## 2020-07-29 NOTE — Consult Note (Addendum)
[place]  Telephone:([phone] [phone]   Name: Tracy Bolton Date: 07/29/2020 MRN: [id number]  DOB: June 10, 1940  Patient Care Team: Delsa Grana, PA-C as PCP - General (Family Medicine) Loney Loh, Michaell Cowing, MD as Referring Physician (Endocrinology) Olean Ree, MD as Consulting Physician (General Surgery) Clent Jacks, RN as Oncology Nurse Navigator    REASON FOR CONSULTATION: Tracy Bolton is a 80 y.o. 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  Patient has not felt to be a surgical candidate as the cancer abuts the hepatic artery.  She has had obstructive jaundice.    Patient was started on systemic chemo with Abraxane/gemcitabine.  She was admitted to hospital 07/28/2020 with hematemesis.  Patient noted to have hepatic and renal dysfunction.  She is referred to palliative care to help address goals and manage ongoing symptoms..   SOCIAL HISTORY:     reports that she has never smoked. She has never used smokeless tobacco. She reports that she does not drink alcohol and does not use drugs.  Patient is divorced.  She lives at home and her daughters rotate caregiving.  ADVANCE DIRECTIVES:  Does not have  CODE STATUS: Full code  PAST MEDICAL HISTORY: Past Medical History:  Diagnosis Date  . AKI (acute kidney injury) ([place]) 07/28/2020  . Arthritis   . Gout, chronic   . Hyperlipidemia   . Hypertension   . Osteoporosis   . Thyroid disease     PAST SURGICAL HISTORY:  Past Surgical History:  Procedure Laterality Date  . CATARACT EXTRACTION, BILATERAL  02/2018  . COLONOSCOPY  2016  . IR BILIARY DRAIN PLACEMENT WITH CHOLANGIOGRAM  07/08/2020  . IR EXCHANGE BILIARY DRAIN  07/27/2020  . IR FLUORO GUIDE CV LINE LEFT  07/27/2020  . LUNG SURGERY Right 2004   biopsy normal    HEMATOLOGY/ONCOLOGY HISTORY:  Oncology History Overview  Note  # FEB 28th 2022-  Ill-defined hypodense area seen within the pancreatic head which appears to abut the duodenum and adjacent to coarse calcifications. 2. Duodenal diverticula with focal wall thickening/narrowing which; MRI Abdomen- 3.5 cm ill-defined hypovascular mass in the pancreatic head, highly suspicious for pancreatic adenocarcinoma. This causes mild diffuse biliary and pancreatic ductal dilatation. 1 cm peripancreatic lymph node, suspicious for metastatic disease.  # EUS/ERCP-attempted at [facility] 8th]; Dr.Spaete-unable to traverse duodenal stricture; duodenal biopsy positive adenocarcinoma.   could be due to mild duodenitis. 5. 8 mm pulmonary nodule within the right lung base. N # Goiter [Dr.Clemmons; [facility]: now with [facility]]   Impression:  1. Pancreatic head mass with invasion of the adjacent duodenum. Mild  stranding inflammation may be postprocedural or reflect mild superimposed  pancreatitis.  2. Vascular involvement as above.  3. Prominent porta hepatis and peripancreatic lymph nodes, concerning for  nodal involvement. No other evidence of metastatic disease.  4. Endometrium abnormally thickened for age. Correlate with abnormal  bleeding and consider GYN workup as indicated.    #April 2022-pT4pN1- stage III pancreatic adenocarcinoma [s/p Eval at [facility], [place]   Primary pancreatic cancer ([place])  07/05/2020 Initial Diagnosis   Primary pancreatic cancer ([place])   07/22/2020 -  Chemotherapy    Patient is on Treatment Plan: PANCREATIC ABRAXANE / GEMCITABINE D1,8,15 Q28D        ALLERGIES:  has No Known Allergies.  MEDICATIONS:  Current Facility-Administered Medications  Medication Dose Route Frequency Provider Last Rate Last Admin  . acetaminophen (  TYLENOL) tablet 650 mg  650 mg Oral Q6H PRN Cox, Amy N, DO       Or  . acetaminophen (TYLENOL) suppository 650 mg  650 mg Rectal Q6H PRN Cox, Amy N, DO      . allopurinol (ZYLOPRIM) tablet 100 mg  100 mg Oral Daily Cox,  Amy N, DO   100 mg at 07/28/20 1443  . Chlorhexidine Gluconate Cloth 2 % PADS 6 each  6 each Topical Daily Fritzi Mandes, MD      . lactated ringers infusion   Intravenous Continuous Cox, Amy N, DO 125 mL/hr at 07/29/20 0725 New Bag at 07/29/20 0725  . morphine 2 MG/ML injection 1 mg  1 mg Intravenous Q4H PRN Cox, Amy N, DO      . ondansetron (ZOFRAN) tablet 4 mg  4 mg Oral Q6H PRN Cox, Amy N, DO       Or  . ondansetron (ZOFRAN) injection 4 mg  4 mg Intravenous Q6H PRN Cox, Amy N, DO   4 mg at 07/28/20 1747  . oxyCODONE (Oxy IR/ROXICODONE) immediate release tablet 5 mg  5 mg Oral Q6H PRN Cox, Amy N, DO      . pantoprazole (PROTONIX) injection 40 mg  40 mg Intravenous Q12H Sharion Settler, NP   40 mg at 07/29/20 1040  . prochlorperazine (COMPAZINE) injection 10 mg  10 mg Intravenous Q6H PRN Sharion Settler, NP   10 mg at 07/28/20 2343   Facility-Administered Medications Ordered in Other Encounters  Medication Dose Route Frequency Provider Last Rate Last Admin  . 0.9 %  sodium chloride infusion   Intravenous Continuous Soyla Dryer R, NP        VITAL SIGNS: BP 98/71 (BP Location: Left Arm)   Pulse 84   Temp 98 F (36.7 C) (Oral)   Resp 16   Ht _0  (1.575 m)   Wt 176 lb 5.9 oz (80 kg)   SpO2 99%   BMI 32.26 kg/m  Filed Weights   07/28/20 0935  Weight: 176 lb 5.9 oz (80 kg)    Estimated body mass index is 32.26 kg/m as calculated from the following:   Height as of this encounter: _1  (1.575 m).   Weight as of this encounter: 176 lb 5.9 oz (80 kg).  LABS: CBC:    Component Value Date/Time   WBC 11.2 (H) 07/29/2020 0404   HGB 11.2 (L) 07/29/2020 0404   HGB 13.7 05/19/2015 0951   HCT 34.3 (L) 07/29/2020 0404   HCT 42.7 05/19/2015 0951   PLT 166 07/29/2020 0404   PLT 233 05/19/2015 0951   MCV 84.7 07/29/2020 0404   MCV 83 05/19/2015 0951   MCV 86 08/31/2013 1300   NEUTROABS 8.0 (H) 07/28/2020 0850   NEUTROABS 4.6 05/19/2015 0951   NEUTROABS 6.9 (H) 08/31/2013 1300    LYMPHSABS 2.3 07/28/2020 0850   LYMPHSABS 3.9 (H) 05/19/2015 0951   LYMPHSABS 2.5 08/31/2013 1300   MONOABS 1.0 07/28/2020 0850   MONOABS 0.8 08/31/2013 1300   EOSABS 0.0 07/28/2020 0850   EOSABS 0.2 05/19/2015 0951   EOSABS 0.2 08/31/2013 1300   BASOSABS 0.1 07/28/2020 0850   BASOSABS 0.0 05/19/2015 0951   BASOSABS 0.1 08/31/2013 1300   Comprehensive Metabolic Panel:    Component Value Date/Time   NA 146 (H) 07/29/2020 0404   NA 141 05/19/2015 0951   NA 142 09/15/2013 1906   K 3.5 07/29/2020 0404   K 3.7 09/15/2013 1906   CL 110  07/29/2020 0404   CL 108 (H) 09/15/2013 1906   CO2 21 (L) 07/29/2020 0404   CO2 23 09/15/2013 1906   BUN 41 (H) 07/29/2020 0404   BUN 15 05/19/2015 0951   BUN 10 09/15/2013 1906   CREATININE 1.65 (H) 07/29/2020 0404   CREATININE 0.96 (H) 04/08/2020 1139   GLUCOSE 129 (H) 07/29/2020 0404   GLUCOSE 123 (H) 09/15/2013 1906   CALCIUM 9.4 07/29/2020 0404   CALCIUM 8.9 09/15/2013 1906   AST 239 (H) 07/28/2020 1000   AST 22 09/15/2013 1906   ALT 145 (H) 07/28/2020 1000   ALT 20 09/15/2013 1906   ALKPHOS 121 07/28/2020 1000   ALKPHOS 82 09/15/2013 1906   BILITOT 6.8 (H) 07/28/2020 1000   BILITOT 0.7 05/19/2015 0951   BILITOT 0.5 09/15/2013 1906   PROT 8.6 (H) 07/28/2020 1000   PROT 7.6 05/19/2015 0951   PROT 6.3 (L) 09/15/2013 1906   ALBUMIN 3.7 07/28/2020 1000   ALBUMIN 4.3 05/19/2015 0951   ALBUMIN 3.0 (L) 09/15/2013 1906    RADIOGRAPHIC STUDIES: CT ABDOMEN PELVIS WO CONTRAST  Result Date: 07/28/2020 CLINICAL DATA:  Abdominal distension, lethargic and hypotensive. History of pancreatic cancer in this 80 year old female with transit padded a percutaneous biliary drainage catheter in place. EXAM: CT ABDOMEN AND PELVIS WITHOUT CONTRAST TECHNIQUE: Multidetector CT imaging of the abdomen and pelvis was performed following the standard protocol without IV contrast. COMPARISON:  May 23, 2020 and PET imaging as well as MR imaging since that time  most recent cross-sectional comparison from July 14, 2020. FINDINGS: Lower chest: Nodular area along the major fissure in the RIGHT chest stable when compared to recent PET-CT 10 x 7 mm. No effusion. No consolidation. Hepatobiliary: Liver with smooth contours. Percutaneous biliary drainage catheter in place, tip in the duodenum traversing LEFT hepatic lobe with side-hole marker well within the hepatic substance. Side-hole marker 5 cm into the liver. No perihepatic stranding at the site of entry. Contrast mixed with numerous stones in the gallbladder. Gallbladder is distended without signs of pericholecystic stranding or fluid. No focal hepatic abnormality. Pancreas: Pancreas with stable appearance compared to recent PET imaging aside from potential mild peripancreatic stranding. Ill-defined mass in the pancreatic head not grossly changed. Spleen: Normal spleen. Adrenals/Urinary Tract: Normal appearance of adrenal glands. No perinephric stranding. No hydronephrosis. No perivesical stranding. No nephrolithiasis. Stomach/Bowel: No perianal stranding. Colonic diverticulosis without signs of diverticulitis. Normal appendix. Small hiatal hernia. No perigastric stranding. Small bowel is normal in caliber. No adjacent stranding. Vascular/Lymphatic: Calcified atheromatous plaque of the abdominal aorta. Small lymph nodes adjacent to pancreas not well evaluated. No new retroperitoneal adenopathy. Insert no pelvic adenopathy. Reproductive: No adnexal masses. Other: No free air. No ascites. Small fat containing umbilical hernia. Musculoskeletal: Spinal degenerative changes. No acute or destructive bone process. Degenerative changes of the bilateral hips with severe degenerative change. IMPRESSION: 1. Query mild peripancreatic stranding. Correlate with pancreatic enzymes. 2. Percutaneous biliary drainage catheter in place, tip in the duodenum traversing LEFT hepatic lobe with side-hole marker well within the hepatic substance. w  no acute hepatic findings on noncontrast imaging. 3. Cholelithiasis with contrast in a distended gallbladder. No adjacent stranding or fluid. 4. Ill-defined mass in the pancreatic head not grossly changed. 5. Colonic diverticulosis without signs of diverticulitis. 6. Nodular area along the major fissure in the RIGHT chest stable when compared to recent PET-CT 10 x 7 mm. Attention on follow-up. 7. Aortic atherosclerosis. Aortic Atherosclerosis (ICD10-I70.0). Electronically Signed   By: Zetta Bills  M.D.   On: 07/28/2020 11:56   CT Head Wo Contrast  Result Date: 07/28/2020 CLINICAL DATA:  Migraine-type headache.  Lethargy.  Hypotension. EXAM: CT HEAD WITHOUT CONTRAST TECHNIQUE: Contiguous axial images were obtained from the base of the skull through the vertex without intravenous contrast. COMPARISON:  08/21/2013. FINDINGS: Brain: No evidence of acute infarction, hemorrhage, hydrocephalus, extra-axial collection or mass lesion/mass effect. Vascular: No hyperdense vessel or unexpected calcification. Skull: Normal. Negative for fracture or focal lesion. Sinuses/Orbits: Visualized globes and orbits are unremarkable. Visualized sinuses are clear. Other: None. IMPRESSION: Negative unenhanced CT scan of the brain for age. Electronically Signed   By: Lajean Manes M.D.   On: 07/28/2020 11:36   [place] Intraoperative  Result Date: 07/08/2020 INDICATION: Jaundice, biliary obstruction, pancreas cancer, successful ERCP EXAM: ULTRASOUND FLUOROSCOPIC LEFT INTERNAL EXTERNAL 10 FRENCH BILIARY DRAIN MEDICATIONS: 2 g Mefoxin; The antibiotic was administered within an appropriate time frame prior to the initiation of the procedure. ANESTHESIA/SEDATION: Moderate (conscious) sedation was employed during this procedure. A total of Versed 2.0 mg and Fentanyl 150 mcg was administered intravenously. Moderate Sedation Time: 32 minutes. The patient's level of consciousness and vital signs were monitored continuously by radiology nursing  throughout the procedure under my direct supervision. FLUOROSCOPY TIME:  Fluoroscopy Time: 5 minutes 12 seconds (89 mGy). COMPLICATIONS: None immediate. PROCEDURE: Informed written consent was obtained from the patient after a thorough discussion of the procedural risks, benefits and alternatives. All questions were addressed. Maximal Sterile Barrier Technique was utilized including caps, mask, sterile gowns, sterile gloves, sterile drape, hand hygiene and skin antiseptic. A timeout was performed prior to the initiation of the procedure. Previous imaging reviewed. Preliminary ultrasound performed of the liver. Left hepatic ducts were localized from a subxiphoid approach. Overlying skin marked. Under sterile conditions and local anesthesia, a 21 gauge 15 cm access was advanced percutaneously under ultrasound into a peripheral left hepatic duct. Needle position confirmed with ultrasound and return of clear bile. Ultrasound images obtained for documentation. Contrast injection performed for initial cholangiogram. This demonstrates diffuse biliary dilatation and distal obstruction. 018 guidewire advanced into the biliary tree followed by the transitional dilator set. Amplatz guidewire advanced followed by a Kumpe catheter. Kumpe catheter advanced into the duodenum. Contrast injection confirms position in the duodenum. Over an Amplatz guidewire, tract dilatation performed to insert a 10 [place] internal external biliary drain. Retention loop positioned in the duodenum. Contrast injection confirms position. Images obtained for documentation. Cholangiogram confirms biliary obstruction. Cystic duct is patent with numerous gallstones noted. IMPRESSION: Successful 10 French left internal external biliary drain for distal CBD obstruction. Electronically Signed   By: Jerilynn Mages.  Shick M.D.   On: 07/08/2020 12:19   NM PET Image Initial (PI) Skull Base To Thigh  Addendum Date: 07/14/2020   ADDENDUM REPORT: 07/14/2020 16:13 ADDENDUM:  Note that there is some mild to moderate airway narrowing at the thoracic inlet. The patient has a chronic mass in this area. Would still however correlate with any respiratory symptoms given the marked displacement of the trachea. For reference the trachea in the upper chest is 15 mm and at the thoracic inlet is 9 mm greatest width. Note also that nodular areas in the chest are without increased metabolic activity. These results will be called to the ordering clinician or representative by the Radiologist Assistant, and communication documented in the PACS or [employer]. Electronically Signed   By: Zetta Bills M.D.   On: 07/14/2020 16:13   Result Date: 07/14/2020 CLINICAL DATA:  Initial  treatment strategy for pancreatic cancer. EXAM: NUCLEAR MEDICINE PET SKULL BASE TO THIGH TECHNIQUE: 10.2 mCi F-18 FDG was injected intravenously. Full-ring PET imaging was performed from the skull base to thigh after the radiotracer. CT data was obtained and used for attenuation correction and anatomic localization. Fasting blood glucose: CT of the abdomen and pelvis from May 23, 2020 mg/dl COMPARISON:  Abdominal CT from May 23, 2020 FINDINGS: Mediastinal blood pool activity: SUV max 3.7 Liver activity: SUV max NA NECK: Large RIGHT neck mass 9.6 x 7.8 cm with areas of increased FDG uptake with maximum SUV of 5.9. There is tracheal deviation that is been described on multiple prior chest x-rays dating back to 2006. No discrete adenopathy is present at the thoracic inlet or in the neck. Normal anatomy is highly distorted and the airway is narrowed secondary to mass effect from this thoracic inlet/neck mass that extends from the submandibular region into the upper chest. Incidental CT findings: none CHEST: No hypermetabolic mediastinal or hilar nodes. Scattered pulmonary nodules, described below show no increased metabolic activity above blood pool largest in the RIGHT chest with a maximum SUV of 1.2 Incidental CT  findings: Calcified atheromatous plaque of the thoracic aorta. Nonaneurysmal. Mildly engorged central pulmonary vasculature 3.2 cm. Mitral annular calcifications. Normal heart size. No pericardial effusion. No adenopathy by size criteria in the chest low-density area measuring water density in the mediastinum may represent a small pericardial cyst seen on image 62 and 63 of series 3 and shows no increased FDG uptake. Basilar atelectasis. Areas of nodularity are present, 1 area along the major fissure in the RIGHT chest measures 10 x 7 mm. Apical pleural scarring. Ill-defined nodule in the LEFT upper lobe measuring 4 mm. Another in the LEFT upper lobe measuring 4 mm on images 53 and 57 respectively. Pulmonary emphysema. ABDOMEN/PELVIS: Large hypermetabolic mass in the pancreatic-duodenal groove with a maximum SUV of 30 corresponding to the abnormality seen on prior images, difficult to separate from duodenum on the current exam not grossly changed compared to the recent MRI evaluation Heterogeneous hepatic activity without discrete lesion. Increased metabolic uptake along the patient's biliary drainage catheter passing through the LEFT hepatic lobe. Lymph node adjacent to the pancreatic head measuring approximately 12 mm with increased metabolic activity (image 161, series 3. Metabolic activity is difficult to separate from the adjacent pancreatic mass maximum SUV estimated to be in the range of 6.5. Hepatic gastric/celiac nodes, a cluster of lymph nodes seen on image 114 of series 3 also with some dilated vascular channels in this area metabolic activity at approximately 3.5 maximum SUV, largest node in this area approximately 6 mm. No retroperitoneal adenopathy below the renal veins. Diffuse bowel activity Incidental CT findings: Cholelithiasis. Numerous gallstones in the gallbladder. Spleen, adrenal glands and kidneys without acute process. No perinephric stranding. Urinary bladder collapsed. No acute  gastrointestinal process. Normal appendix. Uterus and adnexa unremarkable by CT. Small fat containing umbilical hernia. SKELETON: No focal hypermetabolic activity to suggest skeletal metastasis. 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 Spinal degenerative changes. IMPRESSION: Pancreatic mass with local adenopathy now with biliary drain in place. Signs of peripancreatic nodal enlargement with smaller nodes in the gastrohepatic recess. No definitive evidence of distant metastatic disease. Areas of ill-defined nodularity in the chest most notably in the RIGHT mid chest along the minor fissure, of uncertain significance. The dominant area shows tubular morphology. Close attention on follow-up is suggested, consider short interval follow-up for further evaluation. Large mediastinal/lower lobe  neck mass may represent a large border and is been commented on on multiple prior chest x-rays. No comparison cross-sectional imaging is available. This area shows increased FDG uptake. Focused ultrasound and biopsy is suggested. Recommend thyroid US and biopsy yes sarcasm just cleanup this 1 finger and there is(ref: J Am Coll Radiol. 2015 Feb;12(2): 143-50). Electronically Signed: By: Zetta Bills M.D. On: 07/14/2020 15:53   IR Fluoro Guide CV Line Left  Result Date: 07/27/2020 INDICATION: 80 year old female with pancreatic carcinoma, prior internal external biliary drain now with suspected occlusion. She presents for IV access for possible chemotherapy and for exchange of the nonfunctional biliary drain. EXAM: IMAGE GUIDED RIGHT UPPER EXTREMITY PICC IMAGE GUIDED EXCHANGE INTERNAL EXTERNAL BILIARY DRAIN MEDICATIONS: None ANESTHESIA/SEDATION: Moderate (conscious) sedation was employed during this procedure. A total of Versed 2.0 mg and Fentanyl 100 mcg was administered intravenously. Moderate Sedation Time: 20 minutes. The patient's level of consciousness and vital signs were monitored  continuously by radiology nursing throughout the procedure under my direct supervision. FLUOROSCOPY TIME:  Fluoroscopy Time: 1 minutes 0 seconds (14.7 mGy). COMPLICATIONS: None PROCEDURE: Informed written consent was obtained from the patient after a thorough discussion of the procedural risks, benefits and alternatives. All questions were addressed. Maximal Sterile Barrier Technique was utilized including caps, mask, sterile gowns, sterile gloves, sterile drape, hand hygiene and skin antiseptic. A timeout was performed prior to the initiation of the procedure. Patient was position in the supine position on the fluoroscopy table with the right arm abducted 90 degrees. Ultrasound survey of the upper extremity was performed with images stored and sent to PACs. The right brachial vein was selected for access. Once the patient was prepped and draped in the usual sterile fashion, the skin and subcutaneous tissues were generously infiltrated with 1% lidocaine for local anesthesia. A micropuncture access kit was then used to access the targeted vein. Wire was passed centrally, confirmed to be within the venous system under fluoroscopy. A small stab incision was made with an 11 blade scalpel and the sheath was then placed over the wire. Estimated length of the catheter was then performed with the indwelling wire. Catheter was amputated at 39 cm length and placed with coaxial wire through the peel-away. Double lumen, power injectable PICC in the brachial vein. Tip confirmed at the cavoatrial junction, and the catheter is ready for use. Stat lock was placed. We then address the nonfunctional internal/external biliary drain. Initial images of the abdomen demonstrates that the drain has been withdrawn compared to the position on the prior placement Contrast injected through the drain demonstrates some resistance. The catheter was ligated, 1% lidocaine was administered at the skin, and the retention suture was cut. A Bentson  wire was passed, which met some resistance in the drain, likely debris. Wire was passed to the duodenum. Catheter was then withdrawn on the wire and a new 12 French 45 cm internal/external biliary drain was placed via the left approach. Contrast was injected confirming the location. Drain was sutured in position and attached to gravity drainage Patient tolerated the procedure well and remained hemodynamically stable throughout. No complications were encountered and no significant blood loss. IMPRESSION: Status post right upper extremity brachial vein PICC, double lumen, ready for use. Status post exchange of partially occluded internal/external biliary drain with a new 12 French drain placed to gravity. Signed, Dulcy Fanny. Dellia Nims, RPVI [facility] Electronically Signed   By: Corrie Mckusick D.O.   On: 07/27/2020 14:59  IR BILIARY DRAIN PLACEMENT WITH CHOLANGIOGRAM  Result Date: 07/08/2020 INDICATION: Jaundice, biliary obstruction, pancreas cancer, successful ERCP EXAM: ULTRASOUND FLUOROSCOPIC LEFT INTERNAL EXTERNAL 10 FRENCH BILIARY DRAIN MEDICATIONS: 2 g Mefoxin; The antibiotic was administered within an appropriate time frame prior to the initiation of the procedure. ANESTHESIA/SEDATION: Moderate (conscious) sedation was employed during this procedure. A total of Versed 2.0 mg and Fentanyl 150 mcg was administered intravenously. Moderate Sedation Time: 32 minutes. The patient's level of consciousness and vital signs were monitored continuously by radiology nursing throughout the procedure under my direct supervision. FLUOROSCOPY TIME:  Fluoroscopy Time: 5 minutes 12 seconds (89 mGy). COMPLICATIONS: None immediate. PROCEDURE: Informed written consent was obtained from the patient after a thorough discussion of the procedural risks, benefits and alternatives. All questions were addressed. Maximal Sterile Barrier Technique was utilized including caps,  mask, sterile gowns, sterile gloves, sterile drape, hand hygiene and skin antiseptic. A timeout was performed prior to the initiation of the procedure. Previous imaging reviewed. Preliminary ultrasound performed of the liver. Left hepatic ducts were localized from a subxiphoid approach. Overlying skin marked. Under sterile conditions and local anesthesia, a 21 gauge 15 cm access was advanced percutaneously under ultrasound into a peripheral left hepatic duct. Needle position confirmed with ultrasound and return of clear bile. Ultrasound images obtained for documentation. Contrast injection performed for initial cholangiogram. This demonstrates diffuse biliary dilatation and distal obstruction. 018 guidewire advanced into the biliary tree followed by the transitional dilator set. Amplatz guidewire advanced followed by a Kumpe catheter. Kumpe catheter advanced into the duodenum. Contrast injection confirms position in the duodenum. Over an Amplatz guidewire, tract dilatation performed to insert a 10 [place] internal external biliary drain. Retention loop positioned in the duodenum. Contrast injection confirms position. Images obtained for documentation. Cholangiogram confirms biliary obstruction. Cystic duct is patent with numerous gallstones noted. IMPRESSION: Successful 10 French left internal external biliary drain for distal CBD obstruction. Electronically Signed   By: Jerilynn Mages.  Shick M.D.   On: 07/08/2020 12:19   IR EXCHANGE BILIARY DRAIN  Result Date: 07/27/2020 INDICATION: 80 year old female with pancreatic carcinoma, prior internal external biliary drain now with suspected occlusion. She presents for IV access for possible chemotherapy and for exchange of the nonfunctional biliary drain. EXAM: IMAGE GUIDED RIGHT UPPER EXTREMITY PICC IMAGE GUIDED EXCHANGE INTERNAL EXTERNAL BILIARY DRAIN MEDICATIONS: None ANESTHESIA/SEDATION: Moderate (conscious) sedation was employed during this procedure. A total of Versed 2.0 mg  and Fentanyl 100 mcg was administered intravenously. Moderate Sedation Time: 20 minutes. The patient's level of consciousness and vital signs were monitored continuously by radiology nursing throughout the procedure under my direct supervision. FLUOROSCOPY TIME:  Fluoroscopy Time: 1 minutes 0 seconds (14.7 mGy). COMPLICATIONS: None PROCEDURE: Informed written consent was obtained from the patient after a thorough discussion of the procedural risks, benefits and alternatives. All questions were addressed. Maximal Sterile Barrier Technique was utilized including caps, mask, sterile gowns, sterile gloves, sterile drape, hand hygiene and skin antiseptic. A timeout was performed prior to the initiation of the procedure. Patient was position in the supine position on the fluoroscopy table with the right arm abducted 90 degrees. Ultrasound survey of the upper extremity was performed with images stored and sent to PACs. The right brachial vein was selected for access. Once the patient was prepped and draped in the usual sterile fashion, the skin and subcutaneous tissues were generously infiltrated with 1% lidocaine for local anesthesia. A micropuncture access kit was then used to access the targeted vein. Wire was passed centrally, confirmed  to be within the venous system under fluoroscopy. A small stab incision was made with an 11 blade scalpel and the sheath was then placed over the wire. Estimated length of the catheter was then performed with the indwelling wire. Catheter was amputated at 39 cm length and placed with coaxial wire through the peel-away. Double lumen, power injectable PICC in the brachial vein. Tip confirmed at the cavoatrial junction, and the catheter is ready for use. Stat lock was placed. We then address the nonfunctional internal/external biliary drain. Initial images of the abdomen demonstrates that the drain has been withdrawn compared to the position on the prior placement Contrast injected through  the drain demonstrates some resistance. The catheter was ligated, 1% lidocaine was administered at the skin, and the retention suture was cut. A Bentson wire was passed, which met some resistance in the drain, likely debris. Wire was passed to the duodenum. Catheter was then withdrawn on the wire and a new 12 French 45 cm internal/external biliary drain was placed via the left approach. Contrast was injected confirming the location. Drain was sutured in position and attached to gravity drainage Patient tolerated the procedure well and remained hemodynamically stable throughout. No complications were encountered and no significant blood loss. IMPRESSION: Status post right upper extremity brachial vein PICC, double lumen, ready for use. Status post exchange of partially occluded internal/external biliary drain with a new 12 French drain placed to gravity. Signed, Dulcy Fanny. Dellia Nims, RPVI [facility] Radiology Electronically Signed   By: Corrie Mckusick D.O.   On: 07/27/2020 14:59    PERFORMANCE STATUS (ECOG) : 2 - Symptomatic, <50% confined to bed  Review of Systems Unless otherwise noted, a complete review of systems is negative.  Physical Exam General: NAD Pulmonary: Unlabored Extremities: no edema, no joint deformities Skin: no rashes Neurological: Weakness but otherwise nonfocal  IMPRESSION: Patient known to me from the clinic.  Dr. Rogue Bussing and I met with patient, daughter, and son-in-law.  In light of her current medical problems including GI bleed, poor performance status, and hepatic/renal dysfunction, patient has not felt to be a candidate for more systemic chemotherapy in the near future.  Family is awaiting GI consult.  I did introduce the idea of hospice involvement if their goal was to focus on comfort and quality of life.  However, no decisions were made today.  We discussed CODE STATUS and the probable futile nature of  resuscitation in the setting of an advanced malignancy.  Patient states that she is "at peace" no matter what happens.  However, she says that she needs to speak with her family prior to making any decisions.  PLAN: -Continue current scope of treatment -Patient/family considering goals -Will follow  Case and plan discussed with Dr. Rogue Bussing and Dr. Posey Pronto   Time Total: 60 minutes  Visit consisted of counseling and education dealing with the complex and emotionally intense issues of symptom management and palliative care in the setting of serious and potentially life-threatening illness.Greater than 50%  of this time was spent counseling and coordinating care related to the above assessment and plan.  Signed by: Altha Harm, PhD, NP-C

## 2020-07-29 NOTE — Progress Notes (Signed)
[place] [place] NAME: Tracy Bolton    MR#:  [id number]  DATE OF BIRTH:  25-Sep-1940  SUBJECTIVE:   Patient came in with poor PO intake nausea and bilious vomiting at home. Early hours of the morning vomited large amount of blood about 200 mL with blood clot. Denies any abdominal pain at present blood pressure stable. No family in the room during my evaluation. According to the patient daughter is supposed to come visit REVIEW OF SYSTEMS:   Review of Systems  Constitutional: Positive for malaise/fatigue and weight loss. Negative for chills and fever.  HENT: Negative for ear discharge, ear pain and nosebleeds.   Eyes: Negative for blurred vision, pain and discharge.  Respiratory: Negative for sputum production, shortness of breath, wheezing and stridor.   Cardiovascular: Negative for chest pain, palpitations, orthopnea and PND.  Gastrointestinal: Positive for blood in stool. Negative for abdominal pain, diarrhea, nausea and vomiting.  Genitourinary: Negative for frequency and urgency.  Musculoskeletal: Negative for back pain and joint pain.  Neurological: Positive for weakness. Negative for sensory change, speech change and focal weakness.  Psychiatric/Behavioral: Negative for depression and hallucinations. The patient is not nervous/anxious.    Tolerating Diet:noTolerating PT:   DRUG ALLERGIES:  No Known Allergies  VITALS:  Blood pressure 98/71, pulse 84, temperature 98 F (36.7 C), temperature source Oral, resp. rate 16, height _0  (1.575 m), weight 80 kg, SpO2 99 %.  PHYSICAL EXAMINATION:   Physical Exam  GENERAL:  80 y.o.-year-old patient lying in the bed with no acute distress. obese HEENT: Head atraumatic, normocephalic. Oropharynx and nasopharynx clear.  LUNGS: Normal breath sounds bilaterally, no wheezing, rales, rhonchi. No use of accessory muscles of respiration.  CARDIOVASCULAR: S1, S2 normal. No murmurs, rubs, or  gallops.  ABDOMEN: Soft, nontender, nondistended. Bowel sounds present. No organomegaly or mass.  EXTREMITIES: No cyanosis, clubbing or edema b/l.    NEUROLOGIC: Cranial nerves II through XII are intact. No focal Motor or sensory deficits b/l.   PSYCHIATRIC:  patient is alert and oriented x 3.  SKIN: No obvious rash, lesion, or ulcer.   LABORATORY PANEL:  CBC Recent Labs  Lab 07/29/20 0404  WBC 11.2*  HGB 11.2*  HCT 34.3*  PLT 166    Chemistries  Recent Labs  Lab 07/28/20 1000 07/29/20 0404  NA  --  146*  K  --  3.5  CL  --  110  CO2  --  21*  GLUCOSE  --  129*  BUN  --  41*  CREATININE  --  1.65*  CALCIUM  --  9.4  MG 2.3  --   AST 239*  --   ALT 145*  --   ALKPHOS 121  --   BILITOT 6.8*  --    Cardiac Enzymes No results for input(s): TROPONINI in the last 168 hours. RADIOLOGY:  CT ABDOMEN PELVIS WO CONTRAST  Result Date: 07/28/2020 CLINICAL DATA:  Abdominal distension, lethargic and hypotensive. History of pancreatic cancer in this 80 year old female with transit padded a percutaneous biliary drainage catheter in place. EXAM: CT ABDOMEN AND PELVIS WITHOUT CONTRAST TECHNIQUE: Multidetector CT imaging of the abdomen and pelvis was performed following the standard protocol without IV contrast. COMPARISON:  May 23, 2020 and PET imaging as well as MR imaging since that time most recent cross-sectional comparison from July 14, 2020. FINDINGS: Lower chest: Nodular area along the major fissure in the RIGHT chest stable when compared to recent  PET-CT 10 x 7 mm. No effusion. No consolidation. Hepatobiliary: Liver with smooth contours. Percutaneous biliary drainage catheter in place, tip in the duodenum traversing LEFT hepatic lobe with side-hole marker well within the hepatic substance. Side-hole marker 5 cm into the liver. No perihepatic stranding at the site of entry. Contrast mixed with numerous stones in the gallbladder. Gallbladder is distended without signs of  pericholecystic stranding or fluid. No focal hepatic abnormality. Pancreas: Pancreas with stable appearance compared to recent PET imaging aside from potential mild peripancreatic stranding. Ill-defined mass in the pancreatic head not grossly changed. Spleen: Normal spleen. Adrenals/Urinary Tract: Normal appearance of adrenal glands. No perinephric stranding. No hydronephrosis. No perivesical stranding. No nephrolithiasis. Stomach/Bowel: No perianal stranding. Colonic diverticulosis without signs of diverticulitis. Normal appendix. Small hiatal hernia. No perigastric stranding. Small bowel is normal in caliber. No adjacent stranding. Vascular/Lymphatic: Calcified atheromatous plaque of the abdominal aorta. Small lymph nodes adjacent to pancreas not well evaluated. No new retroperitoneal adenopathy. Insert no pelvic adenopathy. Reproductive: No adnexal masses. Other: No free air. No ascites. Small fat containing umbilical hernia. Musculoskeletal: Spinal degenerative changes. No acute or destructive bone process. Degenerative changes of the bilateral hips with severe degenerative change. IMPRESSION: 1. Query mild peripancreatic stranding. Correlate with pancreatic enzymes. 2. Percutaneous biliary drainage catheter in place, tip in the duodenum traversing LEFT hepatic lobe with side-hole marker well within the hepatic substance. w no acute hepatic findings on noncontrast imaging. 3. Cholelithiasis with contrast in a distended gallbladder. No adjacent stranding or fluid. 4. Ill-defined mass in the pancreatic head not grossly changed. 5. Colonic diverticulosis without signs of diverticulitis. 6. Nodular area along the major fissure in the RIGHT chest stable when compared to recent PET-CT 10 x 7 mm. Attention on follow-up. 7. Aortic atherosclerosis. Aortic Atherosclerosis (ICD10-I70.0). Electronically Signed   By: Zetta Bills M.D.   On: 07/28/2020 11:56   CT Head Wo Contrast  Result Date: 07/28/2020 CLINICAL DATA:   Migraine-type headache.  Lethargy.  Hypotension. EXAM: CT HEAD WITHOUT CONTRAST TECHNIQUE: Contiguous axial images were obtained from the base of the skull through the vertex without intravenous contrast. COMPARISON:  08/21/2013. FINDINGS: Brain: No evidence of acute infarction, hemorrhage, hydrocephalus, extra-axial collection or mass lesion/mass effect. Vascular: No hyperdense vessel or unexpected calcification. Skull: Normal. Negative for fracture or focal lesion. Sinuses/Orbits: Visualized globes and orbits are unremarkable. Visualized sinuses are clear. Other: None. IMPRESSION: Negative unenhanced CT scan of the brain for age. Electronically Signed   By: Lajean Manes M.D.   On: 07/28/2020 11:36   IR Fluoro Guide CV Line Left  Result Date: 07/27/2020 INDICATION: 80 year old female with pancreatic carcinoma, prior internal external biliary drain now with suspected occlusion. She presents for IV access for possible chemotherapy and for exchange of the nonfunctional biliary drain. EXAM: IMAGE GUIDED RIGHT UPPER EXTREMITY PICC IMAGE GUIDED EXCHANGE INTERNAL EXTERNAL BILIARY DRAIN MEDICATIONS: None ANESTHESIA/SEDATION: Moderate (conscious) sedation was employed during this procedure. A total of Versed 2.0 mg and Fentanyl 100 mcg was administered intravenously. Moderate Sedation Time: 20 minutes. The patient's level of consciousness and vital signs were monitored continuously by radiology nursing throughout the procedure under my direct supervision. FLUOROSCOPY TIME:  Fluoroscopy Time: 1 minutes 0 seconds (14.7 mGy). COMPLICATIONS: None PROCEDURE: Informed written consent was obtained from the patient after a thorough discussion of the procedural risks, benefits and alternatives. All questions were addressed. Maximal Sterile Barrier Technique was utilized including caps, mask, sterile gowns, sterile gloves, sterile drape, hand hygiene and skin antiseptic. A timeout  was performed prior to the initiation of the  procedure. Patient was position in the supine position on the fluoroscopy table with the right arm abducted 90 degrees. Ultrasound survey of the upper extremity was performed with images stored and sent to PACs. The right brachial vein was selected for access. Once the patient was prepped and draped in the usual sterile fashion, the skin and subcutaneous tissues were generously infiltrated with 1% lidocaine for local anesthesia. A micropuncture access kit was then used to access the targeted vein. Wire was passed centrally, confirmed to be within the venous system under fluoroscopy. A small stab incision was made with an 11 blade scalpel and the sheath was then placed over the wire. Estimated length of the catheter was then performed with the indwelling wire. Catheter was amputated at 39 cm length and placed with coaxial wire through the peel-away. Double lumen, power injectable PICC in the brachial vein. Tip confirmed at the cavoatrial junction, and the catheter is ready for use. Stat lock was placed. We then address the nonfunctional internal/external biliary drain. Initial images of the abdomen demonstrates that the drain has been withdrawn compared to the position on the prior placement Contrast injected through the drain demonstrates some resistance. The catheter was ligated, 1% lidocaine was administered at the skin, and the retention suture was cut. A Bentson wire was passed, which met some resistance in the drain, likely debris. Wire was passed to the duodenum. Catheter was then withdrawn on the wire and a new 12 French 45 cm internal/external biliary drain was placed via the left approach. Contrast was injected confirming the location. Drain was sutured in position and attached to gravity drainage Patient tolerated the procedure well and remained hemodynamically stable throughout. No complications were encountered and no significant blood loss. IMPRESSION: Status post right upper extremity brachial vein  PICC, double lumen, ready for use. Status post exchange of partially occluded internal/external biliary drain with a new 12 French drain placed to gravity. Signed, Dulcy Fanny. Dellia Nims, RPVI [facility] Radiology Electronically Signed   By: Corrie Mckusick D.O.   On: 07/27/2020 14:59   IR EXCHANGE BILIARY DRAIN  Result Date: 07/27/2020 INDICATION: 80 year old female with pancreatic carcinoma, prior internal external biliary drain now with suspected occlusion. She presents for IV access for possible chemotherapy and for exchange of the nonfunctional biliary drain. EXAM: IMAGE GUIDED RIGHT UPPER EXTREMITY PICC IMAGE GUIDED EXCHANGE INTERNAL EXTERNAL BILIARY DRAIN MEDICATIONS: None ANESTHESIA/SEDATION: Moderate (conscious) sedation was employed during this procedure. A total of Versed 2.0 mg and Fentanyl 100 mcg was administered intravenously. Moderate Sedation Time: 20 minutes. The patient's level of consciousness and vital signs were monitored continuously by radiology nursing throughout the procedure under my direct supervision. FLUOROSCOPY TIME:  Fluoroscopy Time: 1 minutes 0 seconds (14.7 mGy). COMPLICATIONS: None PROCEDURE: Informed written consent was obtained from the patient after a thorough discussion of the procedural risks, benefits and alternatives. All questions were addressed. Maximal Sterile Barrier Technique was utilized including caps, mask, sterile gowns, sterile gloves, sterile drape, hand hygiene and skin antiseptic. A timeout was performed prior to the initiation of the procedure. Patient was position in the supine position on the fluoroscopy table with the right arm abducted 90 degrees. Ultrasound survey of the upper extremity was performed with images stored and sent to PACs. The right brachial vein was selected for access. Once the patient was prepped and draped in the usual sterile fashion, the skin and subcutaneous tissues were generously  infiltrated with 1% lidocaine for local anesthesia. A micropuncture access kit was then used to access the targeted vein. Wire was passed centrally, confirmed to be within the venous system under fluoroscopy. A small stab incision was made with an 11 blade scalpel and the sheath was then placed over the wire. Estimated length of the catheter was then performed with the indwelling wire. Catheter was amputated at 39 cm length and placed with coaxial wire through the peel-away. Double lumen, power injectable PICC in the brachial vein. Tip confirmed at the cavoatrial junction, and the catheter is ready for use. Stat lock was placed. We then address the nonfunctional internal/external biliary drain. Initial images of the abdomen demonstrates that the drain has been withdrawn compared to the position on the prior placement Contrast injected through the drain demonstrates some resistance. The catheter was ligated, 1% lidocaine was administered at the skin, and the retention suture was cut. A Bentson wire was passed, which met some resistance in the drain, likely debris. Wire was passed to the duodenum. Catheter was then withdrawn on the wire and a new 12 French 45 cm internal/external biliary drain was placed via the left approach. Contrast was injected confirming the location. Drain was sutured in position and attached to gravity drainage Patient tolerated the procedure well and remained hemodynamically stable throughout. No complications were encountered and no significant blood loss. IMPRESSION: Status post right upper extremity brachial vein PICC, double lumen, ready for use. Status post exchange of partially occluded internal/external biliary drain with a new 12 French drain placed to gravity. Signed, Dulcy Fanny. Dellia Nims, RPVI [facility] Radiology Electronically Signed   By: Corrie Mckusick D.O.   On: 07/27/2020 14:59   ASSESSMENT AND PLAN:   JANECIA PALAU is a 80  y.o. female with medical history significant for pancreatic cancer diagnosed in February 2022, presents to the emergency department from oncology clinic for chief concerns of intractable nausea and vomiting.  G.I. bleed/hematemesis-- new onset suspect pancreatic adenocarcinoma iterating into the duodenum causing G.I. bleed -- patient vomited bright red blood with large clot about 200 mL this morning. -- Dynamically stable -- hemoglobin 11.2 -- avoid aspirin, and said, antiplatelet agent. -- IV Protonix BID -- G.I. consultation with Dr. Haig Prophet case discussed -- monitor H&H transfuse as needed. 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 Rogue Bussing -- palliative care and Dr. B to meet with patient and daughter this afternoon  Failure to thrive/poor PO intake secondary to ongoing cancer -- follow dietary recommendation  Elevated LFTs with hyperbilirubinemia secondary to obstructive jaundice from encroaching pancreatic adenocarcinoma status post exchange of biliary stent by IR on May 4th -- continue to monitor LFTs.  DVT prophylaxis SCD in the setting of G.I. bleed  Procedures: Family communication : Consults : palliative care, oncology CODE STATUS: full code DVT Prophylaxis : SCD Level of care: Med-Surg Status is: inpatient  Patient is having G.I. bleed/hematemesis and AKI with poor PO intake nausea and vomiting in the setting of pancreatic cancer with obstructive jaundice. Continue IV fluids IV Protonix await oncology/palliative care meeting with patient and daughter.  Dispo: The patient is from: Home  Anticipated d/c is to: TBD              Patient currently  is not medically stable to d/c.   Difficult to place patient No        TOTAL TIME TAKING CARE OF THIS PATIENT: 35 minutes.  >50% time spent on counselling and coordination of care  Note: This dictation was prepared with Dragon dictation along with smaller phrase technology. Any transcriptional errors that result from this process are unintentional.  Fritzi Mandes M.D    [facility]   CC: Primary care physician; Delsa Grana, PA-CPatient ID: Tracy Bolton, female   DOB: 1940-07-06, 80 y.o.   MRN: [id number]

## 2020-07-29 NOTE — Assessment & Plan Note (Addendum)
#   80 year old female patient with locally advanced pancreatic cancer-currently admitted to hospital for hypotension/acute renal failure/obstructive jaundice  #Pancreatic adenocarcinoma locally advanced/invading duodenum; 1 cm peripancreatic lymphadenopathy; CT scan at [facility] 2022]-hepatic artery abutment; T4N1-stage III disease.  Concerning for progressive disease-given nausea vomiting/hematemesis [see below].  Hold chemotherapy given acute issues.  #Nausea vomiting hematemesis-likely secondary to locally advanced malignancy involving the duodenum/gastric outlet obstruction-discussed the possibility of duodenal stent.  Awaiting evaluation at tertiary center at [facility].  Currently NPO.  Discussed with Dr. Luanna Salk GI at [facility] duodenal stenting capability.  #Biliary obstruction-secondary to pancreatic malignancy; s/p recent exchange of biliary drain [IR on 5/4]-slight improvement of LFTs.  Stable.  #Acute renal failure-likely prerenal-creatinine 0.8 improved.  #CODE STATUS-full as per patient/family preference.  S/p palliative care evaluation.  Aware of overall poor prognosis. Discussed with Dr. Posey Pronto.  Discussed with the patient's daughter, Olin Hauser at length.  In agreement with the plan.  Addendum: Patient finally has a bed/transferred to [facility].

## 2020-07-30 DIAGNOSIS — N179 Acute kidney failure, unspecified: Secondary | ICD-10-CM | POA: Diagnosis not present

## 2020-07-30 DIAGNOSIS — C259 Malignant neoplasm of pancreas, unspecified: Secondary | ICD-10-CM | POA: Diagnosis not present

## 2020-07-30 DIAGNOSIS — I959 Hypotension, unspecified: Secondary | ICD-10-CM | POA: Diagnosis not present

## 2020-07-30 DIAGNOSIS — K831 Obstruction of bile duct: Secondary | ICD-10-CM | POA: Diagnosis not present

## 2020-07-30 LAB — HEPATIC FUNCTION PANEL
ALT: 100 U/L — ABNORMAL HIGH (ref 0–44)
AST: 149 U/L — ABNORMAL HIGH (ref 15–41)
Albumin: 2.8 g/dL — ABNORMAL LOW (ref 3.5–5.0)
Alkaline Phosphatase: 79 U/L (ref 38–126)
Bilirubin, Direct: 2.6 mg/dL — ABNORMAL HIGH (ref 0.0–0.2)
Indirect Bilirubin: 2.7 mg/dL — ABNORMAL HIGH (ref 0.3–0.9)
Total Bilirubin: 5.3 mg/dL — ABNORMAL HIGH (ref 0.3–1.2)
Total Protein: 6.4 g/dL — ABNORMAL LOW (ref 6.5–8.1)

## 2020-07-30 MED ORDER — LACTATED RINGERS IV SOLN: INTRAVENOUS | Status: DC

## 2020-07-30 NOTE — Progress Notes (Signed)
[facility] [place] NAME: Tracy Bolton    MR#:  [id number]  DATE OF BIRTH:  15-Oct-1940  SUBJECTIVE:   No bloody vomit. Mild bilious vomit today No fever No abd pain REVIEW OF SYSTEMS:   Review of Systems  Constitutional: Positive for malaise/fatigue and weight loss. Negative for chills and fever.  HENT: Negative for ear discharge, ear pain and nosebleeds.   Eyes: Negative for blurred vision, pain and discharge.  Respiratory: Negative for sputum production, shortness of breath, wheezing and stridor.   Cardiovascular: Negative for chest pain, palpitations, orthopnea and PND.  Gastrointestinal: Positive for blood in stool. Negative for abdominal pain, diarrhea, nausea and vomiting.  Genitourinary: Negative for frequency and urgency.  Musculoskeletal: Negative for back pain and joint pain.  Neurological: Positive for weakness. Negative for sensory change, speech change and focal weakness.  Psychiatric/Behavioral: Negative for depression and hallucinations. The patient is not nervous/anxious.    Tolerating Diet:noTolerating PT:   DRUG ALLERGIES:  No Known Allergies  VITALS:  Blood pressure 119/68, pulse 64, temperature 98 F (36.7 C), resp. rate 16, height 5' 2" (1.575 m), weight 80 kg, SpO2 100 %.  PHYSICAL EXAMINATION:   Physical Exam  GENERAL:  80 y.o.-year-old patient lying in the bed with no acute distress. obese HEENT: Head atraumatic, normocephalic. Oropharynx and nasopharynx clear.  LUNGS: Normal breath sounds bilaterally, no wheezing, rales, rhonchi. No use of accessory muscles of respiration.  CARDIOVASCULAR: S1, S2 normal. No murmurs, rubs, or gallops.  ABDOMEN: Soft, nontender, nondistended. Bowel sounds present. No organomegaly or mass.  EXTREMITIES: No cyanosis, clubbing or edema b/l.    NEUROLOGIC: Cranial nerves II through XII are intact. No focal Motor or sensory deficits b/l.   PSYCHIATRIC:  patient is alert and  oriented x 3.  SKIN: No obvious rash, lesion, or ulcer.   LABORATORY PANEL:  CBC Recent Labs  Lab 07/29/20 0404  WBC 11.2*  HGB 11.2*  HCT 34.3*  PLT 166    Chemistries  Recent Labs  Lab 07/28/20 1000 07/29/20 0404  NA  --  146*  K  --  3.5  CL  --  110  CO2  --  21*  GLUCOSE  --  129*  BUN  --  41*  CREATININE  --  1.65*  CALCIUM  --  9.4  MG 2.3  --   AST 239*  --   ALT 145*  --   ALKPHOS 121  --   BILITOT 6.8*  --    Cardiac Enzymes No results for input(s): TROPONINI in the last 168 hours. RADIOLOGY:  CT ABDOMEN PELVIS WO CONTRAST  Result Date: 07/28/2020 CLINICAL DATA:  Abdominal distension, lethargic and hypotensive. History of pancreatic cancer in this 80 year old female with transit padded a percutaneous biliary drainage catheter in place. EXAM: CT ABDOMEN AND PELVIS WITHOUT CONTRAST TECHNIQUE: Multidetector CT imaging of the abdomen and pelvis was performed following the standard protocol without IV contrast. COMPARISON:  May 23, 2020 and PET imaging as well as MR imaging since that time most recent cross-sectional comparison from July 14, 2020. FINDINGS: Lower chest: Nodular area along the major fissure in the RIGHT chest stable when compared to recent PET-CT 10 x 7 mm. No effusion. No consolidation. Hepatobiliary: Liver with smooth contours. Percutaneous biliary drainage catheter in place, tip in the duodenum traversing LEFT hepatic lobe with side-hole marker well within the hepatic substance. Side-hole marker 5 cm into the liver. No perihepatic stranding at  the site of entry. Contrast mixed with numerous stones in the gallbladder. Gallbladder is distended without signs of pericholecystic stranding or fluid. No focal hepatic abnormality. Pancreas: Pancreas with stable appearance compared to recent PET imaging aside from potential mild peripancreatic stranding. Ill-defined mass in the pancreatic head not grossly changed. Spleen: Normal spleen. Adrenals/Urinary  Tract: Normal appearance of adrenal glands. No perinephric stranding. No hydronephrosis. No perivesical stranding. No nephrolithiasis. Stomach/Bowel: No perianal stranding. Colonic diverticulosis without signs of diverticulitis. Normal appendix. Small hiatal hernia. No perigastric stranding. Small bowel is normal in caliber. No adjacent stranding. Vascular/Lymphatic: Calcified atheromatous plaque of the abdominal aorta. Small lymph nodes adjacent to pancreas not well evaluated. No new retroperitoneal adenopathy. Insert no pelvic adenopathy. Reproductive: No adnexal masses. Other: No free air. No ascites. Small fat containing umbilical hernia. Musculoskeletal: Spinal degenerative changes. No acute or destructive bone process. Degenerative changes of the bilateral hips with severe degenerative change. IMPRESSION: 1. Query mild peripancreatic stranding. Correlate with pancreatic enzymes. 2. Percutaneous biliary drainage catheter in place, tip in the duodenum traversing LEFT hepatic lobe with side-hole marker well within the hepatic substance. w no acute hepatic findings on noncontrast imaging. 3. Cholelithiasis with contrast in a distended gallbladder. No adjacent stranding or fluid. 4. Ill-defined mass in the pancreatic head not grossly changed. 5. Colonic diverticulosis without signs of diverticulitis. 6. Nodular area along the major fissure in the RIGHT chest stable when compared to recent PET-CT 10 x 7 mm. Attention on follow-up. 7. Aortic atherosclerosis. Aortic Atherosclerosis (ICD10-I70.0). Electronically Signed   By: Geoffrey  Wile M.D.   On: 07/28/2020 11:56   CT Head Wo Contrast  Result Date: 07/28/2020 CLINICAL DATA:  Migraine-type headache.  Lethargy.  Hypotension. EXAM: CT HEAD WITHOUT CONTRAST TECHNIQUE: Contiguous axial images were obtained from the base of the skull through the vertex without intravenous contrast. COMPARISON:  08/21/2013. FINDINGS: Brain: No evidence of acute infarction, hemorrhage,  hydrocephalus, extra-axial collection or mass lesion/mass effect. Vascular: No hyperdense vessel or unexpected calcification. Skull: Normal. Negative for fracture or focal lesion. Sinuses/Orbits: Visualized globes and orbits are unremarkable. Visualized sinuses are clear. Other: None. IMPRESSION: Negative unenhanced CT scan of the brain for age. Electronically Signed   By: David  Ormond M.D.   On: 07/28/2020 11:36   ASSESSMENT AND PLAN:   Maliaka L Ledet is a 79 y.o. female with medical history significant for pancreatic cancer diagnosed in February 2022, presents to the emergency department from oncology clinic for chief concerns of intractable nausea and vomiting.  G.I. 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 bleed --5/6-- patient vomited bright red blood with large clot about 200 mL this morning. -- Dynamically stable -- hemoglobin 11.2 -- avoid aspirin, NSAID, antiplatelet agent. -- IV Protonix BID -- G.I. consultation with Dr. Locklear case discussed--nothing to offer. recommends [facility] transfer if family wants to pursue aggressive care -- monitor H&H transfuse as needed. 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 Brahmanday -- palliative care and Dr. B met with patient and daughter on 5/6-- they want to pursue aggressive care and would like to get transferred to [facility] if possible  Failure to thrive/poor PO intake secondary to ongoing cancer -- Nutrition Status: Nutrition Problem:   Inadequate oral intake Etiology: acute illness Signs/Symptoms: per patient/family report   Starting ice chips. ?TPN  Elevated LFTs with  hyperbilirubinemia secondary to obstructive jaundice from encroaching pancreatic adenocarcinoma status post exchange of biliary stent by IR on May 4th -- continue to monitor LFTs.  DVT prophylaxis SCD in the setting of G.I. bleed  Procedures: Family communication :dter [name] on the phone 5/7 Consults : palliative care, oncology CODE STATUS: full code DVT Prophylaxis : SCD Level of care: Med-Surg Status is: inpatient  Patient is having G.I. bleed/hematemesis and AKI with poor PO intake nausea and vomiting in the setting of pancreatic cancer with obstructive jaundice. Continue IV fluids IV Protonix. Family wants to consider aggressive care and transferred to [facility].  Dispo: The patient is from: Home              Anticipated d/c is to: TBD              Patient currently is not medically stable to d/c.   Difficult to place patient No        TOTAL TIME TAKING CARE OF THIS PATIENT: 35 minutes.  >50% time spent on counselling and coordination of care  Note: This dictation was prepared with Dragon dictation along with smaller phrase technology. Any transcriptional errors that result from this process are unintentional.  Fritzi Mandes M.D    [facility]   CC: Primary care physician; Delsa Grana, PA-CPatient ID: [name], female   DOB: 1941-02-28, 80 y.o.   MRN: [id number]

## 2020-07-30 NOTE — Progress Notes (Signed)
Tracy Bolton   DOB:1940/06/18   VV#:[id number]    Subjective: Admits to episode of hematemesis again this morning.  No abdominal pain.  No chest pain cough.  Objective:  Vitals:   07/30/20 1149 07/30/20 1510  BP: 130/74 116/66  Pulse: 63 66  Resp: 20 18  Temp: 98 F (36.7 C) 97.9 F (36.6 C)  SpO2: 100% 99%     Intake/Output Summary (Last 24 hours) at 07/30/2020 1930 Last data filed at 07/30/2020 6269 Gross per 24 hour  Intake 2603.59 ml  Output 2575 ml  Net 28.59 ml    Physical Exam Constitutional:      Comments: Resting in the bed.  Alone.  HENT:     Head: Normocephalic and atraumatic.     Mouth/Throat:     Pharynx: No oropharyngeal exudate.  Eyes:     Pupils: Pupils are equal, round, and reactive to light.  Cardiovascular:     Rate and Rhythm: Normal rate and regular rhythm.  Pulmonary:     Effort: No respiratory distress.     Breath sounds: No wheezing.     Comments: Clear to auscultation bilaterally.  No wheeze or crackles Abdominal:     General: Bowel sounds are normal. There is no distension.     Palpations: Abdomen is soft. There is no mass.     Tenderness: There is no abdominal tenderness. There is no guarding or rebound.  Musculoskeletal:        General: No tenderness. Normal range of motion.     Cervical back: Normal range of motion and neck supple.  Skin:    General: Skin is warm.  Neurological:     Mental Status: She is alert and oriented to person, place, and time.  Psychiatric:        Mood and Affect: Affect normal.      Labs:  Lab Results  Component Value Date   WBC 11.2 (H) 07/29/2020   HGB 11.2 (L) 07/29/2020   HCT 34.3 (L) 07/29/2020   MCV 84.7 07/29/2020   PLT 166 07/29/2020   NEUTROABS 8.0 (H) 07/28/2020    Lab Results  Component Value Date   NA 146 (H) 07/29/2020   K 3.5 07/29/2020   CL 110 07/29/2020   CO2 21 (L) 07/29/2020    Studies:  No results found.  Primary pancreatic cancer [facility]) # 80 year old female patient with  locally advanced pancreatic cancer-currently admitted to hospital for hypotension/acute renal failure/obstructive jaundice  #Pancreatic adenocarcinoma locally advanced/invading duodenum; 1 cm peripancreatic lymphadenopathy; CT scan at [facility] 2022]-hepatic artery abutment; T4N1-stage III disease.  Concerning for progressive disease-given nausea vomiting/hematemesis [see below].  Not a candidate for any chemotherapy at this time given acute medical problems see below  #Nausea vomiting hematemesis-likely secondary to locally advanced malignancy involving the duodenum/gastric outlet obstruction-consider GI evaluation at a tertiary center for possible duodenal stenting for palliative reasons.  Reviewed GI evaluation.   #Biliary obstruction-secondary to pancreatic malignancy; s/p recent exchange of biliary drain [IR on 5/4]-slight improvement of LFTs.  #Acute renal failure-likely prerenal-creatinine 1.6.  Stable continue IV fluids.  #S/p evaluation with palliative care-patient/family want to continue full code/aggressive measures.  #Prognosis: Unfortunately patient's prognosis is quite poor given multiple medical problems-I am doubtful if patient would ever regain a performance status to proceed with chemotherapy.  However continue current scope of care as per patient/family wishes.  Discussed with Dr. Posey Pronto.  Appreciate Dr. Posey Pronto efforts in contacting [facility]-regarding inpatient transfer.    Lenetta Quaker R  Rogue Bussing, MD 07/30/2020  7:30 PM

## 2020-07-30 NOTE — Progress Notes (Addendum)
Patient ID: Tracy Bolton, female   DOB: 12-Jan-1941, 80 y.o.   MRN: [id number]  I spoke with patient's daughter [name] over the phone she discussed with her sister who is in [place] and family wants to pursue transferred to [facility].   10 am:I have called [facility] transfer center and waiting to hear back from the hospitalist service.  12:26 pm--spoke with Dr Angelina Ok Medical oncologist at [facility] who has graciously accepted pt for transfer to [facility].Marland Kitchen At present [facility] is at capacity.

## 2020-07-30 NOTE — Progress Notes (Addendum)
Referral made to [facility] during previous admission. CSW asked [place] with [facility] if they are still active with patient. Per [place] they are active for [facility] services.  Oleh Genin, [place]

## 2020-07-31 DIAGNOSIS — K831 Obstruction of bile duct: Secondary | ICD-10-CM | POA: Diagnosis not present

## 2020-07-31 DIAGNOSIS — C259 Malignant neoplasm of pancreas, unspecified: Secondary | ICD-10-CM | POA: Diagnosis not present

## 2020-07-31 DIAGNOSIS — N179 Acute kidney failure, unspecified: Secondary | ICD-10-CM | POA: Diagnosis not present

## 2020-07-31 DIAGNOSIS — I959 Hypotension, unspecified: Secondary | ICD-10-CM | POA: Diagnosis not present

## 2020-07-31 DIAGNOSIS — R112 Nausea with vomiting, unspecified: Secondary | ICD-10-CM

## 2020-07-31 LAB — BASIC METABOLIC PANEL
Anion gap: 12 (ref 5–15)
BUN: 21 mg/dL (ref 8–23)
CO2: 25 mmol/L (ref 22–32)
Calcium: 9.2 mg/dL (ref 8.9–10.3)
Chloride: 108 mmol/L (ref 98–111)
Creatinine, Ser: 0.89 mg/dL (ref 0.44–1.00)
GFR, Estimated: >60 mL/min (ref >60)
Glucose, Bld: 89 mg/dL (ref 70–99)
Potassium: 3 mmol/L — ABNORMAL LOW (ref 3.5–5.1)
Sodium: 145 mmol/L (ref 135–145)

## 2020-07-31 LAB — HEMOGLOBIN: Hemoglobin: 10.2 g/dL — ABNORMAL LOW (ref 12.0–15.0)

## 2020-07-31 MED ORDER — POTASSIUM CHLORIDE 2 MEQ/ML IV SOLN
INTRAVENOUS | Status: DC
  Filled 2020-07-31: qty 1000

## 2020-07-31 NOTE — Discharge Summary (Signed)
[facility] [place] NAME: Tracy Bolton    MR#:  [id number]  DATE OF BIRTH:  08-23-1940  DATE OF ADMISSION:  07/28/2020 ADMITTING PHYSICIAN: Amy N Cox, DO  DATE OF DISCHARGE: 07/31/2020 to [facility]  PRIMARY CARE PHYSICIAN: Delsa Grana, PA-C    ADMISSION DIAGNOSIS:  Nausea & vomiting [R11.2] AKI (acute kidney injury) ([place]) [N17.9] Hypotension, unspecified hypotension type [I95.9]  DISCHARGE DIAGNOSIS:  Intractable Nausea/vomiting with one episode of hemetemesis Pancreatic adenocarcinoma with Duodenal stricture and Obstructive jaundice with biliary stent placement   SECONDARY DIAGNOSIS:   Past Medical History:  Diagnosis Date  . AKI (acute kidney injury) ([place]) 07/28/2020  . Arthritis   . Gout, chronic   . Hyperlipidemia   . Hypertension   . Osteoporosis   . Thyroid disease     HOSPITAL COURSE:   February 2022, presents to the emergency department from oncology clinic for chief concerns of intractable nausea and vomiting.  G.I. 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 bleed --5/6-- patient vomited bright red blood with large clot about 200 mL this morning. -- Dynamically stable -- hemoglobin 11.2 -- avoid aspirin, NSAID, antiplatelet agent. -- IV Protonix BID -- G.I. consultation with Dr. Haig Prophet case discussed--nothing to offer. recommends [facility] transfer if family wants to pursue aggressive care -- PRN antiemetics  --5/8--no beds at [facility], Dr B to d/w GI at [facility] in am to see if she can transfer there - 3:30 pm -pt  Has been accepted at [facility] and will transfer her. D/w dter Jeannene Patella in the room  Acute on chronic kidney injury baseline CKD IIIa -- likely in the setting of poor PO intake nausea and vomiting -- IV fluids for now -- creatinine of 2.13--- 1.65 -- baseline creatinine 0.85 April 2022  Pancreatic adenocarcinoma with obstructive jaundice and duodenal  stricture/stenosis--(from oncology note) -- patient seen by Dr. Rogue Bussing -- palliative care and Dr. B met with patient and daughter on 5/6-- they want to pursue aggressive care and would like to get transferred to [facility]  Failure to thrive/poor PO intake secondary to ongoing cancer -- Nutrition Status: Nutrition Problem: Inadequate oral intake Etiology: acute illness Signs/Symptoms: per patient/family report Starting ice chips.   Elevated LFTs with hyperbilirubinemia secondary to obstructive jaundice from encroaching pancreatic adenocarcinoma status post exchange of biliary stent by IR on May 4th -- continue to monitor LFTs.  DVT prophylaxis SCD in the setting of G.I. bleed  Pt's po home meds were held due to nausea and vomiting.  Procedures: Family communication :dter [name] on the phone 5/8 and dter Pam in the room Consults : palliative care, oncology CODE STATUS: full code DVT Prophylaxis : SCD Level of care: Med-Surg Status is: inpatient    Dispo: The patient is from: Home  Anticipated d/c is to: TBD  Patient currently is not medically stable to d/c.              Difficult to place patient No  transfer to [facility] today     CONSULTS OBTAINED:  Treatment Team:  Cammie Sickle, MD Lesly Rubenstein, MD  DRUG ALLERGIES:  No Known Allergies  DISCHARGE MEDICATIONS:   Allergies as of 07/31/2020   No Known Allergies     Medication List    STOP taking these medications   amLODipine 10 MG tablet Commonly known as: NORVASC   aspirin 81 MG tablet   atenolol 50 MG tablet Commonly known as: TENORMIN   blood  glucose meter kit and supplies Kit   lidocaine-prilocaine cream Commonly known as: EMLA   metFORMIN 500 MG 24 hr tablet Commonly known as: GLUCOPHAGE-XR   nystatin powder Commonly known as: MYCOSTATIN/NYSTOP   prochlorperazine 10 MG tablet Commonly known as: COMPAZINE   senna 8.6 MG Tabs tablet Commonly known as:  SENOKOT     TAKE these medications   allopurinol 100 MG tablet Commonly known as: ZYLOPRIM TAKE 1 TABLET BY MOUTH EVERY DAY   atorvastatin 10 MG tablet Commonly known as: LIPITOR TAKE 1 TABLET BY MOUTH EVERYDAY AT BEDTIME   ondansetron 8 MG tablet Commonly known as: ZOFRAN Take 1 tablet (8 mg total) by mouth every 8 (eight) hours as needed for nausea or vomiting.   oxyCODONE 5 MG immediate release tablet Commonly known as: Oxy IR/ROXICODONE Take 1 tablet (5 mg total) by mouth every 6 (six) hours as needed for severe pain or breakthrough pain.   pantoprazole 40 MG tablet Commonly known as: Protonix Take 1 tablet (40 mg total) by mouth daily.       If you experience worsening of your admission symptoms, develop shortness of breath, life threatening emergency, suicidal or homicidal thoughts you must seek medical attention immediately by calling 911 or calling your MD immediately  if symptoms less severe.  You Must read complete instructions/literature along with all the possible adverse reactions/side effects for all the Medicines you take and that have been prescribed to you. Take any new Medicines after you have completely understood and accept all the possible adverse reactions/side effects.   Please note  You were cared for by a hospitalist during your hospital stay. If you have any questions about your discharge medications or the care you received while you were in the hospital after you are discharged, you can call the unit and asked to speak with the hospitalist on call if the hospitalist that took care of you is not available. Once you are discharged, your primary care physician will handle any further medical issues. Please note that NO REFILLS for any discharge medications will be authorized once you are discharged, as it is imperative that you return to your primary care physician (or establish a relationship with a primary care physician if you do not have one) for your  aftercare needs so that they can reassess your need for medications and monitor your lab values.   DATA REVIEW:   CBC  Recent Labs  Lab 07/29/20 0404 07/31/20 0349  WBC 11.2*  --   HGB 11.2* 10.2*  HCT 34.3*  --   PLT 166  --     Chemistries  Recent Labs  Lab 07/28/20 1000 07/29/20 0404 07/30/20 0941 07/31/20 0349  NA  --    < >  --  145  K  --    < >  --  3.0*  CL  --    < >  --  108  CO2  --    < >  --  25  GLUCOSE  --    < >  --  89  BUN  --    < >  --  21  CREATININE  --    < >  --  0.89  CALCIUM  --    < >  --  9.2  MG 2.3  --   --   --   AST 239*  --  149*  --   ALT 145*  --  100*  --   ALKPHOS  121  --  79  --   BILITOT 6.8*  --  5.3*  --    < > = values in this interval not displayed.    Microbiology Results   Recent Results (from the past 240 hour(s))  Resp Panel by RT-PCR (Flu A&B, Covid) Nasopharyngeal Swab     Status: None   Collection Time: 07/28/20 10:00 AM   Specimen: Nasopharyngeal Swab; Nasopharyngeal(NP) swabs in vial transport medium  Result Value Ref Range Status   SARS Coronavirus 2 by RT PCR NEGATIVE NEGATIVE Final    Comment: (NOTE) SARS-CoV-2 target nucleic acids are NOT DETECTED.  The SARS-CoV-2 RNA is generally detectable in upper respiratory specimens during the acute phase of infection. The lowest concentration of SARS-CoV-2 viral copies this assay can detect is 138 copies/mL. A negative result does not preclude SARS-Cov-2 infection and should not be used as the sole basis for treatment or other patient management decisions. A negative result may occur with  improper specimen collection/handling, submission of specimen other than nasopharyngeal swab, presence of viral mutation(s) within the areas targeted by this assay, and inadequate number of viral copies(<138 copies/mL). A negative result must be combined with clinical observations, patient history, and epidemiological information. The expected result is Negative.  Fact  Sheet for Patients:  EntrepreneurPulse.com.au  Fact Sheet for Healthcare Providers:  IncredibleEmployment.be  This test is no t yet approved or cleared by the [place] FDA and  has been authorized for detection and/or diagnosis of SARS-CoV-2 by FDA under an Emergency Use Authorization (EUA). This EUA will remain  in effect (meaning this test can be used) for the duration of the COVID-19 declaration under Section 564(b)(1) of the Act, 21 U.S.C.section 360bbb-3(b)(1), unless the authorization is terminated  or revoked sooner.       Influenza A by PCR NEGATIVE NEGATIVE Final   Influenza B by PCR NEGATIVE NEGATIVE Final    Comment: (NOTE) The Xpert Xpress SARS-CoV-2/FLU/RSV plus assay is intended as an aid in the diagnosis of influenza from Nasopharyngeal swab specimens and should not be used as a sole basis for treatment. Nasal washings and aspirates are unacceptable for Xpert Xpress SARS-CoV-2/FLU/RSV testing.  Fact Sheet for Patients: EntrepreneurPulse.com.au  Fact Sheet for Healthcare Providers: IncredibleEmployment.be  This test is not yet approved or cleared by the [place] FDA and has been authorized for detection and/or diagnosis of SARS-CoV-2 by FDA under an Emergency Use Authorization (EUA). This EUA will remain in effect (meaning this test can be used) for the duration of the COVID-19 declaration under Section 564(b)(1) of the Act, 21 U.S.C. section 360bbb-3(b)(1), unless the authorization is terminated or revoked.  Performed at [facility], [address] [place], [place] [address]     RADIOLOGY:  No results found.   CODE STATUS:     Code Status Orders  (From admission, onward)         Start     Ordered   07/28/20 1339  Full code  Continuous        07/28/20 1339        Code Status History    Date Active Date Inactive Code Status Order ID Comments User  Context   07/07/2020 1804 07/09/2020 1947 Full Code [id number]  Kayleen Memos, DO Inpatient   05/21/2017 1115 05/22/2020 2200 Full Code [id number]  Arnetha Courser, MD Outpatient   Advance Care Planning Activity       TOTAL TIME TAKING CARE OF THIS PATIENT: *40* minutes.  Fritzi Mandes M.D  [facility]    CC: Primary care physician; Delsa Grana, PA-C

## 2020-07-31 NOTE — Progress Notes (Signed)
Spoke with Jeani Hawking, RN at [facility] transfer center and there aren't any beds at this time on the oncology floor.

## 2020-07-31 NOTE — Progress Notes (Signed)
Patient discharged via stretcher to [facility] with all belongings & report given to Pricilla Loveless., RN at [phone]. Patient going to bed 9319.

## 2020-07-31 NOTE — Progress Notes (Signed)
Tracy Bolton   DOB:1940/08/20   QQ#:[id number]    Subjective: Patient denies any hematemesis overnight.  Continues to complain of nausea/vomiting with minimal p.o. intake.  Denies abdominal pain.  No fevers or chills.  Objective:  Vitals:   07/31/20 1105 07/31/20 1648  BP: 116/68 117/75  Pulse: (!) 56 (!) 58  Resp: 18 18  Temp: 97.9 F (36.6 C) 98.4 F (36.9 C)  SpO2: 100% 98%     Intake/Output Summary (Last 24 hours) at 07/31/2020 1829 Last data filed at 07/31/2020 1740 Gross per 24 hour  Intake 2472.17 ml  Output 825 ml  Net 1647.17 ml    Physical Exam HENT:     Head: Normocephalic and atraumatic.     Mouth/Throat:     Pharynx: No oropharyngeal exudate.  Eyes:     Pupils: Pupils are equal, round, and reactive to light.  Cardiovascular:     Rate and Rhythm: Normal rate and regular rhythm.     Heart sounds: No friction rub.  Pulmonary:     Effort: No respiratory distress.     Breath sounds: No wheezing.     Comments: Decreased air entry bilaterally at the bases. Abdominal:     General: Bowel sounds are normal. There is no distension.     Palpations: Abdomen is soft. There is no mass.     Tenderness: There is no abdominal tenderness. There is no guarding or rebound.  Musculoskeletal:        General: No tenderness. Normal range of motion.     Cervical back: Normal range of motion and neck supple.  Skin:    General: Skin is warm.  Neurological:     Mental Status: She is alert and oriented to person, place, and time.  Psychiatric:        Mood and Affect: Affect normal.      Labs:  Lab Results  Component Value Date   WBC 11.2 (H) 07/29/2020   HGB 10.2 (L) 07/31/2020   HCT 34.3 (L) 07/29/2020   MCV 84.7 07/29/2020   PLT 166 07/29/2020   NEUTROABS 8.0 (H) 07/28/2020    Lab Results  Component Value Date   NA 145 07/31/2020   K 3.0 (L) 07/31/2020   CL 108 07/31/2020   CO2 25 07/31/2020    Studies:  No results found.  Primary pancreatic cancer [facility]) #  80 year old female patient with locally advanced pancreatic cancer-currently admitted to hospital for hypotension/acute renal failure/obstructive jaundice  #Pancreatic adenocarcinoma locally advanced/invading duodenum; 1 cm peripancreatic lymphadenopathy; CT scan at [facility] 2022]-hepatic artery abutment; T4N1-stage III disease.  Concerning for progressive disease-given nausea vomiting/hematemesis [see below].  Hold chemotherapy given acute issues.  #Nausea vomiting hematemesis-likely secondary to locally advanced malignancy involving the duodenum/gastric outlet obstruction-discussed the possibility of duodenal stent.  Awaiting evaluation at tertiary center at [facility].  Currently NPO.  Discussed with Dr. Luanna Salk GI at [facility] duodenal stenting capability.  #Biliary obstruction-secondary to pancreatic malignancy; s/p recent exchange of biliary drain [IR on 5/4]-slight improvement of LFTs.  Stable.  #Acute renal failure-likely prerenal-creatinine 0.8 improved.  #CODE STATUS-full as per patient/family preference.  S/p palliative care evaluation.  Aware of overall poor prognosis. Discussed with Dr. Posey Pronto.  Discussed with the patient's daughter, Olin Hauser at length.  In agreement with the plan.  Addendum: Patient finally has a bed/transferred to [facility].   Cammie Sickle, MD 07/31/2020  6:29 PM

## 2020-07-31 NOTE — Progress Notes (Signed)
[facility] [place] NAME: Nazarene Bunning    MR#:  [id number]  DATE OF BIRTH:  15-May-1940  SUBJECTIVE:   No bloody vomit. Mild bilious vomit today No fever No abd pain REVIEW OF SYSTEMS:   Review of Systems  Constitutional: Positive for malaise/fatigue and weight loss. Negative for chills and fever.  HENT: Negative for ear discharge, ear pain and nosebleeds.   Eyes: Negative for blurred vision, pain and discharge.  Respiratory: Negative for sputum production, shortness of breath, wheezing and stridor.   Cardiovascular: Negative for chest pain, palpitations, orthopnea and PND.  Gastrointestinal: Negative for abdominal pain, diarrhea, nausea and vomiting.  Genitourinary: Negative for frequency and urgency.  Musculoskeletal: Negative for back pain and joint pain.  Neurological: Positive for weakness. Negative for sensory change, speech change and focal weakness.  Psychiatric/Behavioral: Negative for depression and hallucinations. The patient is not nervous/anxious.    Tolerating Diet:no Tolerating PT: pending  DRUG ALLERGIES:  No Known Allergies  VITALS:  Blood pressure 116/68, pulse (!) 56, temperature 97.9 F (36.6 C), resp. rate 18, height 5' 2" (1.575 m), weight 80 kg, SpO2 100 %.  PHYSICAL EXAMINATION:   Physical Exam  GENERAL:  80 y.o.-year-old patient lying in the bed with no acute distress. obese HEENT: Head atraumatic, normocephalic. Oropharynx and nasopharynx clear.  LUNGS: Normal breath sounds bilaterally, no wheezing, rales, rhonchi. No use of accessory muscles of respiration.  CARDIOVASCULAR: S1, S2 normal. No murmurs, rubs, or gallops.  ABDOMEN: Soft, nontender, nondistended. Bowel sounds present. No organomegaly or mass.  EXTREMITIES: No cyanosis, clubbing or edema b/l.    NEUROLOGIC: Cranial nerves II through XII are intact. No focal Motor or sensory deficits b/l.   PSYCHIATRIC:  patient is alert and oriented x 3.   SKIN: No obvious rash, lesion, or ulcer.   LABORATORY PANEL:  CBC Recent Labs  Lab 07/29/20 0404 07/31/20 0349  WBC 11.2*  --   HGB 11.2* 10.2*  HCT 34.3*  --   PLT 166  --     Chemistries  Recent Labs  Lab 07/28/20 1000 07/29/20 0404 07/30/20 0941 07/31/20 0349  NA  --    < >  --  145  K  --    < >  --  3.0*  CL  --    < >  --  108  CO2  --    < >  --  25  GLUCOSE  --    < >  --  89  BUN  --    < >  --  21  CREATININE  --    < >  --  0.89  CALCIUM  --    < >  --  9.2  MG 2.3  --   --   --   AST 239*  --  149*  --   ALT 145*  --  100*  --   ALKPHOS 121  --  79  --   BILITOT 6.8*  --  5.3*  --    < > = values in this interval not displayed.   Cardiac Enzymes No results for input(s): TROPONINI in the last 168 hours. RADIOLOGY:  No results found. ASSESSMENT AND PLAN:   MARSHA GUNDLACH is a 80 y.o. female with medical history significant for pancreatic cancer diagnosed in February 2022, presents to the emergency department from oncology clinic for chief concerns of intractable nausea and vomiting.  G.I. bleed/hematemesis--suspect pancreatic  adenocarcinoma with h/o malignant Duodenal stricture causing G.I. bleed --5/6-- patient vomited bright red blood with large clot about 200 mL this morning. -- Dynamically stable -- hemoglobin 11.2 -- avoid aspirin, NSAID, antiplatelet agent. -- IV Protonix BID -- G.I. consultation with Dr. Haig Prophet case discussed--nothing to offer. recommends [facility] transfer if family wants to pursue aggressive care -- monitor H&H transfuse as needed. 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 Rogue Bussing -- palliative care and Dr. B met with patient and daughter on 5/6-- they want to pursue aggressive care and would like to get transferred to [facility] if possible  Failure to thrive/poor PO intake secondary to ongoing cancer -- Nutrition Status: Nutrition Problem: Inadequate oral intake Etiology: acute illness Signs/Symptoms: per patient/family report   Starting ice chips.   Elevated LFTs with hyperbilirubinemia secondary to obstructive jaundice from encroaching pancreatic adenocarcinoma status post exchange of biliary stent by IR on May 4th -- continue to monitor LFTs.  DVT prophylaxis SCD in the setting of G.I. bleed  Procedures: Family communication :dter [name] on the phone 5/8 Consults : palliative care, oncology CODE STATUS: full code DVT Prophylaxis : SCD Level of care: Med-Surg Status is: inpatient  Patient is having G.I. bleed/hematemesis and AKI with poor PO intake nausea and vomiting in the setting of pancreatic cancer with obstructive jaundice. Continue IV fluids IV Protonix. Family wants to consider aggressive care and transferred to [facility].  Dispo: The patient is from: Home              Anticipated d/c is to: TBD              Patient currently is not medically stable to d/c.   Difficult to place patient No  Awaiting transfer to tertiary  Care center      TOTAL TIME TAKING CARE OF THIS PATIENT: 25 minutes.  >50% time spent on counselling and coordination of care  Note: This dictation was prepared with Dragon dictation along with smaller phrase technology. Any transcriptional errors that result from this process are unintentional.  Fritzi Mandes M.D    [facility]   CC: Primary care physician; Delsa Grana, PA-CPatient ID: [name], female   DOB: 08-20-1940, 80 y.o.   MRN: [id number]

## 2020-07-31 NOTE — Consult Note (Signed)
PHARMACY CONSULT NOTE - FOLLOW UP  Pharmacy Consult for Electrolyte Monitoring and Replacement   Recent Labs: Potassium (mmol/L)  Date Value  07/31/2020 3.0 (L)  09/15/2013 3.7   Magnesium (mg/dL)  Date Value  07/28/2020 2.3  09/15/2013 2.2   Calcium (mg/dL)  Date Value  07/31/2020 9.2   Calcium, Total (mg/dL)  Date Value  09/15/2013 8.9   Albumin (g/dL)  Date Value  07/30/2020 2.8 (L)  05/19/2015 4.3  09/15/2013 3.0 (L)   Phosphorus (mg/dL)  Date Value  07/08/2020 2.8   Sodium (mmol/L)  Date Value  07/31/2020 145  05/19/2015 141  09/15/2013 142     Assessment: 80 yo F presenting with hypotension, vomiting, nausea, and drowsiness. Pt with pancreatic cancer on chemo. Additional PMH includes arthritis, gout, HLD, HTN, osteoporosis, and thyroid disease. Pharmacy has been consulted to monitor and replace electrolytes daily. MD request replacement be given via fluids.   Fluids: LR @ 75 mL/hr  Goal of Therapy:  Electrolytes WNL  Plan:  - K 3.0 - Will add KCl 40 mEq to LR @ 75 mL/hr - Monitor electrolytes with AM labs  Benn Moulder, PharmD Pharmacy Resident  07/31/2020 12:15 PM

## 2020-08-01 LAB — TYPE AND SCREEN
ABO/RH(D): A NEG
Antibody Screen: NEGATIVE

## 2020-08-02 LAB — TYPE AND SCREEN
ABO/RH(D): O POS
Antibody Screen: NEGATIVE

## 2020-08-03 ENCOUNTER — Encounter: Payer: Self-pay | Admitting: Internal Medicine

## 2020-08-11 ENCOUNTER — Inpatient Hospital Stay: Payer: Medicare PPO

## 2020-08-16 ENCOUNTER — Telehealth: Payer: Self-pay | Admitting: Internal Medicine

## 2020-08-16 NOTE — Telephone Encounter (Signed)
On 5/23-discussed with Dr. Manuella Ghazi at [facility] regarding recent diverting surgery done for duodenal obstruction.  Unable to place stent as per GI.  Suspicious liver nodules biopsied at surgery-pathology pending.  Given the deconditioning patient transferred to rehab.  Patient pending repeat evaluation at [facility]/surgery.  Patient will be referred back to [place] if patient's performance status improves.

## 2020-08-17 ENCOUNTER — Other Ambulatory Visit: Payer: Self-pay | Admitting: Family Medicine

## 2020-08-17 DIAGNOSIS — E782 Mixed hyperlipidemia: Secondary | ICD-10-CM

## 2020-08-25 ENCOUNTER — Inpatient Hospital Stay: Payer: Medicare PPO | Attending: Hospice and Palliative Medicine | Admitting: Hospice and Palliative Medicine

## 2020-08-25 ENCOUNTER — Telehealth: Payer: Self-pay

## 2020-08-25 DIAGNOSIS — Z515 Encounter for palliative care: Secondary | ICD-10-CM

## 2020-08-25 NOTE — Telephone Encounter (Signed)
Attempted to contact patient to schedule a Palliative Care consult appointment. No answer left a message to return call.  

## 2020-08-25 NOTE — Progress Notes (Signed)
I was unable to reach patient for scheduled MyChart visit.  Based on last note, there was mention that patient could be at rehab.  We will consult community palliative care.

## 2020-08-29 ENCOUNTER — Telehealth: Payer: Self-pay | Admitting: Nurse Practitioner

## 2020-08-29 NOTE — Telephone Encounter (Signed)
Called patient's home & cell number to offer to schedule a Palliative Consult, no answer - left message at both numbers with reason for call along with my name and call back number, requesting a return call to schedule.

## 2020-09-01 ENCOUNTER — Telehealth: Payer: Self-pay | Admitting: Nurse Practitioner

## 2020-09-01 NOTE — Telephone Encounter (Signed)
Called patient's home & cell number to offer to schedule a Palliative Consult, no answer - left message at both numbers requesting a return call to schedule visit and to answer any questions that she may have regarding our services.  Left my name and call back number.

## 2020-09-02 ENCOUNTER — Other Ambulatory Visit: Payer: Self-pay | Admitting: Family Medicine

## 2020-09-02 DIAGNOSIS — E782 Mixed hyperlipidemia: Secondary | ICD-10-CM

## 2020-09-08 ENCOUNTER — Telehealth: Payer: Self-pay | Admitting: *Deleted

## 2020-09-08 NOTE — Telephone Encounter (Addendum)
Earnestine Mealing, PA from [facility] called requesting to speak with Dr B regarding this mutual patient who is being discharged form hospital to update him and also to get him to see her in a couple of weeks [phone]

## 2020-09-09 ENCOUNTER — Telehealth: Payer: Self-pay | Admitting: Internal Medicine

## 2020-09-09 ENCOUNTER — Encounter: Payer: Self-pay | Admitting: Internal Medicine

## 2020-09-09 DIAGNOSIS — C259 Malignant neoplasm of pancreas, unspecified: Secondary | ICD-10-CM

## 2020-09-09 NOTE — Telephone Encounter (Signed)
On 6/16 & 6/17-tried to call Tracy Bolton at the number provided-nonfunctional.   Please give me the correct phone number.  C-schedule appointment in 2 weeks or so -MD labs CBC CMP CA [phone].   GB

## 2020-09-09 NOTE — Telephone Encounter (Signed)
On 06/17-I spoke to Earnestine Mealing; [facility].  Patient currently in [facility] facility/external biliary drain currently capped.  Also has a J-tube.  Liver biopsy negative for malignancy.  Also informed of my discussion with daughter as below.  Spoke to daughter Donovan Kail; patient nursing home in [place].  Given the logistical difficulty/traveling to [place]-recommend referral medical oncology locally.    Heather/colette-  [facility], [place], Dr.Induru re: locally advanced pancreatic cancer.   Thanks GB

## 2020-09-09 NOTE — Telephone Encounter (Signed)
Tracy Bolton - please confirm phone #

## 2020-09-09 NOTE — Addendum Note (Signed)
Addended by: Gloris Ham on: 09/09/2020 02:41 PM   Modules accepted: Orders

## 2020-09-09 NOTE — Telephone Encounter (Signed)
[phone] 

## 2020-09-09 NOTE — Addendum Note (Signed)
Addended by: Gloris Ham on: 09/09/2020 08:27 AM   Modules accepted: Orders

## 2020-09-09 NOTE — Telephone Encounter (Signed)
Referral/ Pt's records faxed to [phone])  [facility] [address] [place], [place] [address] [phone] (phone)

## 2020-09-09 NOTE — Telephone Encounter (Signed)
So gave Tracy Bolton Dr. B cell # to call and discuss care. Per facility she is in [place] near [place], they deal with her daughter Tracy Bolton and not sure they would be willing to drive 2 hrs to get here for all her appts that may come. Can you call Daughter to discuss case and see if there is somewhere closer for her to follow up care at. Please advise

## 2020-09-13 ENCOUNTER — Telehealth: Payer: Self-pay | Admitting: *Deleted

## 2020-09-13 NOTE — Telephone Encounter (Signed)
Patient was to be scheduled with Dr. Burke Keels

## 2020-09-13 NOTE — Telephone Encounter (Signed)
Incoming call form Dr Richardine Service office stating that he does not see Pancreatic cancer patients so the referral is not valid unless you want her to see GI Oncology Dr Burke Keels. Please return her call regarding this matter [phone]

## 2020-09-13 NOTE — Telephone Encounter (Signed)
Call returned. Explained that [place] the [place] area. Dr. Rogue Bussing does not have a preference. The patient is a transfer to an assisted facility in that area to be closer to the daughter. She will reach out to the daughter and the facility to arrange for the apts.  Per scheduler Dr. Lucia Estelle only sees headneck and lung cases at that particular clinic not pancreatic cancers.

## 2020-09-27 ENCOUNTER — Other Ambulatory Visit: Payer: Self-pay | Admitting: Family Medicine

## 2020-09-27 DIAGNOSIS — R7303 Prediabetes: Secondary | ICD-10-CM

## 2020-09-27 DIAGNOSIS — I10 Essential (primary) hypertension: Secondary | ICD-10-CM

## 2020-09-27 DIAGNOSIS — M1A00X0 Idiopathic chronic gout, unspecified site, without tophus (tophi): Secondary | ICD-10-CM

## 2020-10-04 ENCOUNTER — Telehealth: Payer: Self-pay

## 2020-10-04 NOTE — Telephone Encounter (Signed)
Copied from [place] [id number]. Topic: General - Deceased Patient >> 10/25/20  2:44 PM Pawlus, Brayton Layman A wrote: Pts family member called in to let the practice know that the pt passed away last week. FYI

## 2020-10-04 NOTE — Telephone Encounter (Signed)
Copied from [place] [id number]. Topic: General - Deceased Patient >> Oct 26, 2020  2:44 PM Pawlus, Brayton Layman A wrote: Pts family member called in to let the practice know that the pt passed away last week. FYI

## 2020-10-04 NOTE — Telephone Encounter (Signed)
Please start card.

## 2020-10-06 ENCOUNTER — Ambulatory Visit: Payer: Medicare PPO | Admitting: Family Medicine

## 2020-10-24 DEATH — deceased

## 2020-11-04 ENCOUNTER — Other Ambulatory Visit: Payer: Self-pay | Admitting: Family Medicine

## 2020-11-04 DIAGNOSIS — R7303 Prediabetes: Secondary | ICD-10-CM

## 2021-04-07 NOTE — Telephone Encounter (Signed)
noted 

## 2021-12-25 IMAGING — CT CT HEAD W/O CM
3 series · 16 of 47 positions shown, 19 images · non-contrast
Comparison: 08/21/2013.

CLINICAL DATA: Migraine-type headache.  Lethargy.  Hypotension.

EXAM:
CT HEAD WITHOUT CONTRAST
TECHNIQUE: Contiguous axial images were obtained from the base of the skull
through the vertex without intravenous contrast.

[Series 3: head wo · axial · 0.42mm/px · z∈[-143,-18]mm · 10 of 30 slices shown, 13 images]
[im 3/30  brain]
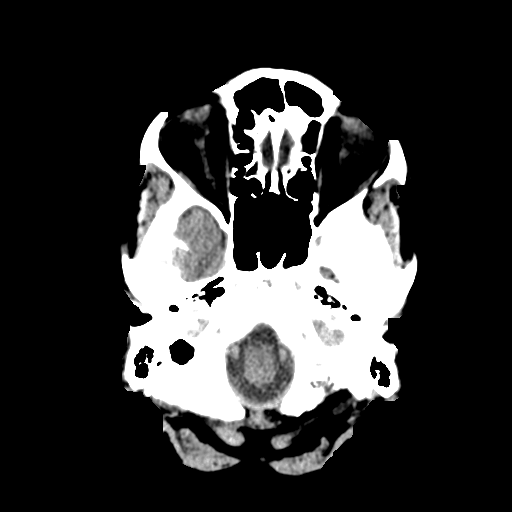
[im 3/30  bone]
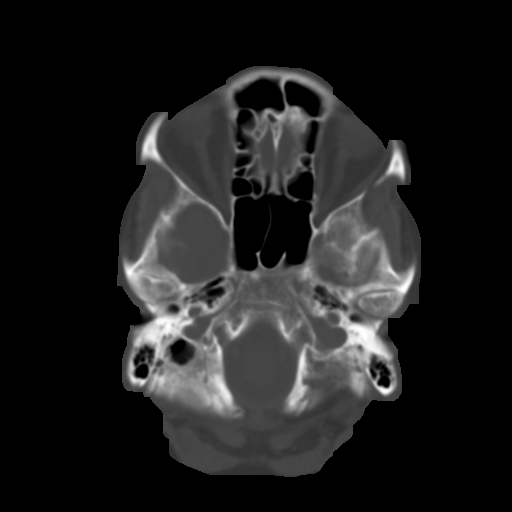
[im 6/30  brain]
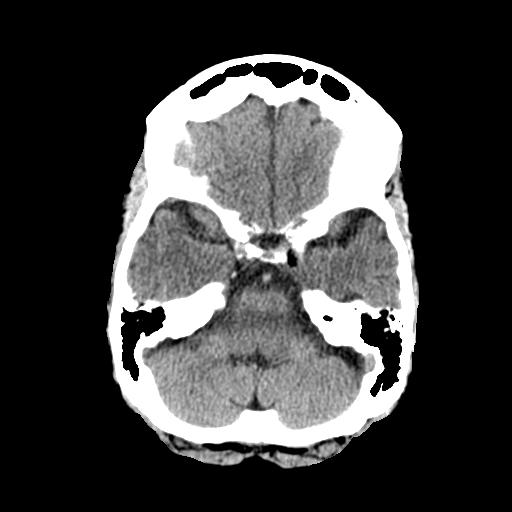
[im 9/30  brain]
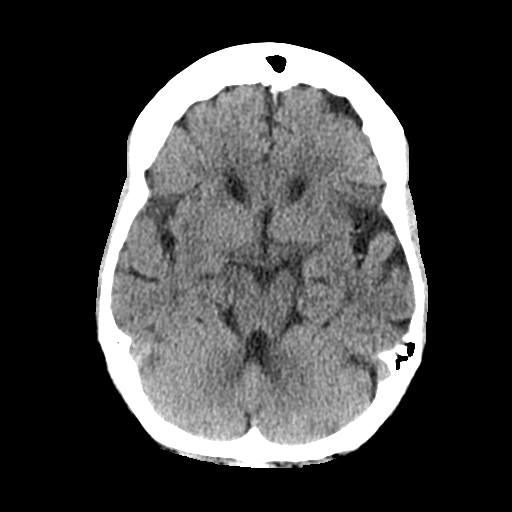
[im 11/30  brain]
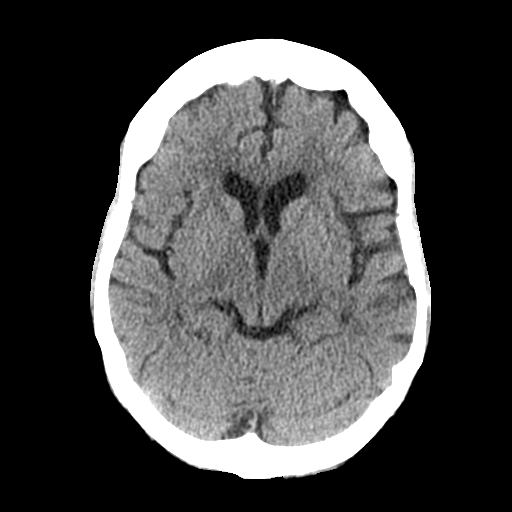
[im 14/30  brain]
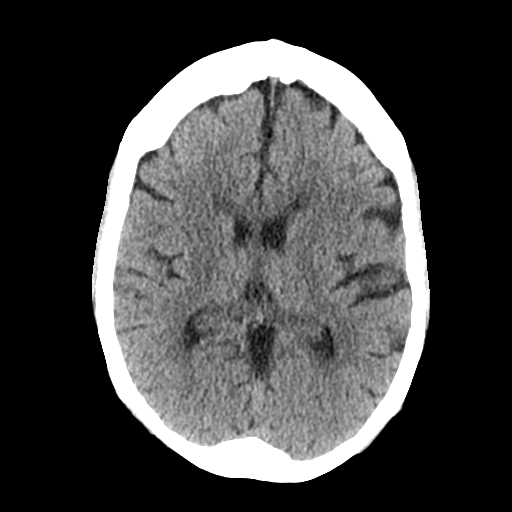
[im 14/30  bone]
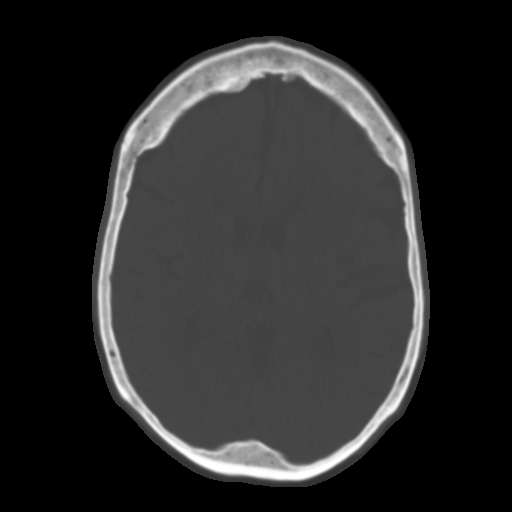
[im 17/30  brain]
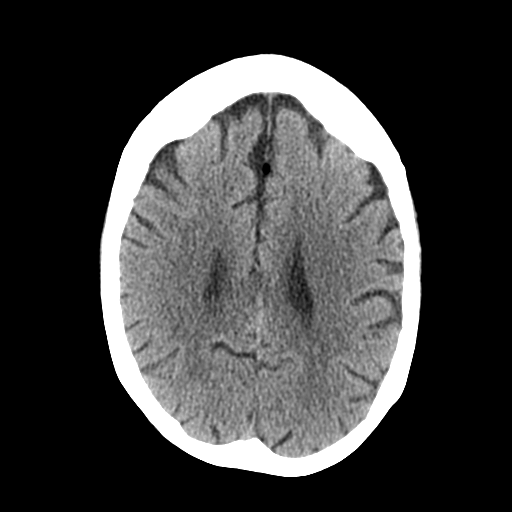
[im 20/30  brain]
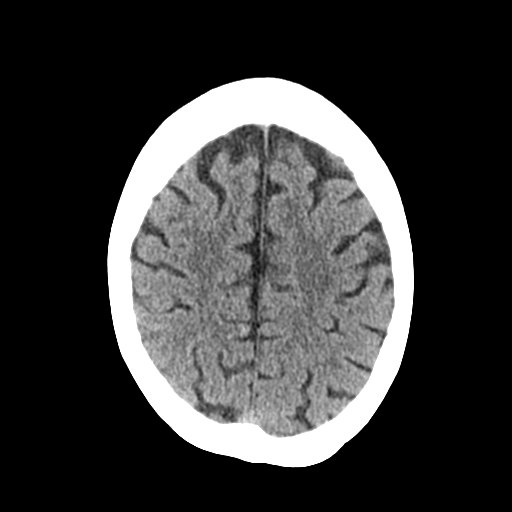
[im 23/30  brain]
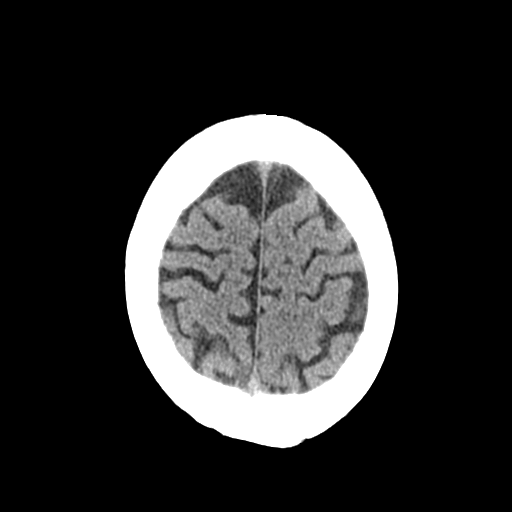
[im 25/30  brain]
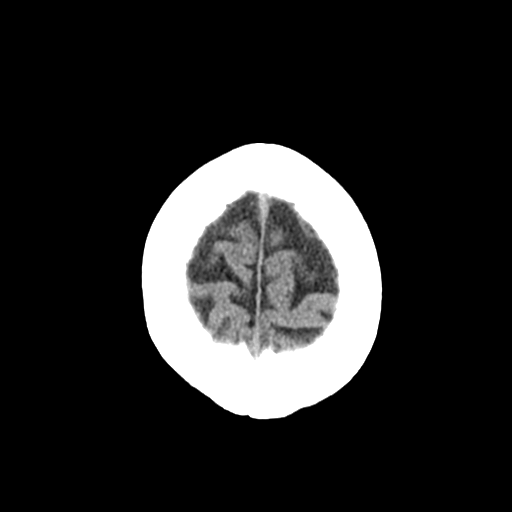
[im 25/30  bone]
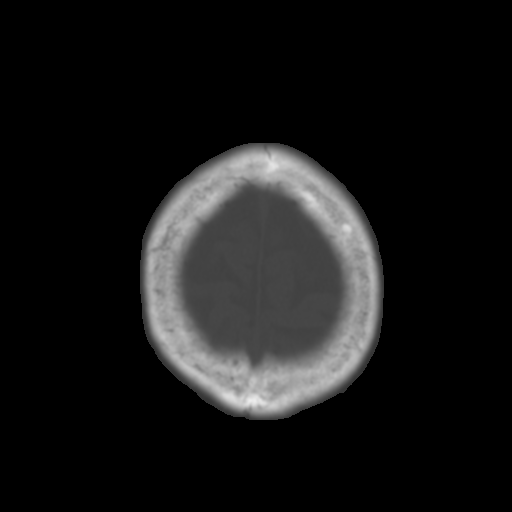
[im 28/30  brain]
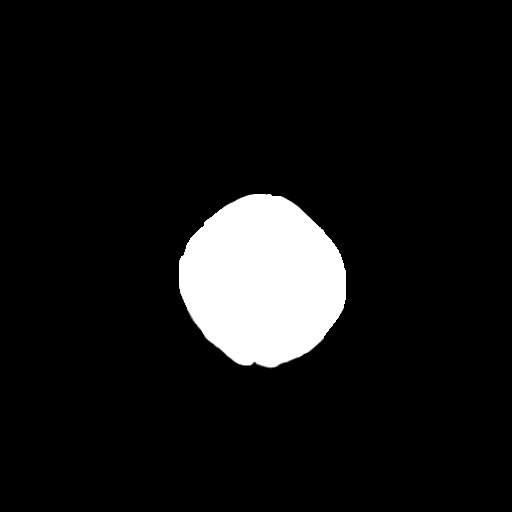

[Series 4: coronal soft tissue · coronal · 0.33mm/px · 3 of 66 slices shown]
[im 22/66  brain]
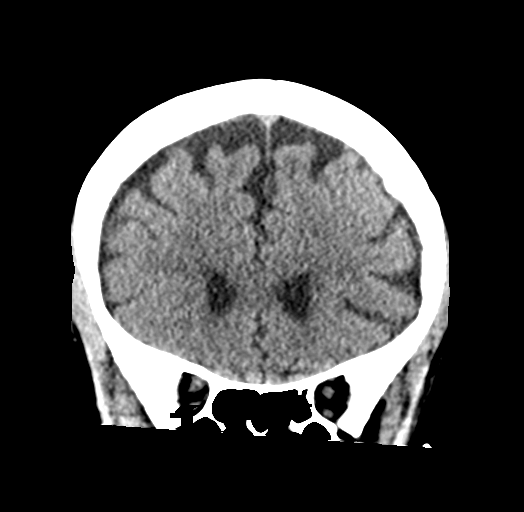
[im 29/66  brain]
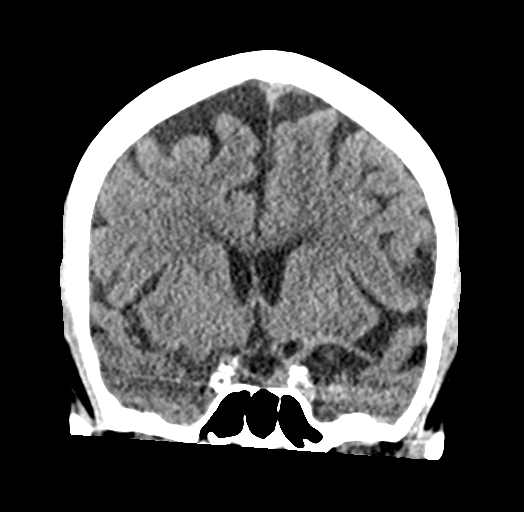
[im 37/66  brain]
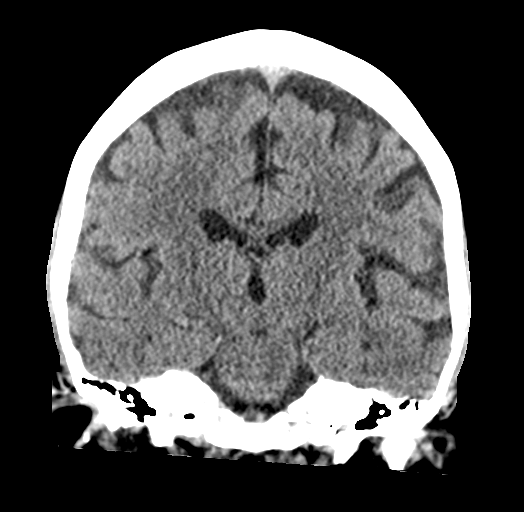

[Series 5: sagittal soft tissue · sagittal · 0.33mm/px · 3 of 51 slices shown]
[im 17/51  brain]
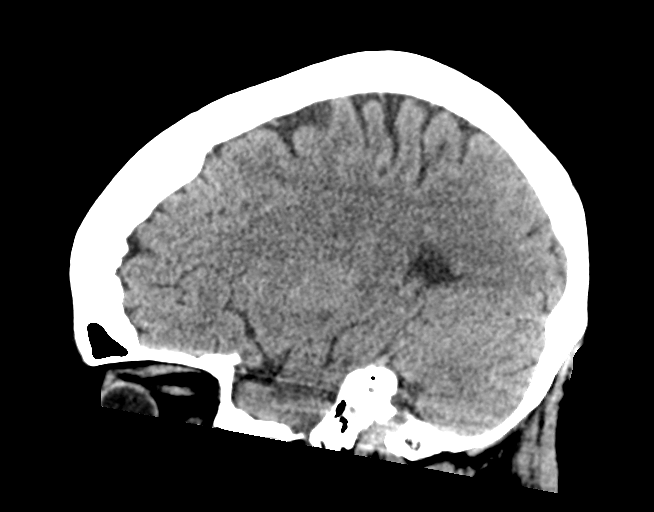
[im 26/51  brain]
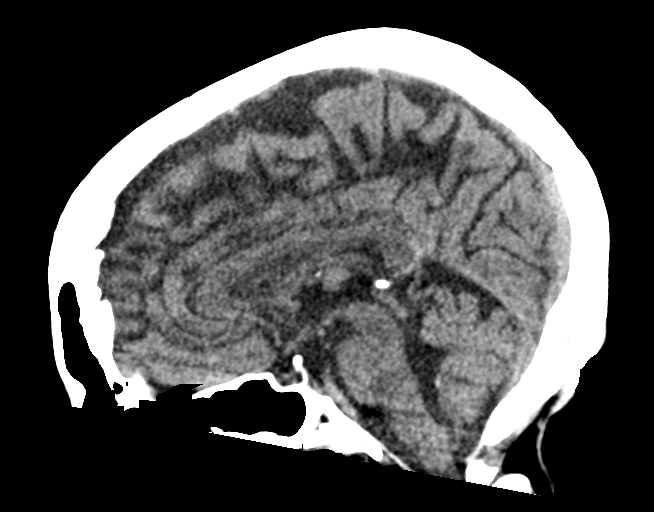
[im 34/51  brain]
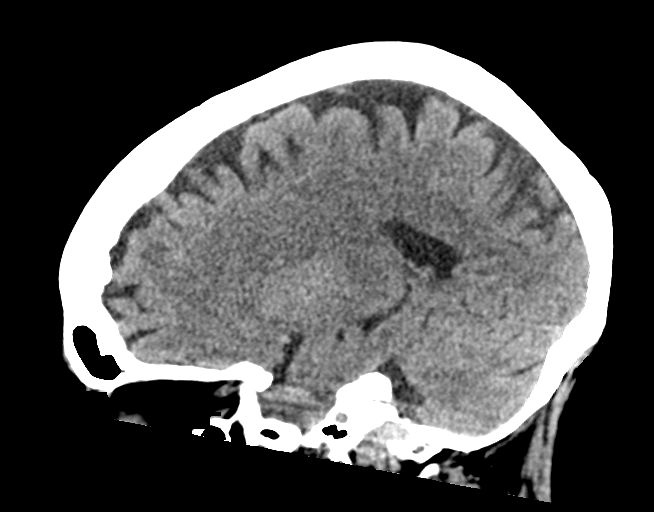

[16 of 47 positions shown; findings below may reference images not displayed]

FINDINGS: Brain: No evidence of acute infarction, hemorrhage, hydrocephalus,
extra-axial collection or mass lesion/mass effect.

Vascular: No hyperdense vessel or unexpected calcification.

Skull: Normal. Negative for fracture or focal lesion.

Sinuses/Orbits: Visualized globes and orbits are unremarkable.
Visualized sinuses are clear.

Other: None.
IMPRESSION: Negative unenhanced CT scan of the brain for age.

## 2021-12-25 IMAGING — CT CT ABD-PELV W/O CM
2 of 4 series · 15 of 46 positions shown, 17 images · non-contrast
Comparison: May 23, 2020 and PET imaging as well as MR imaging
since that time most recent cross-sectional comparison from [facility]

CLINICAL DATA: Abdominal distension, lethargic and hypotensive.
History of pancreatic cancer in this 79-year-old female with transit
padded a percutaneous biliary drainage catheter in place.

EXAM:
CT ABDOMEN AND PELVIS WITHOUT CONTRAST
TECHNIQUE: Multidetector CT imaging of the abdomen and pelvis was performed
following the standard protocol without IV contrast.

[Series 2: routine abd/pel wo · axial · 0.79mm/px · z∈[-784,-334]mm · 12 of 100 slices shown, 14 images]
[im 5/100  soft-tissue]
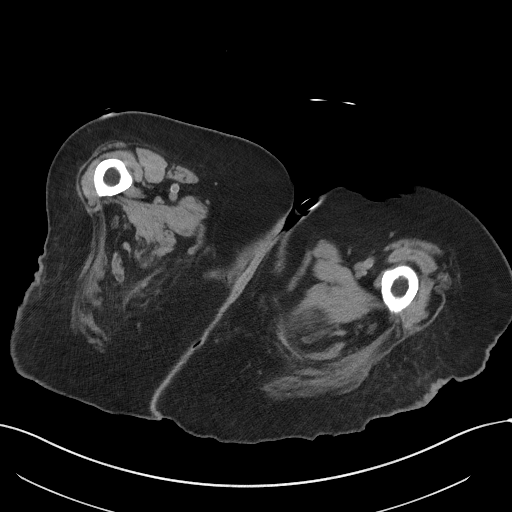
[im 5/100  bone]
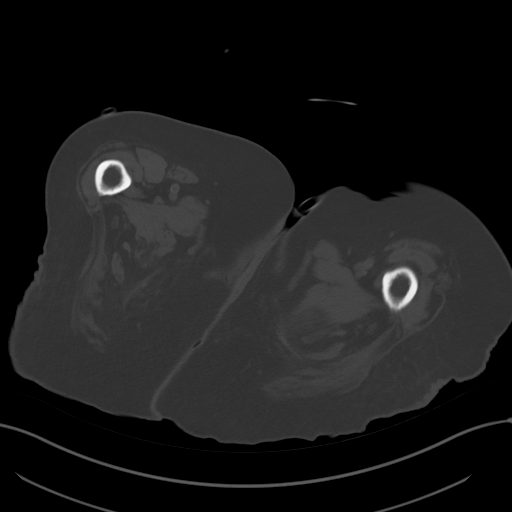
[im 14/100  soft-tissue]
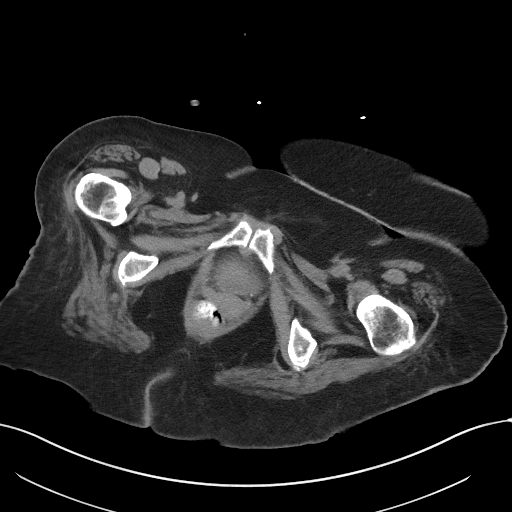
[im 23/100  soft-tissue]
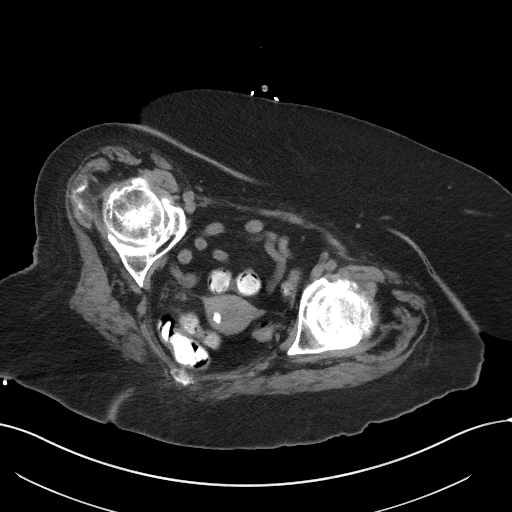
[im 32/100  soft-tissue]
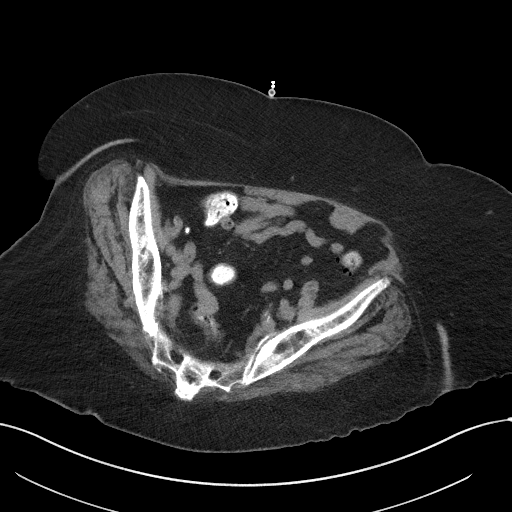
[im 37/100  soft-tissue]
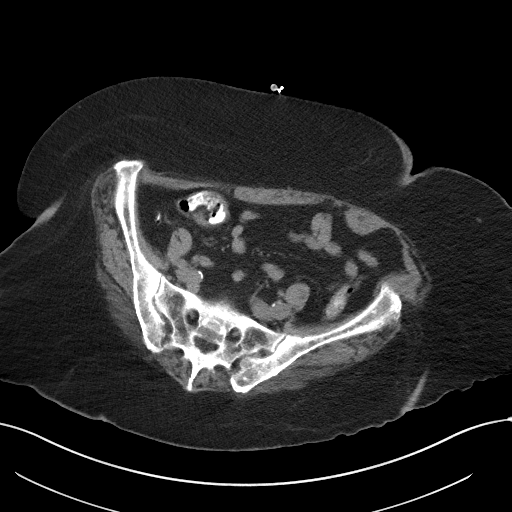
[im 46/100  soft-tissue]
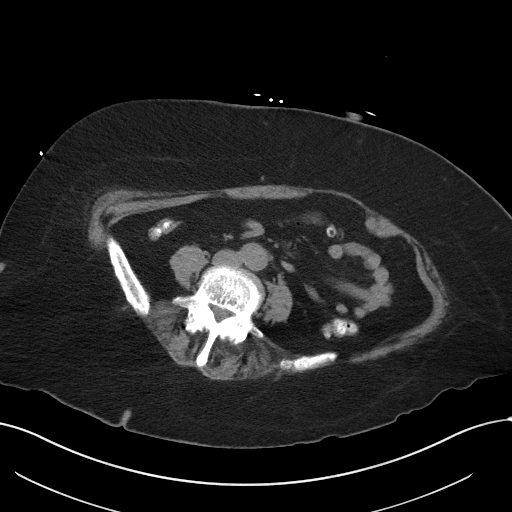
[im 55/100  soft-tissue]
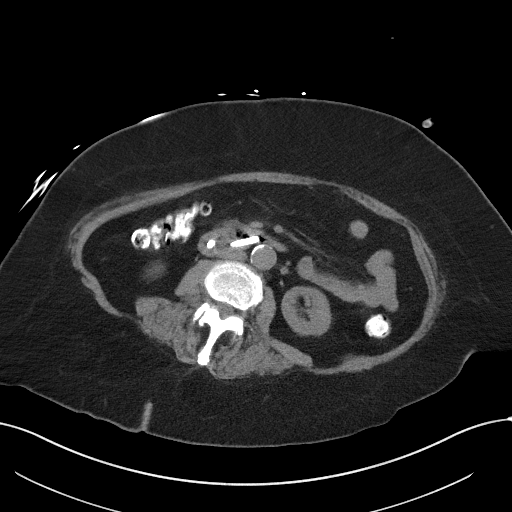
[im 64/100  soft-tissue]
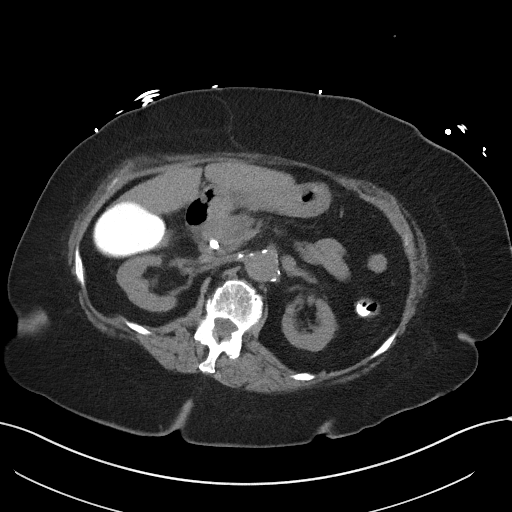
[im 68/100  soft-tissue]
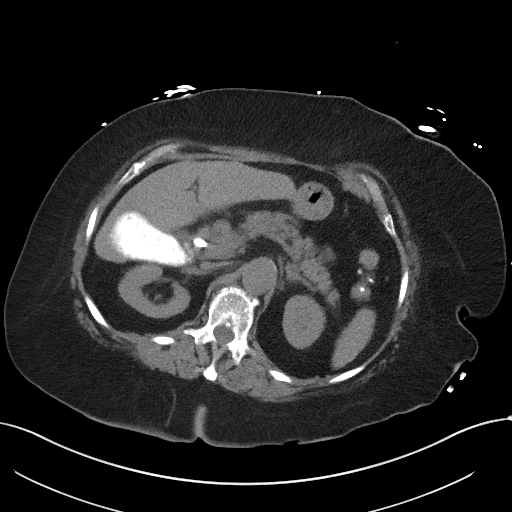
[im 68/100  bone]
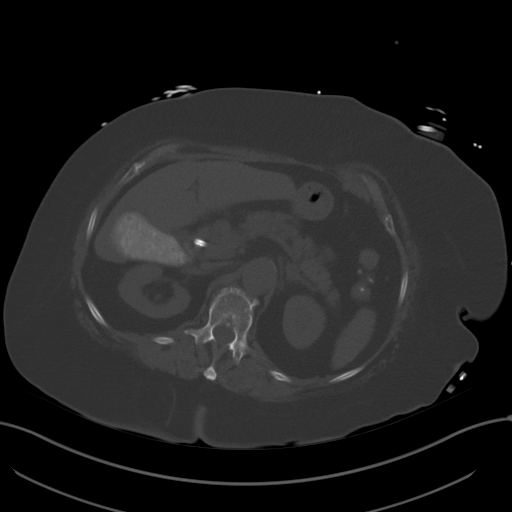
[im 77/100  soft-tissue]
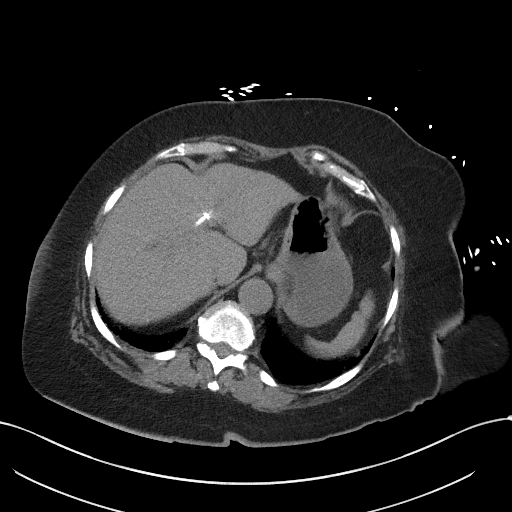
[im 86/100  soft-tissue]
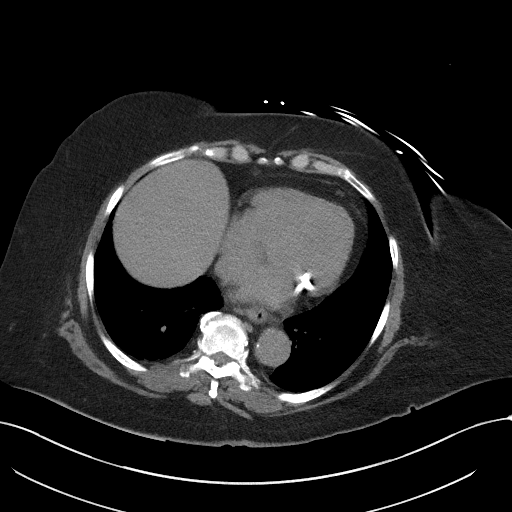
[im 95/100  soft-tissue]
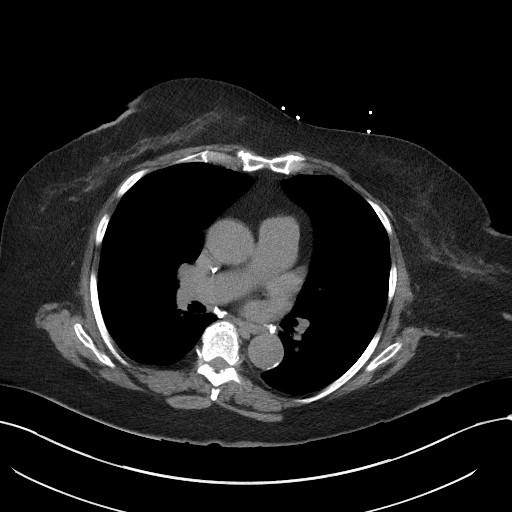

[Series 5: coronal st · coronal · 0.82mm/px · 3 of 100 slices shown]
[im 34/100  soft-tissue]
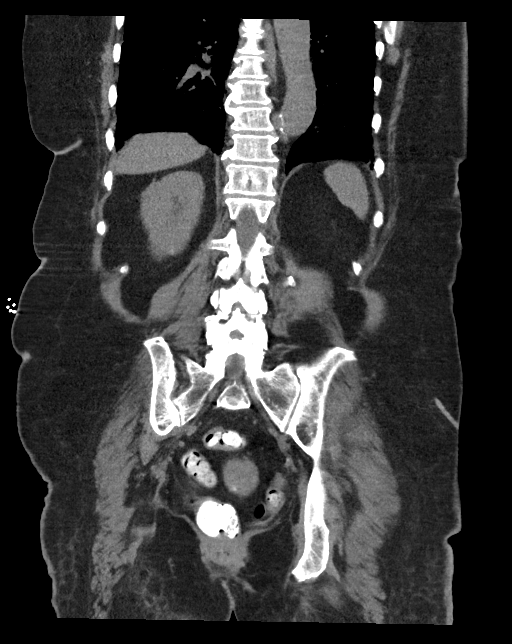
[im 45/100  soft-tissue]
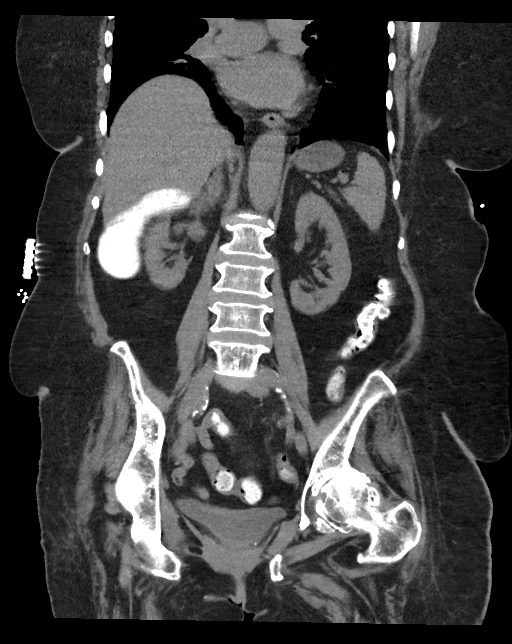
[im 56/100  soft-tissue]
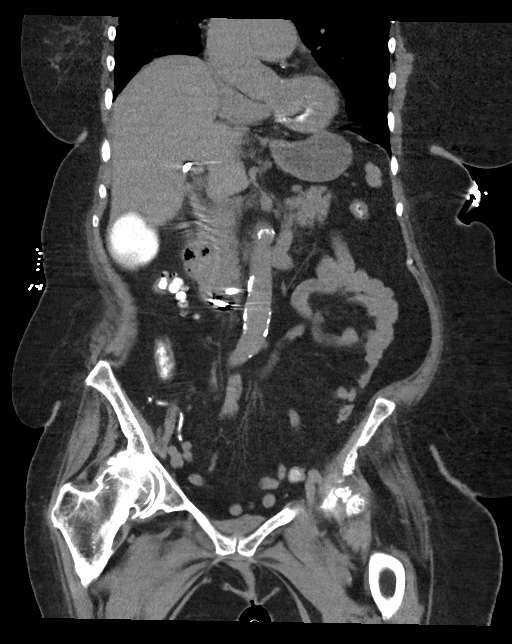

[15 of 46 positions shown; findings below may reference images not displayed]

FINDINGS: Lower chest: Nodular area along the major fissure in the RIGHT chest
stable when compared to recent PET-CT 10 x 7 mm. No effusion. No
consolidation.

Hepatobiliary: Liver with smooth contours. Percutaneous biliary
drainage catheter in place, tip in the duodenum traversing LEFT
hepatic lobe with side-hole marker well within the hepatic
substance. Side-hole marker 5 cm into the liver. No perihepatic
stranding at the site of entry.

Contrast mixed with numerous stones in the gallbladder. Gallbladder
is distended without signs of pericholecystic stranding or fluid.

No focal hepatic abnormality.

Pancreas: Pancreas with stable appearance compared to recent PET
imaging aside from potential mild peripancreatic stranding.
Ill-defined mass in the pancreatic head not grossly changed.

Spleen: Normal spleen.

Adrenals/Urinary Tract: Normal appearance of adrenal glands. No
perinephric stranding. No hydronephrosis. No perivesical stranding.
No nephrolithiasis.

Stomach/Bowel: No perianal stranding. Colonic diverticulosis without
signs of diverticulitis. Normal appendix.

Small hiatal hernia. No perigastric stranding. Small bowel is normal
in caliber. No adjacent stranding.

Vascular/Lymphatic: Calcified atheromatous plaque of the abdominal
aorta. Small lymph nodes adjacent to pancreas not well evaluated. No
new retroperitoneal adenopathy.

Insert no pelvic adenopathy.

Reproductive: No adnexal masses.

Other: No free air. No ascites. Small fat containing umbilical
hernia.

Musculoskeletal: Spinal degenerative changes. No acute or
destructive bone process. Degenerative changes of the bilateral hips
with severe degenerative change.
IMPRESSION: 1. Query mild peripancreatic stranding. Correlate with pancreatic
enzymes.
2. Percutaneous biliary drainage catheter in place, tip in the
duodenum traversing LEFT hepatic lobe with side-hole marker well
within the hepatic substance. w no acute hepatic findings on
noncontrast imaging.
3. Cholelithiasis with contrast in a distended gallbladder. No
adjacent stranding or fluid.
4. Ill-defined mass in the pancreatic head not grossly changed.
5. Colonic diverticulosis without signs of diverticulitis.
6. Nodular area along the major fissure in the RIGHT chest stable
when compared to recent PET-CT 10 x 7 mm. Attention on follow-up.
7. Aortic atherosclerosis.

Aortic Atherosclerosis (RCSH7-3D3.3).
